# Patient Record
Sex: Female | Born: 1975 | Race: White | Hispanic: Yes | Marital: Married | Smoking: Never smoker
Health system: Southern US, Community
[De-identification: ages and names within clinical notes are randomized; demographics above are authoritative.]

## PROBLEM LIST (undated history)

## (undated) DIAGNOSIS — Z789 Other specified health status: Secondary | ICD-10-CM

## (undated) DIAGNOSIS — T7840XA Allergy, unspecified, initial encounter: Secondary | ICD-10-CM

## (undated) DIAGNOSIS — K649 Unspecified hemorrhoids: Secondary | ICD-10-CM

## (undated) DIAGNOSIS — F32A Depression, unspecified: Secondary | ICD-10-CM

## (undated) HISTORY — DX: Allergy, unspecified, initial encounter: T78.40XA

## (undated) HISTORY — PX: OTHER SURGICAL HISTORY: SHX169

## (undated) HISTORY — DX: Unspecified hemorrhoids: K64.9

## (undated) HISTORY — DX: Depression, unspecified: F32.A

---

## 1999-01-01 ENCOUNTER — Other Ambulatory Visit: Admission: RE | Admit: 1999-01-01 | Discharge: 1999-01-01 | Payer: Self-pay | Admitting: Gynecology

## 1999-01-30 ENCOUNTER — Encounter: Admission: RE | Admit: 1999-01-30 | Discharge: 1999-04-30 | Payer: Self-pay | Admitting: Gynecology

## 1999-02-26 ENCOUNTER — Emergency Department (HOSPITAL_COMMUNITY): Admission: EM | Admit: 1999-02-26 | Discharge: 1999-02-26 | Payer: Self-pay | Admitting: Emergency Medicine

## 1999-02-26 ENCOUNTER — Encounter: Payer: Self-pay | Admitting: Emergency Medicine

## 1999-08-17 ENCOUNTER — Inpatient Hospital Stay (HOSPITAL_COMMUNITY): Admission: AD | Admit: 1999-08-17 | Discharge: 1999-08-19 | Payer: Self-pay | Admitting: Gynecology

## 1999-09-30 ENCOUNTER — Other Ambulatory Visit: Admission: RE | Admit: 1999-09-30 | Discharge: 1999-09-30 | Payer: Self-pay | Admitting: Gynecology

## 2000-12-25 ENCOUNTER — Other Ambulatory Visit: Admission: RE | Admit: 2000-12-25 | Discharge: 2000-12-25 | Payer: Self-pay | Admitting: Gynecology

## 2001-01-13 ENCOUNTER — Other Ambulatory Visit: Admission: RE | Admit: 2001-01-13 | Discharge: 2001-01-13 | Payer: Self-pay | Admitting: Gynecology

## 2002-03-23 ENCOUNTER — Other Ambulatory Visit: Admission: RE | Admit: 2002-03-23 | Discharge: 2002-03-23 | Payer: Self-pay | Admitting: Gynecology

## 2003-06-20 ENCOUNTER — Other Ambulatory Visit: Admission: RE | Admit: 2003-06-20 | Discharge: 2003-06-20 | Payer: Self-pay | Admitting: Gynecology

## 2003-08-23 ENCOUNTER — Other Ambulatory Visit: Admission: RE | Admit: 2003-08-23 | Discharge: 2003-08-23 | Payer: Self-pay | Admitting: Gynecology

## 2004-03-25 ENCOUNTER — Inpatient Hospital Stay (HOSPITAL_COMMUNITY): Admission: AD | Admit: 2004-03-25 | Discharge: 2004-03-28 | Payer: Self-pay | Admitting: Gynecology

## 2004-05-03 ENCOUNTER — Other Ambulatory Visit: Admission: RE | Admit: 2004-05-03 | Discharge: 2004-05-03 | Payer: Self-pay | Admitting: Gynecology

## 2004-06-25 ENCOUNTER — Observation Stay (HOSPITAL_COMMUNITY): Admission: AD | Admit: 2004-06-25 | Discharge: 2004-06-26 | Payer: Self-pay | Admitting: Gynecology

## 2004-06-25 ENCOUNTER — Ambulatory Visit (HOSPITAL_BASED_OUTPATIENT_CLINIC_OR_DEPARTMENT_OTHER): Admission: RE | Admit: 2004-06-25 | Discharge: 2004-06-25 | Payer: Self-pay | Admitting: Gynecology

## 2005-05-21 ENCOUNTER — Other Ambulatory Visit: Admission: RE | Admit: 2005-05-21 | Discharge: 2005-05-21 | Payer: Self-pay | Admitting: Gynecology

## 2006-03-23 ENCOUNTER — Ambulatory Visit (HOSPITAL_COMMUNITY): Admission: RE | Admit: 2006-03-23 | Discharge: 2006-03-23 | Payer: Self-pay | Admitting: Internal Medicine

## 2008-08-18 ENCOUNTER — Ambulatory Visit (HOSPITAL_COMMUNITY): Admission: RE | Admit: 2008-08-18 | Discharge: 2008-08-18 | Payer: Self-pay | Admitting: Internal Medicine

## 2010-09-08 ENCOUNTER — Encounter: Payer: Self-pay | Admitting: Internal Medicine

## 2011-01-03 NOTE — Discharge Summary (Signed)
NAMESAUDIA, SMYSER                          ACCOUNT NO.:  [id number]   MEDICAL RECORD NO.:  [id number]                   PATIENT TYPE:  INP   LOCATION:  9115                                 FACILITY:  [facility]   PHYSICIAN:  Juan H. Lily Peer, M.D.             DATE OF BIRTH:  [birth year]   DATE OF ADMISSION:  03/25/2004  DATE OF DISCHARGE:                                 DISCHARGE SUMMARY   HISTORY:  The patient is a 35 year old gravida 4 para 2 AB 1 at 40-and-a-  half weeks estimated gestational age who was admitted on March 26, 2004; was  in early labor and was augmented with Pitocin.  On the morning of August 9  her cervix was 3-4 cm, 80% effaced, -2 station.  She underwent artificial  rupture of membranes with clear amniotic fluid.  She had a GBS culture which  was negative.  She went on to have a normal spontaneous vaginal delivery on  the morning of August 9 at 1010, a viable female infant with Apgars of 9 and  10, with a weight of 8 pounds 10 ounces and length of 54 cm.  The patient's  postpartum course was uneventful.  Her blood type was B positive, rubella  immune.  Hemoglobin and hematocrit were 12.7 and 38.1 respectively with a  platelet count of 207,000.  She was ready to be discharged home on  postpartum day #2.  She was complaining of a little bit of pruritus as a  result of perhaps the Duramorph and she was given Benadryl 25 mg p.o. before  being discharged home.   FINAL DISPOSITION AND FOLLOW-UP:  A 35 year old gravida 4, now para 3 AB 1  status post normal spontaneous vaginal delivery, uneventful postpartum  course, was ready for discharge home.  Prescription for Motrin 800 mg to  take one p.o. t.i.d. was prescribed as well as prenatal chewable vitamins.  She will make an appointment to follow up with [facility] in 6 weeks for a  postpartum visit.                                               Juan H. Lily Peer, M.D.    JHF/MEDQ  D:  03/28/2004  T:  03/28/2004  Job:  [id number]

## 2011-01-24 ENCOUNTER — Other Ambulatory Visit: Payer: Self-pay | Admitting: Specialist

## 2011-01-24 DIAGNOSIS — R102 Pelvic and perineal pain: Secondary | ICD-10-CM

## 2011-01-27 ENCOUNTER — Other Ambulatory Visit: Payer: Self-pay

## 2011-01-28 ENCOUNTER — Ambulatory Visit
Admission: RE | Admit: 2011-01-28 | Discharge: 2011-01-28 | Disposition: A | Discharge status: Home / Self Care | Payer: Self-pay | Source: Ambulatory Visit | Attending: Specialist | Admitting: Specialist

## 2011-01-28 ENCOUNTER — Ambulatory Visit
Admission: RE | Admit: 2011-01-28 | Discharge: 2011-01-28 | Disposition: A | Discharge status: Home / Self Care | Payer: No Typology Code available for payment source | Source: Ambulatory Visit | Attending: Specialist | Admitting: Specialist

## 2011-01-28 DIAGNOSIS — R102 Pelvic and perineal pain: Secondary | ICD-10-CM

## 2011-06-26 ENCOUNTER — Encounter (HOSPITAL_COMMUNITY): Payer: Self-pay

## 2011-06-26 ENCOUNTER — Inpatient Hospital Stay (HOSPITAL_COMMUNITY)
Admission: AD | Admit: 2011-06-26 | Discharge: 2011-06-26 | Disposition: A | Discharge status: Home / Self Care | Payer: Self-pay | Source: Ambulatory Visit | Attending: Obstetrics | Admitting: Obstetrics

## 2011-06-26 ENCOUNTER — Inpatient Hospital Stay (HOSPITAL_COMMUNITY): Payer: Self-pay

## 2011-06-26 DIAGNOSIS — O209 Hemorrhage in early pregnancy, unspecified: Secondary | ICD-10-CM | POA: Insufficient documentation

## 2011-06-26 HISTORY — DX: Other specified health status: Z78.9

## 2011-06-26 LAB — URINE MICROSCOPIC-ADD ON

## 2011-06-26 LAB — WET PREP, GENITAL
Clue Cells Wet Prep HPF POC: NONE SEEN
Trich, Wet Prep: NONE SEEN
Yeast Wet Prep HPF POC: NONE SEEN

## 2011-06-26 LAB — CBC
HCT: 38.7 % (ref 36.0–46.0)
Hemoglobin: 13.1 g/dL (ref 12.0–15.0)
MCH: 30.3 pg (ref 26.0–34.0)
MCHC: 33.9 g/dL (ref 30.0–36.0)
MCV: 89.4 fL (ref 78.0–100.0)
RDW: 13.5 % (ref 11.5–15.5)

## 2011-06-26 LAB — URINALYSIS, ROUTINE W REFLEX MICROSCOPIC
Bilirubin Urine: NEGATIVE
Glucose, UA: NEGATIVE mg/dL
Protein, ur: NEGATIVE mg/dL
Urobilinogen, UA: 0.2 mg/dL (ref 0.0–1.0)

## 2011-06-26 LAB — POCT PREGNANCY, URINE: Preg Test, Ur: POSITIVE

## 2011-06-26 NOTE — Progress Notes (Signed)
Pt informed of Dr. Ambrose Mantle  plan of care to admitted to hospital and monitor b/p pt does not want to be admitted. Informed pt that with b/p elevated,there is chance that the baby and herself could be in danger and  possibly die. Pt verbalized understanding but states "I can't stay in the hospital I have to work." Pt states she would like a minute to discuss with the FOB.

## 2011-06-26 NOTE — Progress Notes (Signed)
Patient states she started having some pink spotting with wiping at 0500 this am. No blood on a pad and patient states no pain.

## 2011-06-26 NOTE — ED Provider Notes (Signed)
History   Pt presents today c/o vag spotting that she first noticed this am. She denies abd pain, vag dc, irritation, or recent intercourse. She has no other complaints at this time.  Chief Complaint  Patient presents with  . Vaginal Bleeding   HPI  OB History    Grav Para Term Preterm Abortions TAB SAB Ect Mult Living   5 3   1  1   3       No past medical history on file.  No past surgical history on file.  No family history on file.  History  Substance Use Topics  . Smoking status: Not on file  . Smokeless tobacco: Not on file  . Alcohol Use: Not on file    Allergies: No Known Allergies  Prescriptions prior to admission  Medication Sig Dispense Refill  . guaifenesin (ROBITUSSIN) 100 MG/5ML syrup Take 200 mg by mouth every 6 (six) hours as needed. cough       . prenatal vitamin w/FE, FA (PRENATAL 1 + 1) 27-1 MG TABS Take 1 tablet by mouth daily.        . pseudoephedrine (SUDAFED) 30 MG tablet Take 30 mg by mouth every 8 (eight) hours as needed. congestion       . sodium chloride (OCEAN) 0.65 % nasal spray Place 1 spray into the nose as needed. congestion         Review of Systems  Constitutional: Negative for fever.  Cardiovascular: Negative for chest pain.  Gastrointestinal: Negative for nausea, vomiting, abdominal pain, diarrhea and constipation.  Genitourinary: Negative for dysuria, urgency, frequency and hematuria.  Neurological: Negative for dizziness and headaches.  Psychiatric/Behavioral: Negative for depression and suicidal ideas.   Physical Exam   Blood pressure 128/70, pulse 76, temperature 98.1 F (36.7 C), temperature source Oral, resp. rate 20, height 5' 0.5" (1.537 m), weight 160 lb 6.4 oz (72.757 kg), last menstrual period 04/04/2011, SpO2 99.00%.  Physical Exam  Nursing note and vitals reviewed. Constitutional: She is oriented to person, place, and time. She appears well-developed and well-nourished. No distress.  HENT:  Head: Normocephalic and  atraumatic.  Eyes: EOM are normal. Pupils are equal, round, and reactive to light.  GI: Soft. She exhibits no distension. There is no tenderness. There is no rebound and no guarding.  Genitourinary: There is bleeding around the vagina. Vaginal discharge found.       Cervix Lg/closed.  Neurological: She is alert and oriented to person, place, and time.  Skin: Skin is warm and dry. She is not diaphoretic.  Psychiatric: She has a normal mood and affect. Her behavior is normal. Judgment and thought content normal.    MAU Course  Procedures  Wet prep and GC/Chlamydia cultures done.  Results for orders placed during the hospital encounter of 06/26/11 (from the past 24 hour(s))  URINALYSIS, ROUTINE W REFLEX MICROSCOPIC     Status: Abnormal   Collection Time   06/26/11  2:02 PM      Component Value Range   Color, Urine STRAW (*) YELLOW    Appearance HAZY (*) CLEAR    Specific Gravity, Urine <1.005 (*) 1.005 - 1.030    pH 6.0  5.0 - 8.0    Glucose, UA NEGATIVE  NEGATIVE (mg/dL)   Hgb urine dipstick MODERATE (*) NEGATIVE    Bilirubin Urine NEGATIVE  NEGATIVE    Ketones, ur NEGATIVE  NEGATIVE (mg/dL)   Protein, ur NEGATIVE  NEGATIVE (mg/dL)   Urobilinogen, UA 0.2  0.0 - 1.0 (  mg/dL)   Nitrite NEGATIVE  NEGATIVE    Leukocytes, UA NEGATIVE  NEGATIVE   URINE MICROSCOPIC-ADD ON     Status: Abnormal   Collection Time   06/26/11  2:02 PM      Component Value Range   Squamous Epithelial / LPF MANY (*) RARE    RBC / HPF 3-6  <3 (RBC/hpf)  POCT PREGNANCY, URINE     Status: Normal   Collection Time   06/26/11  2:34 PM      Component Value Range   Preg Test, Ur POSITIVE    CBC     Status: Normal   Collection Time   06/26/11  3:19 PM      Component Value Range   WBC 6.5  4.0 - 10.5 (K/uL)   RBC 4.33  3.87 - 5.11 (MIL/uL)   Hemoglobin 13.1  12.0 - 15.0 (g/dL)   HCT 04.5  40.9 - 81.1 (%)   MCV 89.4  78.0 - 100.0 (fL)   MCH 30.3  26.0 - 34.0 (pg)   MCHC 33.9  30.0 - 36.0 (g/dL)   RDW 91.4   78.2 - 95.6 (%)   Platelets 229  150 - 400 (K/uL)  ABO/RH     Status: Normal   Collection Time   06/26/11  3:19 PM      Component Value Range   ABO/RH(D) B POS    HCG, QUANTITATIVE, PREGNANCY     Status: Abnormal   Collection Time   06/26/11  3:19 PM      Component Value Range   hCG, Beta Chain, Quant, S 8910 (*) <5 (mIU/mL)  WET PREP, GENITAL     Status: Abnormal   Collection Time   06/26/11  4:00 PM      Component Value Range   Yeast, Wet Prep NONE SEEN  NONE SEEN    Trich, Wet Prep NONE SEEN  NONE SEEN    Clue Cells, Wet Prep NONE SEEN  NONE SEEN    WBC, Wet Prep HPF POC FEW (*) NONE SEEN    US shows irregular gestational sac with associated decidual reaction but no yolk sac or fetal pole seen. Suspicious for blighted ovum.  Assessment and Plan  Bleeding in preg: discussed with pt at length. Will have pt return in 2 days for repeat B-hcg. Discussed diet, activity, risks, and precautions.  Clinton Gallant. Leor Whyte III, DrHSc, MPAS, PA-C  06/26/2011, 3:56 PM   Henrietta Hoover, PA 06/26/11 1616

## 2011-06-28 ENCOUNTER — Inpatient Hospital Stay (HOSPITAL_COMMUNITY)
Admission: AD | Admit: 2011-06-28 | Discharge: 2011-06-28 | Disposition: A | Discharge status: Home / Self Care | Payer: No Typology Code available for payment source | Source: Ambulatory Visit | Attending: Obstetrics | Admitting: Obstetrics

## 2011-06-28 DIAGNOSIS — O021 Missed abortion: Secondary | ICD-10-CM | POA: Insufficient documentation

## 2011-06-28 NOTE — Progress Notes (Signed)
Patient reports still bleeding noted when goes to the bathroom some pain in the back area here for follow up blood work.

## 2011-06-28 NOTE — ED Provider Notes (Signed)
History     CSN: [id number] Arrival date & time: 06/28/2011  7:36 AM   None     Chief Complaint  Patient presents with  . Vaginal Bleeding    (Consider location/radiation/quality/duration/timing/severity/associated sxs/prior treatment) HPIElisabeth N Coffey  Is a 35 y.o. [id number]. She returns for repeat BHCG. First MAU visit 11/8 for spotting. BHCG was 8910. U/S revealed 8 3/7 wk irregular IUGS, no yolk sac or fetal pole. Blighted ovum suspected. Pt returns today, still has scant bleeding, no pain or cramping.  Past Medical History  Diagnosis Date  . No pertinent past medical history     Past Surgical History  Procedure Date  . Left ovarian cyst     No family history on file.  History  Substance Use Topics  . Smoking status: Not on file  . Smokeless tobacco: Not on file  . Alcohol Use: No    OB History    Grav Para Term Preterm Abortions TAB SAB Ect Mult Living   5 3   1  1   3       Review of Systems  Allergies  Review of patient's allergies indicates no known allergies.  Home Medications  No current outpatient prescriptions on file.  BP 114/70  Pulse 67  Temp(Src) 98.7 F (37.1 C) (Oral)  Resp 16  LMP 04/04/2011  Physical Exam WD WN HF VSS  ED Course  Procedures (including critical care time)  Labs Reviewed  HCG, QUANTITATIVE, PREGNANCY - Abnormal; Notable for the following:    hCG, Beta Chain, Quant, S 8089 (*)    All other components within normal limits   US Ob Comp Less 14 Wks  06/26/2011  OBSTETRICAL ULTRASOUND: This exam was performed within a [facility]. The OB US report was generated in the [facility] system, and faxed to the ordering physician.   This report is also available in [employer] and in the [employer]. See [facility] Obstetric US report.   US Ob Transvaginal  06/26/2011  OBSTETRICAL ULTRASOUND: This exam was performed within a [facility]. The OB US report was  generated in the [facility] system, and faxed to the ordering physician.   This report is also available in [employer] and in the [employer]. See [facility] Obstetric US report.     No diagnosis found.   Results discussed with the pt. BHCG went from 8910 to 8089. Course of miscarriage reviewed. Pt to return with heavy bleeding, pain or fever >100.5.  Will get results to Dr Elsie Stain office and he can schedule her for [facility]. Pt is in agreement with this plan. MDM          Avon Gully. Hayden Mabin 06/28/11 1478

## 2012-03-15 ENCOUNTER — Other Ambulatory Visit (HOSPITAL_COMMUNITY): Payer: Self-pay | Admitting: Geriatric Medicine

## 2012-03-15 DIAGNOSIS — Z1231 Encounter for screening mammogram for malignant neoplasm of breast: Secondary | ICD-10-CM

## 2012-04-01 ENCOUNTER — Encounter (HOSPITAL_COMMUNITY): Payer: Self-pay

## 2012-04-01 ENCOUNTER — Ambulatory Visit (HOSPITAL_COMMUNITY)
Admission: RE | Admit: 2012-04-01 | Discharge: 2012-04-01 | Disposition: A | Discharge status: Home / Self Care | Payer: Self-pay | Source: Ambulatory Visit | Attending: Geriatric Medicine | Admitting: Geriatric Medicine

## 2012-04-01 DIAGNOSIS — Z1231 Encounter for screening mammogram for malignant neoplasm of breast: Secondary | ICD-10-CM

## 2012-07-07 ENCOUNTER — Ambulatory Visit (INDEPENDENT_AMBULATORY_CARE_PROVIDER_SITE_OTHER): Payer: Self-pay | Admitting: Family Medicine

## 2012-07-07 ENCOUNTER — Encounter: Payer: Self-pay | Admitting: Family Medicine

## 2012-07-07 VITALS — BP 124/74 | Wt 149.0 lb

## 2012-07-07 DIAGNOSIS — O09519 Supervision of elderly primigravida, unspecified trimester: Secondary | ICD-10-CM

## 2012-07-07 DIAGNOSIS — O0990 Supervision of high risk pregnancy, unspecified, unspecified trimester: Secondary | ICD-10-CM | POA: Insufficient documentation

## 2012-07-07 DIAGNOSIS — Z23 Encounter for immunization: Secondary | ICD-10-CM

## 2012-07-07 DIAGNOSIS — Z3480 Encounter for supervision of other normal pregnancy, unspecified trimester: Secondary | ICD-10-CM

## 2012-07-07 DIAGNOSIS — Z1151 Encounter for screening for human papillomavirus (HPV): Secondary | ICD-10-CM

## 2012-07-07 DIAGNOSIS — Z124 Encounter for screening for malignant neoplasm of cervix: Secondary | ICD-10-CM

## 2012-07-07 DIAGNOSIS — Z113 Encounter for screening for infections with a predominantly sexual mode of transmission: Secondary | ICD-10-CM

## 2012-07-07 MED ORDER — INFLUENZA VIRUS VACC SPLIT PF IM SUSP: 0.5000 mL | Freq: Once | INTRAMUSCULAR | Status: DC

## 2012-07-07 NOTE — Patient Instructions (Signed)
Embarazo  Primer trimestre  (Pregnancy - First Trimester)  Durante el acto sexual, millones de espermatozoides entran en la vagina. Slo 1 espermatozoide penetra y fertiliza al vulo mientras se encuentra en la trompa de [place]. Una semana ms tarde, el vulo fertilizado se implanta en la pared del tero. Un embrin comienza a desarrollarse para ser un beb. A las 6 a 8 semanas se forman los ojos y la cara y los latidos del corazn se pueden ver en la ecografa. Al final de las 12 semanas (primer trimestre) todos los rganos del beb estn formados. Ahora que est embarazada, querr hacer todo lo que est a su alcance para tener un beb sano. Dos de las cosas ms importantes son: tener una buena atencin prenatal y seguir las indicaciones del profesional que la asiste. La atencin prenatal incluye toda la asistencia mdica que usted recibe antes del nacimiento del beb. Se lleva a cabo para prevenir y tratar problemas durante el embarazo y el parto. EXAMENES PRENATALES   Durante las visitas prenatales se controlan el peso, la presin arterial y se solicitan anlisis de orina. Esto se hace para asegurarse de que usted est sana y el embarazo progrese normalmente.  Una mujer embarazada debe aumentar de 25 a 35 libras durante el embarazo. Sin embargo, si usted tiene sobrepeso o bajo peso, su mdico le aconsejar qu hacer.  El podr hacerle preguntas y responder todas las que usted le haga.  Durante los exmenes prenatales se solicitan anlisis de sangre, cultivos del tero, un [place] y otros anlisis necesarios. Estas pruebas se realizan para controlar su salud y la del beb. Se recomienda que se haga la prueba para el diagnstico del VIH, con su autorizacin. Este es el virus que causa el SIDA. Estas pruebas se realizan porque existen medicamentos que podran administrarle para prevenir que el beb nazca con esta infeccin, si usted estuviera infectada y no lo supiera. Los anlisis de sangre tambin  se realizan para determinar el tipo de sangre, si tuvo infecciones previas y controlar sus niveles en la sangre (hemoglobina).  Tener un recuento bajo de hemoglobina (anemia) es comn durante el embarazo. Para prevenirla, se administran hierro y vitaminas. En una etapa ms avanzada del embarazo, le indicarn exmenes de sangre para saber si tiene diabetes, junto con otros anlisis, en caso de que tuviera problemas. Es necesario que se haga las pruebas para asegurarse de que usted y el beb estn bien.  Es posible que necesite otras pruebas adicionales. CAMBIOS DURANTE EL PRIMER TRIMESTRE (LOS TRES PRIMEROS MESES DEL EMBARAZO).  Su organismo atravesar numerosos cambios durante el embarazo. Estos pueden variar de una persona a otra. Converse con el mdico acerca los cambios que usted nota y que la preocupan. Ellos son:   El perodo menstrual se detiene.  El vulo y los espermatozoides llevan los genes que determinan cmo seremos. Sus genes y los de su pareja forman el beb. Los genes del varn determinan si ser un nio o una nia.  La circunferencia de la cintura va a ir aumentando y podr sentirse hinchada.  Puede tener malestar estomacal (nuseas) y vmitos. Si no puede controlar los vmitos, consulte a su mdico.  Sus mamas comenzarn a agrandarse y sensibilizarse.  Los pezones pueden sobresalir ms y ser ms oscuros.  Tendr necesidad de orinar ms. El dolor al orinar puede significar que usted tiene una infeccin de la vejiga.  Se cansar con facilidad.  Prdida del apetito.  Sentir un fuerte deseo de consumir ciertos alimentos.    Al principio, usted puede ganar o perder un par de kilos.  Podr tener cambios emocionales de un da a otro (entusiasmo por estar embarazada o preocupacin por el embarazo y el beb).  Tendr sueos ms vvidos y extraos. INSTRUCCIONES PARA EL CUIDADO EN EL HOGAR   Es muy importante evitar el cigarrillo, el alcohol y los frmacos no recetados  durante el embarazo. Estas sustancias afectan la formacin y el desarrollo del beb. Evite los productos qumicos durante el embarazo para asegurar la salud del beb.  Comience las consultas prenatales alrededor de la 12 semana de embarazo. Generalmente se programan cada mes al principio y se hacen ms frecuentes en los 2 ltimos meses antes del parto. Cumpla con las citas de control. Siga las indicaciones del mdico con respecto al uso de medicamentos, los anlisis y pruebas de laboratorio, los ejercicios y la dieta.  Durante el embarazo debe obtener nutrientes para usted y para su beb. Consuma alimentos balanceados. Elija alimentos como carne, pescado, leche y otros productos lcteos descremados, vegetales, frutas, panes integrales y cereales. El mdico le informar cul es el aumento de peso ideal.  Las nuseas matinales pueden aliviarse si come algunas galletitas saladas en la cama. Coma dos galletitas antes de levantarse por la maana. Tambin puede comer galletitas sin sal.  Hacer 4 o 5 comidas pequeas en lugar de 3 comidas grandes por da tambin puede aliviar las nuseas y los vmitos.  Beber lquidos entre las comidas en lugar de tomarlos durante las comidas tambin puede ayudar a calmar las nuseas y los vmitos.  Puede continuar teniendo relaciones sexuales durante todo el embarazo si no hay otros problemas. Los problemas pueden ser una prdida precoz (prematura) de lquido amnitico, sangrado vaginal, o dolor en el vientre (abdominal).  Realice actividad fsica todos los das, si no tiene restricciones. Consulte con su mdico o terapeuta fsico si no est segura de algunos de sus ejercicios. El mayor aumento de peso se producir en los ltimos 2 trimestres del embarazo. El ejercicio le ayudar a:  Controlar su peso.  Mantenerse en forma.  Prepararse para el parto.  La ayudar a perder el peso del embarazo despus de que nazca su beb.  Use un buen sostn o como los que se usan  para hacer deportes para aliviar la sensibilidad de las mamas. Tambin puede serle til si lo usa mientras duerme.  Consulte cuando puede comenzar con las clases de pre parto. Comience con las clases cuando estn disponibles.  No utilice la baera con agua caliente, baos turcos y saunas.  Colquese el cinturn de seguridad cuando conduzca. Este la proteger a usted y al beb en caso de accidente.  Evite comer carne cruda y el contacto con los utensilios y desperdicios de los gatos. Estos elementos contienen grmenes que pueden causar defectos de nacimiento en el beb.  El primer trimestre es un buen momento para visitar a su dentista y evaluar su salud dental. Es importante mantener los dientes limpios. Use un cepillo de dientes suave y cepllese con ms suavidad durante el embarazo.  Pida ayuda si tienen necesidades financieras, teraputicas o nutricionales. El profesional podr ayudarla con respecto a estas necesidades, o derivarla a otros especialistas.  No tome medicamentos o hierbas excepto aquellos que le indic el profesional.  Informe a su mdico si sufre violencia familiar mental o fsica.  Haga una lista de nmeros de telfono de emergencia de la familia, los amigos, el hospital y los departamentos de polica y bomberos.  Escriba sus   preguntas. Llvelas cuando concurra a su visita prenatal.  No se haga duchas vaginales.  No cruce las piernas.  Si usted tiene que estar parada por largos perodos de tiempo, gire los pies o de pequeos pasos en crculo.  Es posible que tenga ms secreciones vaginales que puedan requerir una toalla higinica. No use tampones o toallas higinicas perfumadas. EL CONSUMO DE MEDICAMENTOS Y FRMACOS DURANTE EL EMBARAZO   Tome las vitaminas para la etapa prenatal tal como se le indic. Las vitaminas deben contener un miligramo de cido flico. Guarde todas las vitaminas fuera del alcance de los nios. La ingestin de slo un par de vitaminas o  tabletas que contengan hierro pueden ocasionar la muerte en un beb o en un nio pequeo.  Evite el uso de todos los medicamentos, incluyendo hierbas, medicamentos de venta libre, sin receta o que no hayan sido sugeridos por su mdico. Slo tome medicamentos de venta libre o medicamentos recetados para el dolor, el malestar o fiebre como lo indique su mdico. No tome aspirina, ibuprofeno o naproxeno excepto que su mdico se lo indique.  Infrmele al profesional si consume medicamentos de hierbas.  El alcohol se relaciona con ciertos defectos congnitos. Incluye el sndrome de alcoholismo fetal. Debe evitar absolutamente el consumo de alcohol, en cualquier forma. El fumar causar baja tasa de natalidad y bebs prematuros.  Las drogas ilegales o de la calle son muy perjudiciales para el beb. Estn absolutamente prohibidas. Un beb que nace de una madre adicta, ser adicto al nacer. Ese beb tendr los mismos sntomas de abstinencia que un adulto.  Informe a su mdico acerca de los medicamentos que ha tomado y el motivo por el que los tom. EL ABORTO ESPONTNEO ES COMN DURANTE EL EMBARAZO  Esto no significa que haya hecho algo mal. No es un motivo para preocuparse en caso de un nuevo embarazo. El profesional que la asiste la ayudar si tiene preguntas para formular. Si tiene un aborto espontneo podr requerir una ciruga menor.  SOLICITE ATENCIN MDICA SI:  Tiene preguntas o preocupaciones relacionadas con el embarazo. Es mejor que llame para formular las preguntas si no puede esperar hasta la prxima visita, que sentirse preocupada por ellas.  SOLICITE ATENCIN MDICA DE INMEDIATO SI:   La temperatura oral le sube a ms de 102 F (38.9 C) o lo que su mdico le indique.  Tiene una prdida de lquido por la vagina (canal de parto). Si sospecha una ruptura de las membranas, tmese la temperatura y llame al profesional para informarlo sobre esto.  Observa unas pequeas manchas o una hemorragia  vaginal. Notifique al profesional acerca de la cantidad y de cuntos apsitos est utilizando.  Presenta un olor desagradable en la secrecin vaginal y observa un cambio en el color.  Contina con las nuseas y no obtiene alivio de los remedios indicados. Vomita sangre o algo similar a la borra del caf.  Pierde ms de 2 libras (1 Kg) en una semana.  Aumenta ms de 1 Kg en una semana y nota el rostro, las manos, los pies o las piernas hinchados.  Aumenta ms de 2,5 Kg en una semana (aunque no tenga las manos, pies, piernas o el rostro hinchados).  Ha estado expuesta a la rubola y no ha sufrido la enfermedad.  Ha estado expuesta a la quinta enfermedad o a la varicela.  Siente dolor en el vientre (abdominal). Las molestias en el ligamento redondo son una causa benigna frecuente de dolor abdominal durante el embarazo. El   profesional que la asiste deber evaluarla.  Presenta dolor de Renne Musca, diarrea, dolor al orinar o le falta la respiracin.  Se cae, se ve involucrada en un accidente automovilstico o sufre algn tipo de traumatismo.  En su hogar hay violencia mental o fsica. Document Released: 05/14/2005 Document Revised: 02/03/2012 [facility] Patient Information 2013 [place], [place].  Lactancia materna  (Breastfeeding) Decidir Museum/gallery exhibitions officer es una de las mejores elecciones que puede hacer por usted y su beb. La informacin que se brinda a Psychologist, clinical dar una breve visin de los beneficios de la lactancia materna as como de las dudas ms frecuentes alrededor de ella.  LOS BENEFICIOS DE AMAMANTAR  Para el beb   La primera leche (calostro ) ayuda al mejor funcionamiento del sistema digestivo del beb.   La leche tiene anticuerpos que provienen de la madre y que ayudan a prevenir las infecciones en el beb.   El beb tiene una menor incidencia de asma, alergias y del sndrome de muerte sbita del lactante (SMSL).   Los nutrientes en la Dresser materna son  mejores para el beb que los preparados para lactantes y la [place] materna ayuda a un mejor desarrollo del cerebro del beb.   Los bebs amamantados tienen menos gases, clicos y estreimiento.  Para la mam   La lactancia materna favorece el desarrollo de un vnculo muy especial entre la madre y el beb.   Es ms conveniente, siempre disponible y a Landscape architect y Film/video editor.   Consume caloras en la madre y la ayuda a perder el peso ganado durante el [place].   Favorece la contraccin del tero a su tamao normal, de manera ms rpida y [employer] las hemorragias luego del [place].   Las [employer] que amamantan tienen menor riesgo de Geophysical data processor de mama.  FRECUENCIA DEL AMAMANTAMIENTO   Un beb sano, nacido a trmino, puede amamantarse con tanta frecuencia como cada hora, o espaciar las comidas cada tres horas.   Observe al beb cuando manifieste signos de hambre. Amamante a su beb si muestra signos de hambre. Esta frecuencia variar de un beb a otro.   Amamntelo tan seguido como el beb lo solicite, o cuando usted sienta la necesidad de Paramedic sus [place].   Despierte al beb si han pasado 3  4 horas desde la ltima comida.   El amamantamiento frecuente la ayudar a producir ms [place] y a Education officer, community de Engineer, mining en los pezones e hinchazn de las [place].  POSICIN DEL BEBE PARA EL AMAMANTAMIENTO   Ya sea que se encuentre [place] o sentada, asegrese que el abdomen del beb enfrente el suyo.   Sostenga la mama con el pulgar por arriba y los otros 4 dedos por debajo. Asegrese que sus dedos se encuentren lejos del pezn y de la boca del beb.   Empuje suavemente los labios del beb con el pezn o con el dedo.   Cuando la boca del beb se abra lo suficiente, introduzca el pezn y la areola tanto como le sea posible dentro de la boca.   Coloque al beb cerca suyo de modo que su nariz y mejillas toquen las mamas al [employer].  ALIMENTACIN Y SUCCIN    La duracin de cada comida vara de un beb a otro y de Neomia Dear comida a [place].   El beb debe succionar entre 2 y 3 minutos para que Development worker, international aid. Esto se denomina "bajada". Por este motivo, permita que el nio se alimente en cada mama todo lo que  desee. Terminar de mamar cuando haya recibido la cantidad [place] de nutrientes.   Para detener la succin coloque su dedo en la comisura de la boca del nio y Midwife entre sus encas antes de quitarle la mama de la boca. Esto la ayudar a English as a second language teacher.  COMO SABER SI EL BEB OBTIENE LA SUFICIENTE LECHE MATERNA  Preguntarse si el beb obtiene la cantidad suficiente de [place] es una preocupacin frecuente [employer]. Puede asegurarse que el beb tiene la leche suficiente si:   El beb succiona activamente y usted escucha que traga .   El beb parece estar relajado y satisfecho despus de Psychologist, clinical.   El nio se alimenta al menos 8 a 12 veces en 24 horas. Alimntelo hasta que se desprenda por sus propios medios o se quede dormido en la primera mama (al menos durante 10 a 20 minutos), luego ofrzcale el otro lado.   El beb moja 5 a 6 paales desechables (6 a 8 paales de tela) en 24 horas cuando tiene 5  6 das de vida.   Tiene al [employer] 3 a 4 deposiciones todos los [employer] primeros meses. La materia fecal debe ser blanda y [place].   El beb debe aumentar 4 a 6 libras (120 a 170 gr.) por semana despus de los 4 [address] de vida.   Siente que las mamas se ablandan despus de amamantar  REDUCIR LA CONGESTIN DE LAS MAMAS   Durante la primera semana despus del [place], usted puede experimentar hinchazn en las mamas. Cuando las mamas estn congestionadas, se sienten calientes, llenas y molestas al tacto. Puede reducir la congestin si:   Lo amamanta frecuentemente, cada 2-3 horas. Las mams que [employer] pronto y con frecuencia tienen menos problemas de [place].   Coloque compresas de hielo en sus mamas  durante 10-20 minutos entre cada amamantamiento. Esto ayuda a Building services engineer. Envuelva las bolsas de hielo en una toalla liviana para proteger su piel. Las bolsas de vegetales congelados funcionan bien para este propsito.   Tome una ducha tibia o aplique compresas hmedas calientes en las mamas durante 5 a 10 minutos antes de cada vez que [place]. Esto aumenta la circulacin y [place] a que la [place].   Masajee suavemente la mama antes y Psychologist, sport and exercise. Con las puntas de los dedos, masajee desde la pared torcica hacia abajo hasta llegar al pezn, con movimientos circulares.   Asegrese que el nio vaca al menos una mama antes de cambiar de lado.   Use un sacaleche para vaciar la mama si el beb se duerme o no se alimenta bien. Tambin podr [employer] con esa bomba si tiene que volver al trabajo o siente que las mamas estn congestionadas.   Evite los biberones, chupetes o complementar la alimentacin con agua o jugos en lugar de la [place]. La leche materna es todo el alimento que el beb necesita. No es necesario que el nio ingiera agua o preparados de bibern. Louann Liv, es lo mejor para ayudar a que las mamas produzcan ms [place]. no darle suplementos al [employer] las primeras semanas.   Verifique que el beb se encuentra en la posicin correcta mientras lo alimenta.   Use un sostn que soporte bien sus mamas y [employer] que tienen aro.   Consuma una dieta balanceada y beba lquidos en cantidad.   Descanse con frecuencia, reljese y tome sus vitaminas prenatales para evitar la fatiga, el estrs y  la anemia.  Si sigue estas indicaciones, la congestin debe mejorar en 24 a 48 horas. Si an tiene dificultades, consulte a Barista.  CUDESE USTED MISMA  Cuide sus mamas.   Bese o dchese diariamente.   Evite usar [employer].   Comience a amamantar del lado izquierdo en una comida y del lado derecho en la siguiente.    Notar que aumenta el flujo de [place] a los 2 a 5 [address]  [address] despus del [address]. Puede sentir algunas molestias por la congestin, lo que hace que sus mamas estn duras y sensibles. La congestin disminuye en 24 a 48 horas. Mientras tanto, aplique toallas hmedas calientes durante 5 a 10 minutos antes de amamantar. Un masaje suave y la extraccin de un poco de leche antes de Museum/gallery exhibitions officer ablandarn las mamas y har ms fcil que el beb se agarre.   Use un buen sostn y seque al aire los pezones durante 3 a 4 minutos luego de Wellsite geologist.   Solo utilice apsitos de algodn.   Utilice lanolina [employer] pezones luego de [place]. No necesita lavarlos luego de alimentar al [employer]. Otra opcin es exprimir algunas gotas de [place] y [employer] pezones.  Cumpla con estos cuidados   Consuma alimentos bien balanceados y refrigerios nutritivos.   Dixie Dials, jugos de fruta y agua para Warehouse manager sed (alrededor de 8 vasos por Futures trader).   Descanse lo suficiente.  Evite los alimentos que usted note que pueden afectar al beb.  SOLICITE ATENCIN MDICA SI:   Tiene dificultad con la lactancia materna y [place].   Tiene una zona de color rojo, dura y dolorosa en la mama que se acompaa de [place].   El beb est muy somnoliento como para alimentarse bien o tiene problemas para dormir.   Su beb moja menos de 6 paales al [address], a los 5 [address]  [address] de vida.   La piel del beb o la parte blanca de sus ojos est ms amarilla de lo que estaba en el hospital.   Se siente deprimida.  Document Released: 08/04/2005 Document Revised: 02/03/2012 [facility] Patient Information 2013 [place], [place].

## 2012-07-07 NOTE — Progress Notes (Signed)
   Subjective:    Barbara Coffey is a [id number] [redacted]w[redacted]d being seen today for her first obstetrical visit.  Her obstetrical history is significant for advanced maternal age. Patient does intend to breast feed. Pregnancy history fully reviewed.  Patient reports headache and nausea.  Filed Vitals:   07/07/12 1531  BP: 124/74  Weight: 149 lb (67.586 kg)    HISTORY: OB History    Grav Para Term Preterm Abortions TAB SAB Ect Mult Living   6 3 3  2  2   3      # Outc Date GA Lbr Len/2nd Wgt Sex Del Anes PTL Lv   1 SAB  [redacted]w[redacted]d          2 TRM  [redacted]w[redacted]d  7lb8oz(3.402kg) F SVD      3 TRM  [redacted]w[redacted]d  7lb15oz(3.6kg) F SVD  Yes    4 TRM  [redacted]w[redacted]d  8lb10oz(3.912kg) M SVD      5 SAB  [redacted]w[redacted]d          Comments: System Generated. Please review and update pregnancy details.   6 CUR              Past Medical History  Diagnosis Date  . No pertinent past medical history    Past Surgical History  Procedure Date  . Left ovarian cyst    Family History  Problem Relation Age of Onset  . Hypertension Father      Exam    Uterus:     Pelvic Exam:    Perineum: Normal Perineum   Vulva: normal   Vagina:  normal mucosa       Cervix: multiparous appearance and no lesions   Adnexa: normal adnexa   Bony Pelvis: average  System: Breast:  normal appearance, no masses or tenderness   Skin: normal coloration and turgor, no rashes    Neurologic: oriented   Extremities: normal strength, tone, and muscle mass   HEENT sclera clear, anicteric   Mouth/Teeth mucous membranes moist, pharynx normal without lesions   Neck supple   Cardiovascular: regular rate and rhythm, no murmurs or gallops   Respiratory:  appears well, vitals normal, no respiratory distress, acyanotic, normal RR, ear and throat exam is normal, neck free of mass or lymphadenopathy, chest clear, no wheezing, crepitations, rhonchi, normal symmetric air entry   Abdomen: soft, non-tender; bowel sounds normal; no masses,  no organomegaly          Assessment:    Pregnancy: [id number] Patient Active Problem List  Diagnosis  . Supervision of other normal pregnancy  . Advanced maternal age, primigravida, antepartum        Plan:     Initial labs drawn. Prenatal vitamins. Problem list reviewed and updated. Genetic Screening discussed First Screen: declined.  Ultrasound discussed; fetal survey: discussed.  Follow up in 4 weeks. Declines genetics.  Adopt-A-Mom program--consider U/S at Dr. Elsie Stain office.  PRATT,TANYA S 07/07/2012

## 2012-07-08 LAB — CBC
HCT: 40.3 % (ref 36.0–46.0)
Hemoglobin: 13.1 g/dL (ref 12.0–15.0)
RDW: 14.1 % (ref 11.5–15.5)
WBC: 9.7 10*3/uL (ref 4.0–10.5)

## 2012-07-08 LAB — DIFFERENTIAL
Basophils Absolute: 0 10*3/uL (ref 0.0–0.1)
Basophils Relative: 0 % (ref 0–1)
Lymphocytes Relative: 28 % (ref 12–46)
Monocytes Absolute: 0.5 10*3/uL (ref 0.1–1.0)
Monocytes Relative: 5 % (ref 3–12)
Neutro Abs: 6.3 10*3/uL (ref 1.7–7.7)
Neutrophils Relative %: 65 % (ref 43–77)

## 2012-07-09 LAB — RPR

## 2012-07-09 LAB — ANTIBODY SCREEN: Antibody Screen: NEGATIVE

## 2012-07-09 LAB — ABO

## 2012-07-09 LAB — RUBELLA SCREEN: Rubella: 326.6 IU/mL — ABNORMAL HIGH

## 2012-07-10 LAB — CULTURE, OB URINE
Colony Count: NO GROWTH
Organism ID, Bacteria: NO GROWTH

## 2012-07-28 ENCOUNTER — Ambulatory Visit: Payer: Self-pay | Admitting: Family Medicine

## 2012-07-28 VITALS — BP 100/60 | HR 76 | Temp 98.3°F | Resp 17 | Ht 61.0 in | Wt 154.0 lb

## 2012-07-28 DIAGNOSIS — H60399 Other infective otitis externa, unspecified ear: Secondary | ICD-10-CM

## 2012-07-28 DIAGNOSIS — H6090 Unspecified otitis externa, unspecified ear: Secondary | ICD-10-CM

## 2012-07-28 MED ORDER — ACETIC ACID 2 % OT SOLN: 4.0000 [drp] | Freq: Three times a day (TID) | OTIC | Status: DC

## 2012-07-28 NOTE — Progress Notes (Signed)
[facility] [address], [place] [address] [phone] [phone]  Date:  07/28/2012   Name:  Barbara Coffey   DOB:  1975-08-23   MRN:  [id number]  PCP:  No primary provider on file.    Chief Complaint: Otalgia and Facial Pain   History of Present Illness:  Barbara Coffey is a 36 y.o. very pleasant female patient who presents with the following:  Here to evaluate ear pain. She is also pregnant. Generally healthy.     She has noted pain in her left ear and adjacent cheek/ neck for about 2 days.  She is able to hear normally and there has not been any drainage.  She has not noted a fever.  No cough, no ST.  No other particular symptoms.  She is due on 02/20/13.  Her pregnancy is going well, no bleeding, pain or loss of fluid.   Patient Active Problem List  Diagnosis  . Supervision of other normal pregnancy  . Advanced maternal age, primigravida, antepartum    Past Medical History  Diagnosis Date  . No pertinent past medical history     Past Surgical History  Procedure Date  . Left ovarian cyst     History  Substance Use Topics  . Smoking status: Never Smoker   . Smokeless tobacco: Not on file  . Alcohol Use: No    Family History  Problem Relation Age of Onset  . Hypertension Father     No Known Allergies  Medication list has been reviewed and updated.  Current Outpatient Prescriptions on File Prior to Visit  Medication Sig Dispense Refill  . prenatal vitamin w/FE, FA (PRENATAL 1 + 1) 27-1 MG TABS Take 1 tablet by mouth daily.          Review of Systems:  As per HPI- otherwise negative.   Physical Examination: Filed Vitals:   07/28/12 1038  BP: 126/78  Pulse: 76  Temp: 98.3 F (36.8 C)  Resp: 17   Filed Vitals:   07/28/12 1038  Height: 5\' 1"  (1.549 m)  Weight: 154 lb (69.854 kg)   Body mass index is 29.10 kg/(m^2). Ideal Body Weight: Weight in (lb) to have BMI = 25: 132   GEN: WDWN, NAD, Non-toxic, A & O x  3 HEENT: Atraumatic, Normocephalic. Neck supple. No masses, No LAD.Bilateral TM wnl, oropharynx normal.  PEERL,EOMI.   Left ear canal is sensitive on exam and is slightly damp in appearance.  Tender with movement of left pinna.  Right is normal Ears and Nose: No external deformity. CV: RRR, No M/G/R. No JVD. No thrill. No extra heart sounds. PULM: CTA B, no wheezes, crackles, rhonchi. No retractions. No resp. distress. No accessory muscle use. ABD: S, NT, ND, +BS. No rebound. No HSM. Fundus small, as consistent with dates.  EXTR: No c/c/e NEURO Normal gait.  PSYCH: Normally interactive. Conversant. Not depressed or anxious appearing.  Calm demeanor.    Assessment and Plan: 1. Otitis externa  acetic acid (VOSOL) 2 % otic solution   Mild OE.  Will treat as above.  Counseled her that acetic acid is pregnancy category B and that we believe it is safe for her to use.  If she is not better in the next couple of days please let me know as there could be something else wrong- Sooner if worse.   Meds ordered this encounter  Medications  . acetic acid (VOSOL) 2 % otic solution    Sig:  Place 4 drops into the left ear 3 (three) times daily. Use for one week    Dispense:  15 mL    Refill:  0     COPLAND,JESSICA, MD

## 2012-07-28 NOTE — Patient Instructions (Addendum)
Use the ear drops as directed.  Let me know if you are not better in the next couple of days

## 2012-08-03 ENCOUNTER — Ambulatory Visit (INDEPENDENT_AMBULATORY_CARE_PROVIDER_SITE_OTHER): Payer: Self-pay | Admitting: Family Medicine

## 2012-08-03 VITALS — BP 110/65 | Wt 153.0 lb

## 2012-08-03 DIAGNOSIS — Z3480 Encounter for supervision of other normal pregnancy, unspecified trimester: Secondary | ICD-10-CM

## 2012-08-03 NOTE — Progress Notes (Signed)
Seen at health clinic for ear infection.  She is still hurting in her even though she has been on the drops for one week.  Started with her left ear and now right ear hurts as well.  They told her it was swimmer's ear.

## 2012-08-03 NOTE — Progress Notes (Signed)
Complains of bilateral ear pain.  No pain with manipulation of the ear.  TM's look ok.  May have sinus with fluid advised Sudafed. Schedule anatomy through Dr. Elsie Stain office next visit.

## 2012-08-03 NOTE — Patient Instructions (Signed)
Embarazo - Segundo trimestre (Pregnancy - Second Trimester) El segundo trimestre del embarazo (del 3 al 6mes) es un perodo de evolucin rpida para usted y el beb. Hacia el final del sexto mes, el beb mide aproximadamente 23 cm y pesa 680 g. Comenzar a sentir los movimientos del beb entre las 18 y las 20 semanas de embarazo. Podr sentir las pataditas ("quickening en ingls"). Hay un rpido aumento de peso. Puede segregar un lquido claro (calostro) de las mamas. Quizs sienta pequeas contracciones en el vientre (tero) Esto se conoce como falso trabajo de parto o contracciones de Braxton-Hicks. Es como una prctica del trabajo de parto que se produce cuando el beb est listo para salir. Generalmente los problemas de vmitos matinales ya se han superado hacia el final del primer trimestre. Algunas mujeres desarrollan pequeas manchas oscuras (que se denominan cloasma, mscara del embarazo) en la cara que normalmente se van luego del nacimiento del beb. La exposicin al sol empeora las manchas. Puede desarrollarse acn en algunas mujeres embarazadas, y puede desaparecer en aquellas que ya tienen acn. EXAMENES PRENATALES  Durante los exmenes prenatales, deber seguir realizando pruebas de sangre, segn avance el embarazo. Estas pruebas se realizan para controlar su salud y la del beb. Tambin se realizan anlisis de sangre para conocer los niveles de hemoglobina. La anemia (bajo nivel de hemoglobina) es frecuente durante el embarazo. Para prevenirla, se administran hierro y vitaminas. Tambin se le realizarn exmenes para saber si tiene diabetes entre las 24 y las 28 semanas del embarazo. Podrn repetirle algunas de las pruebas que le hicieron previamente.  En cada visita le medirn el tamao del tero. Esto se realiza para asegurarse de que el beb est creciendo correctamente de acuerdo al estado del embarazo.  Tambin en cada visita prenatal controlarn su presin arterial. Esto se realiza  para asegurarse de que no tenga toxemia.  Se controlar su orina para asegurarse de que no tenga infecciones, diabetes o protena en la orina.  Se controlar su peso regularmente para asegurarse que el aumento ocurre al ritmo indicado. Esto se hace para asegurarse que usted y el beb tienen una evolucin normal.  En algunas ocasiones se realiza una prueba de ultrasonido para confirmar el correcto desarrollo y evolucin del beb. Esta prueba se realiza con ondas sonoras inofensivas para el beb, de modo que el profesional pueda calcular ms precisamente la fecha del parto. Algunas veces se realizan pruebas especializadas del lquido amnitico que rodea al beb. Esta prueba se denomina amniocentesis. El lquido amnitico se obtiene introduciendo una aguja en el vientre (abdomen). Se realiza para controlar los cromosomas en aquellos casos en los que existe alguna preocupacin acerca de algn problema gentico que pueda sufrir el beb. En ocasiones se lleva a cabo cerca del final del embarazo, si es necesario inducir al parto. En este caso se realiza para asegurarse que los pulmones del beb estn lo suficientemente maduros como para que pueda vivir fuera del tero. CAMBIOS QUE OCURREN EN EL SEGUNDO TRIMESTRE DEL EMBARAZO Su organismo atravesar numerosos cambios durante el embarazo. Estos pueden variar de una persona a otra. Converse con el profesional que la asiste acerca los cambios que usted note y que la preocupen.  Durante el segundo trimestre probablemente sienta un aumento del apetito. Es normal tener "antojos" de ciertas comidas. Esto vara de una persona a otra y de un embarazo a otro.  El abdomen inferior comenzar a abultarse.  Podr tener la necesidad de orinar con ms frecuencia debido a que   el tero y el beb presionan sobre la vejiga. Tambin es frecuente contraer ms infecciones urinarias durante el embarazo (dolor al orinar). Puede evitarlas bebiendo gran cantidad de lquidos y vaciando  la vejiga antes y despus de mantener relaciones sexuales.  Podrn aparecer las primeras estras en las caderas, abdomen y mamas. Estos son cambios normales del cuerpo durante el embarazo. No existen medicamentos ni ejercicios que puedan prevenir estos cambios.  Es posible que comience a desarrollar venas inflamadas y abultadas (varices) en las piernas. El uso de medias de descanso, elevar sus pies durante 15 minutos, 3 a 4 veces al da y limitar la sal en su dieta ayuda a aliviar el problema.  Podr sentir acidez gstrica a medida que el tero crece y presiona contra el estmago. Puede tomar anticidos, con la autorizacin de su mdico, para aliviar este problema. Tambin es til ingerir pequeas comidas 4 a 5 veces al da.  La constipacin puede tratarse con un laxante o agregando fibra a su dieta. Beber grandes cantidades de lquidos, comer vegetales, frutas y granos integrales es de gran ayuda.  Tambin es beneficioso practicar actividad fsica. Si ha sido una persona activa hasta el embarazo, podr continuar con la mayora de las actividades durante el mismo. Si ha sido menos activa, puede ser beneficioso que comience con un programa de ejercicios, como realizar caminatas.  Puede desarrollar hemorroides (vrices en el recto) hacia el final del segundo trimestre. Tomar baos de asiento tibios y utilizar cremas recomendadas por el profesional que lo asiste sern de ayuda para los problemas de hemorroides.  Tambin podr sentir dolor de espalda durante este momento de su embarazo. Evite levantar objetos pesados, utilice zapatos de taco bajo y mantenga una buena postura para ayudar a reducir los problemas de espalda.  Algunas mujeres embarazadas desarrollan hormigueo y adormecimiento de la mano y los dedos debido a la hinchazn y compresin de los ligamentos de la mueca (sndrome del tnel carpiano). Esto desaparece una vez que el beb nace.  Como sus pechos se agrandan, necesitar un sujetador  ms grande. Use un sostn de soporte, cmodo y de algodn. No utilice un sostn para amamantar hasta el ltimo mes de embarazo si va a amamantar al beb.  Podr observar una lnea oscura desde el ombligo hacia la zona pbica denominada linea nigra.  Podr observar que sus mejillas se ponen coloradas debido al aumento de flujo sanguneo en la cara.  Podr desarrollar "araitas" en la cara, cuello y pecho. Esto desaparece una vez que el beb nace. INSTRUCCIONES PARA EL CUIDADO DOMICILIARIO  Es extremadamente importante que evite el cigarrillo, hierbas medicinales, alcohol y las drogas no prescriptas durante el embarazo. Estas sustancias qumicas afectan la formacin y el desarrollo del beb. Evite estas sustancias durante todo el embarazo para asegurar el nacimiento de un beb sano.  La mayor parte de los cuidados que se aconsejan son los mismos que los indicados para el primer trimestre del embarazo. Cumpla con las citas tal como se le indic. Siga las instrucciones del profesional que lo asiste con respecto al uso de los medicamentos, el ejercicio y la dieta.  Durante el embarazo debe obtener nutrientes para usted y para su beb. Consuma alimentos balanceados a intervalos regulares. Elija alimentos como carne, pescado, leche y otros productos lcteos descremados, vegetales, frutas, panes integrales y cereales. El profesional le informar cul es el aumento de peso ideal.  Las relaciones sexuales fsicas pueden continuarse hasta cerca del fin del embarazo si no existen otros problemas. Estos   problemas pueden ser la prdida temprana (prematura) de lquido amnitico de las membranas, sangrado vaginal, dolor abdominal u otros problemas mdicos o del embarazo.  Realice actividad fsica todos los das, si no tiene restricciones. Consulte con el profesional que la asiste si no sabe con certeza si determinados ejercicios son seguros. El mayor aumento de peso tiene lugar durante los ltimos 2 trimestres del  embarazo. El ejercicio la ayudar a:  Controlar su peso.  Ponerla en forma para el parto.  Ayudarla a perder peso luego de haber dado a luz.  Use un buen sostn o como los que se usan para hacer deportes para aliviar la sensibilidad de las mamas. Tambin puede serle til si lo usa mientras duerme. Si pierde calostro, podr utilizar apsitos en el sostn.  No utilice la baera con agua caliente, baos turcos y saunas durante el embarazo.  Utilice el cinturn de seguridad sin excepcin cuando conduzca. Este la proteger a usted y al beb en caso de accidente.  Evite comer carne cruda, queso crudo, y el contacto con los utensilios y desperdicios de los gatos. Estos elementos contienen grmenes que pueden causar defectos de nacimiento en el beb.  El segundo trimestre es un buen momento para visitar a su dentista y evaluar su salud dental si an no lo ha hecho. Es importante mantener los dientes limpios. Utilice un cepillo de dientes blando. Cepllese ms suavemente durante el embarazo.  Es ms fcil perder algo de orina durante el embarazo. Apretar y fortalecer los msculos de la pelvis la ayudar con este problema. Practique detener la miccin cuando est en el bao. Estos son los mismos msculos que necesita fortalecer. Son tambin los mismos msculos que utiliza cuando trata de evitar los gases. Puede practicar apretando estos msculos 10 veces, y repetir esto 3 veces por da aproximadamente. Una vez que conozca qu msculos debe apretar, no realice estos ejercicios durante la miccin. Puede favorecerle una infeccin si la orina vuelve hacia atrs.  Pida ayuda si tiene necesidades econmicas, de asesoramiento o nutricionales durante el embarazo. El profesional podr ayudarla con respecto a estas necesidades, o derivarla a otros especialistas.  La piel puede ponerse grasa. Si esto sucede, lvese la cara con un jabn suave, utilice un humectante no graso y maquillaje con base de aceite o  crema. CONSUMO DE MEDICAMENTOS Y DROGAS DURANTE EL EMBARAZO  Contine tomando las vitaminas apropiadas para esta etapa tal como se le indic. Las vitaminas deben contener un miligramo de cido flico y deben suplementarse con hierro. Guarde todas las vitaminas fuera del alcance de los nios. La ingestin de slo un par de vitaminas o tabletas que contengan hierro puede ocasionar la muerte en un beb o en un nio pequeo.  Evite el uso de medicamentos, inclusive los de venta libre y hierbas que no hayan sido prescriptos o indicados por el profesional que la asiste. Algunos medicamentos pueden causar problemas fsicos al beb. Utilice los medicamentos de venta libre o de prescripcin para el dolor, el malestar o la fiebre, segn se lo indique el profesional que lo asiste. No utilice aspirina.  El consumo de alcohol est relacionado con ciertos defectos de nacimiento. Esto incluye el sndrome de alcoholismo fetal. Debe evitar el consumo de alcohol en cualquiera de sus formas. El cigarrillo causa nacimientos prematuros y bebs de bajo peso. El uso de drogas recreativas est absolutamente prohibido. Son muy nocivas para el beb. Un beb que nace de una madre adicta, ser adicto al nacer. Ese beb tendr los mismos   sntomas de abstinencia que un adulto.  Infrmele al profesional si consume alguna droga.  No consuma drogas ilegales. Pueden causarle mucho dao al beb. SOLICITE ATENCIN MDICA SI: Tiene preguntas o preocupaciones durante su embarazo. Es mejor que llame para consultar las dudas que esperar hasta su prxima visita prenatal. De esta forma se sentir ms tranquila.  SOLICITE ATENCIN MDICA DE INMEDIATO SI:  La temperatura oral se eleva sin motivo por encima de 102 F (38.9 C) o segn le indique el profesional que lo asiste.  Tiene una prdida de lquido por la vagina (canal de parto). Si sospecha una ruptura de las membranas, tmese la temperatura y llame al profesional para informarlo sobre  esto.  Observa unas pequeas manchas, una hemorragia vaginal o elimina cogulos. Notifique al profesional acerca de la cantidad y de cuntos apsitos est utilizando. Unas pequeas manchas de sangre son algo comn durante el embarazo, especialmente despus de mantener relaciones sexuales.  Presenta un olor desagradable en la secrecin vaginal y observa un cambio en el color, de transparente a blanco.  Contina con las nuseas y no obtiene alivio de los remedios indicados. Vomita sangre o algo similar a la borra del caf.  [place] o sube ms de 900 g. en una semana, o segn lo indicado por el profesional que la asiste.  Observa que se le hinchan el rostro, las manos, los pies o las piernas.  Ha estado expuesta a la rubola y no ha sufrido la enfermedad.  Ha estado expuesta a la quinta enfermedad o a la varicela.  Presenta dolor abdominal. Las molestias en el ligamento redondo son una causa no cancerosa (benigna) frecuente de dolor abdominal durante el embarazo. El profesional que la asiste deber evaluarla.  Presenta dolor de cabeza intenso que no se alivia.  Presenta fiebre, diarrea, dolor al orinar o le falta la respiracin.  Presenta dificultad para ver, visin borrosa, o visin doble.  Sufre una cada, un accidente de trnsito o cualquier tipo de trauma.  Vive en un hogar en el que existe violencia fsica o mental. Document Released: 05/14/2005 Document Revised: 10/27/2011 [employer] Patient Information 2013 [employer].  Lactancia materna  (Breastfeeding) Decidir amamantar es una de las mejores elecciones que puede hacer por usted y su beb. La informacin que se brinda a continuacin le dar una breve visin de los beneficios de la lactancia materna as como de las dudas ms frecuentes alrededor de ella.  LOS BENEFICIOS DE AMAMANTAR  Para el beb   La primera leche (calostro ) ayuda al mejor funcionamiento del sistema digestivo del beb.   La leche tiene anticuerpos que  provienen de la madre y que ayudan a prevenir las infecciones en el beb.   El beb tiene una menor incidencia de asma, alergias y del sndrome de muerte sbita del lactante (SMSL).   Los nutrientes en la leche materna son mejores para el beb que los preparados para lactantes y la leche materna ayuda a un mejor desarrollo del cerebro del beb.   Los bebs amamantados tienen menos gases, clicos y estreimiento.  Para la mam   La lactancia materna favorece el desarrollo de un vnculo muy especial entre la madre y el beb.   Es ms conveniente, siempre disponible y a la temperatura adecuada y econmico.   Consume caloras en la madre y la ayuda a perder el peso ganado durante el embarazo.   Favorece la contraccin del tero a su tamao normal, de manera ms rpida y disminuye las hemorragias luego del   parto.   Las madres que amamantan tienen menor riesgo de desarrollar cncer de mama.  FRECUENCIA DEL AMAMANTAMIENTO   Un beb sano, nacido a trmino, puede amamantarse con tanta frecuencia como cada hora, o espaciar las comidas cada tres horas.   Observe al beb cuando manifieste signos de hambre. Amamante a su beb si muestra signos de hambre. Esta frecuencia variar de un beb a otro.   Amamntelo tan seguido como el beb lo solicite, o cuando usted sienta la necesidad de aliviar sus mamas.   Despierte al beb si han pasado 3  4 horas desde la ltima comida.   El amamantamiento frecuente la ayudar a producir ms leche y a prevenir problemas de dolor en los pezones e hinchazn de las mamas.  POSICIN DEL BEBE PARA EL AMAMANTAMIENTO   Ya sea que se encuentre acostada o sentada, asegrese que el abdomen del beb enfrente el suyo.   Sostenga la mama con el pulgar por arriba y los otros 4 dedos por debajo. Asegrese que sus dedos se encuentren lejos del pezn y de la boca del beb.   Empuje suavemente los labios del beb con el pezn o con el dedo.   Cuando la boca  del beb se abra lo suficiente, introduzca el pezn y la areola tanto como le sea posible dentro de la boca.   Coloque al beb cerca suyo de modo que su nariz y mejillas toquen las mamas al mamar.  ALIMENTACIN Y SUCCIN   La duracin de cada comida vara de un beb a otro y de una comida a otra.   El beb debe succionar entre 2 y 3 minutos para que le llegue leche. Esto se denomina "bajada". Por este motivo, permita que el nio se alimente en cada mama todo lo que desee. Terminar de mamar cuando haya recibido la cantidad adecuada de nutrientes.   Para detener la succin coloque su dedo en la comisura de la boca del nio y deslcelo entre sus encas antes de quitarle la mama de la boca. Esto la ayudar a evitar el dolor en los pezones.  COMO SABER SI EL BEB OBTIENE LA SUFICIENTE LECHE MATERNA  Preguntarse si el beb obtiene la cantidad suficiente de leche es una preocupacin frecuente entre las madres. Puede asegurarse que el beb tiene la leche suficiente si:   El beb succiona activamente y usted escucha que traga .   El beb parece estar relajado y satisfecho despus de mamar.   El nio se alimenta al menos 8 a 12 veces en 24 horas. Alimntelo hasta que se desprenda por sus propios medios o se quede dormido en la primera mama (al menos durante 10 a 20 minutos), luego ofrzcale el otro lado.   El beb moja 5 a 6 paales desechables (6 a 8 paales de tela) en 24 horas cuando tiene 5  6 das de vida.   Tiene al menos 3 a 4 deposiciones todos los das en los primeros meses. La materia fecal debe ser blanda y amarillenta.   El beb debe aumentar 4 a 6 libras (120 a 170 gr.) por semana despus de los 4 das de vida.   Siente que las mamas se ablandan despus de amamantar  REDUCIR LA CONGESTIN DE LAS MAMAS   Durante la primera semana despus del parto, usted puede experimentar hinchazn en las mamas. Cuando las mamas estn congestionadas, se sienten calientes, llenas y  molestas al tacto. Puede reducir la congestin si:   Lo amamanta frecuentemente, cada 2-3   horas. Las mams que amamantan pronto y con frecuencia tienen menos problemas de congestin.   Coloque compresas de hielo en sus mamas durante 10-20 minutos entre cada amamantamiento. Esto ayuda a reducir la hinchazn. Envuelva las bolsas de hielo en una toalla liviana para proteger su piel. Las bolsas de vegetales congelados funcionan bien para este propsito.   Tome una ducha tibia o aplique compresas hmedas calientes en las mamas durante 5 a 10 minutos antes de cada vez que amamanta. Esto aumenta la circulacin y ayuda a que la leche fluya.   Masajee suavemente la mama antes y durante la alimentacin. Con las puntas de los dedos, masajee desde la pared torcica hacia abajo hasta llegar al pezn, con movimientos circulares.   Asegrese que el nio vaca al menos una mama antes de cambiar de lado.   Use un sacaleche para vaciar la mama si el beb se duerme o no se alimenta bien. Tambin podr quitarse la leche con esa bomba si tiene que volver al trabajo o siente que las mamas estn congestionadas.   Evite los biberones, chupetes o complementar la alimentacin con agua o jugos en lugar de la leche materna. La leche materna es todo el alimento que el beb necesita. No es necesario que el nio ingiera agua o preparados de bibern. De hecho, es lo mejor para ayudar a que las mamas produzcan ms leche. no darle suplementos al nio durante las primeras semanas.   Verifique que el beb se encuentra en la posicin correcta mientras lo alimenta.   Use un sostn que soporte bien sus mamas y evite los que tienen aro.   Consuma una dieta balanceada y beba lquidos en cantidad.   Descanse con frecuencia, reljese y tome sus vitaminas prenatales para evitar la fatiga, el estrs y la anemia.  Si sigue estas indicaciones, la congestin debe mejorar en 24 a 48 horas. Si an tiene dificultades, consulte a su  asesor en lactancia.  CUDESE USTED MISMA  Cuide sus mamas.   Bese o dchese diariamente.   Evite usar jabn en los pezones.   Comience a amamantar del lado izquierdo en una comida y del lado derecho en la siguiente.   Notar que aumenta el flujo de leche a los 2 a 5 das despus del parto. Puede sentir algunas molestias por la congestin, lo que hace que sus mamas estn duras y sensibles. La congestin disminuye en 24 a 48 horas. Mientras tanto, aplique toallas hmedas calientes durante 5 a 10 minutos antes de amamantar. Un masaje suave y la extraccin de un poco de leche antes de amamantar ablandarn las mamas y har ms fcil que el beb se agarre.   Use un buen sostn y seque al aire los pezones durante 3 a 4 minutos luego de cada alimentacin.   Solo utilice apsitos de algodn.   Utilice lanolina pura sobre los pezones luego de amamantar. No necesita lavarlos luego de alimentar al nio. Otra opcin es exprimir algunas gotas de leche y masajear suavemente los pezones.  Cumpla con estos cuidados   Consuma alimentos bien balanceados y refrigerios nutritivos.   Beba leche, jugos de fruta y agua para satisfacer la sed (alrededor de 8 vasos por da).   Descanse lo suficiente.  Evite los alimentos que usted note que pueden afectar al beb.  SOLICITE ATENCIN MDICA SI:   Tiene dificultad con la lactancia materna y necesita ayuda.   Tiene una zona de color rojo, dura y dolorosa en la mama que se acompaa   de fiebre.   El beb est muy somnoliento como para alimentarse bien o tiene problemas para dormir.   Su beb moja menos de 6 paales al da, a los 5 das de vida.   La piel del beb o la parte blanca de sus ojos est ms amarilla de lo que estaba en el hospital.   Se siente deprimida.  Document Released: 08/04/2005 Document Revised: 02/03/2012 [employer] Patient Information 2013 [employer].  

## 2012-08-18 NOTE — L&D Delivery Note (Signed)
Delivery Note At 8:46 PM a viable female was delivered via Vaginal, Spontaneous Delivery (Presentation: Left Occiput Anterior).  APGAR: 9, 9; weight 8 lb 2.9 oz (3710 g).   Placenta status: Intact, Spontaneous.  Cord: 3 vessels with the following complications: None.  Cord pH: n/a  Anesthesia: Epidural  Episiotomy: None Lacerations: Perineal;2nd degree Suture Repair: 3.0 vicryl rapide Est. Blood Loss (mL): 300  Mom to postpartum.  Baby to nursery-stable.  Barbara Coffey 02/13/2013, 10:43 PM

## 2012-08-25 ENCOUNTER — Ambulatory Visit (INDEPENDENT_AMBULATORY_CARE_PROVIDER_SITE_OTHER): Payer: Self-pay | Admitting: Obstetrics & Gynecology

## 2012-08-25 VITALS — BP 118/68 | Wt 155.0 lb

## 2012-08-25 DIAGNOSIS — O09519 Supervision of elderly primigravida, unspecified trimester: Secondary | ICD-10-CM

## 2012-08-25 DIAGNOSIS — Z3480 Encounter for supervision of other normal pregnancy, unspecified trimester: Secondary | ICD-10-CM

## 2012-08-25 NOTE — Patient Instructions (Signed)
Return to clinic for any obstetric concerns or go to [facility] for evaluation  

## 2012-08-25 NOTE — Progress Notes (Signed)
Patient is Spanish-speaking only, Spanish interpreter present for this encounter. Anatomy scan ordered.  No other complaints or concerns.  Fetal movement and labor precautions reviewed.

## 2012-09-21 ENCOUNTER — Other Ambulatory Visit: Payer: Self-pay | Admitting: Obstetrics & Gynecology

## 2012-09-21 ENCOUNTER — Encounter: Payer: Self-pay | Admitting: Obstetrics & Gynecology

## 2012-09-21 ENCOUNTER — Ambulatory Visit (HOSPITAL_COMMUNITY)
Admission: RE | Admit: 2012-09-21 | Discharge: 2012-09-21 | Disposition: A | Discharge status: Home / Self Care | Payer: Self-pay | Source: Ambulatory Visit | Attending: Obstetrics & Gynecology | Admitting: Obstetrics & Gynecology

## 2012-09-21 DIAGNOSIS — Z3480 Encounter for supervision of other normal pregnancy, unspecified trimester: Secondary | ICD-10-CM

## 2012-09-21 DIAGNOSIS — Z789 Other specified health status: Secondary | ICD-10-CM | POA: Insufficient documentation

## 2012-09-21 DIAGNOSIS — O09529 Supervision of elderly multigravida, unspecified trimester: Secondary | ICD-10-CM | POA: Insufficient documentation

## 2012-09-21 DIAGNOSIS — O09519 Supervision of elderly primigravida, unspecified trimester: Secondary | ICD-10-CM

## 2012-09-21 DIAGNOSIS — Z3689 Encounter for other specified antenatal screening: Secondary | ICD-10-CM | POA: Insufficient documentation

## 2012-09-22 ENCOUNTER — Ambulatory Visit (INDEPENDENT_AMBULATORY_CARE_PROVIDER_SITE_OTHER): Payer: Self-pay | Admitting: Obstetrics & Gynecology

## 2012-09-22 VITALS — BP 114/63 | Wt 159.0 lb

## 2012-09-22 DIAGNOSIS — Z609 Problem related to social environment, unspecified: Secondary | ICD-10-CM

## 2012-09-22 DIAGNOSIS — Z3480 Encounter for supervision of other normal pregnancy, unspecified trimester: Secondary | ICD-10-CM

## 2012-09-22 DIAGNOSIS — O09519 Supervision of elderly primigravida, unspecified trimester: Secondary | ICD-10-CM

## 2012-09-22 DIAGNOSIS — R300 Dysuria: Secondary | ICD-10-CM

## 2012-09-22 DIAGNOSIS — O26899 Other specified pregnancy related conditions, unspecified trimester: Secondary | ICD-10-CM

## 2012-09-22 DIAGNOSIS — Z789 Other specified health status: Secondary | ICD-10-CM

## 2012-09-22 NOTE — Patient Instructions (Signed)
Return to clinic for any obstetric concerns or go to [facility] for evaluation  

## 2012-09-22 NOTE — Progress Notes (Signed)
Normal anatomy scan.  Reports dysuria, urine dipstick negative, culture sent. Will follow up results and manage accordingly. No other complaints or concerns.  Obstetric precautions reviewed. Patient is Spanish-speaking only, Spanish interpreter present for this encounter.

## 2012-09-23 LAB — CULTURE, OB URINE: Colony Count: NO GROWTH

## 2012-10-20 ENCOUNTER — Encounter: Payer: Self-pay | Admitting: Obstetrics and Gynecology

## 2012-10-20 ENCOUNTER — Ambulatory Visit (INDEPENDENT_AMBULATORY_CARE_PROVIDER_SITE_OTHER): Payer: Self-pay | Admitting: Obstetrics and Gynecology

## 2012-10-20 VITALS — BP 110/70 | Wt 164.0 lb

## 2012-10-20 DIAGNOSIS — O09519 Supervision of elderly primigravida, unspecified trimester: Secondary | ICD-10-CM

## 2012-10-20 NOTE — Progress Notes (Signed)
Had one episode of bleeding on 10/01/12, patient did not go to office or hospital for evaluation. No other complaints today.

## 2012-10-20 NOTE — Progress Notes (Signed)
Patient doing well without complaints. No further episodes of vaginal bleeding. Some round ligament pain. PTL precautions discussed

## 2012-10-27 ENCOUNTER — Telehealth: Payer: Self-pay

## 2012-10-27 NOTE — Telephone Encounter (Signed)
Patient Barbara Coffey is part of the [facility] [facility] program  here at our clinic. In January Dr. Macon Large ordered for her to have an ultrasound done. Due to the fact that patient has to pay all her ultrasound out of pocket up front she asked the physician what were her options in terms of getting the cheapest u/s. Dr. Macon Large told her she can order the complete versus the detailed so it wouldn't be so expensive. Patient agreed. She then proceeded to ask me (I was being used as the interpreter) if I could tell her what the price difference would be, I told her I would have to call Radiology and find out which I did and spoke to [place] (I believe that was here name Supervisor at the time of call) she gave me the price difference one being in the range of $700 and the other being in the $1000 dollar range. Patient opted for the cheapest. We scheduled it and patient had it done. She came in for her OB follow up and brought me a bill from her U/S and the charge was 1,400. She was confused because that was not the amount that she agreed in paying. She said that it looked like they charged her for the more expensive U/S which she cannot pay. I called Chief Executive Officer of Radiology and left her a message about it. But did not hear from her so I called her once again a week later because the patient called to inquire about it. I finally got ahold of [place] and we spoke about the situation she was going to look into it and call me back about it. I spoke with her last on 10/26/12. [place]  for her answer patient is not able to pay this amount.

## 2012-11-10 ENCOUNTER — Ambulatory Visit (INDEPENDENT_AMBULATORY_CARE_PROVIDER_SITE_OTHER): Payer: Self-pay | Admitting: Obstetrics and Gynecology

## 2012-11-10 VITALS — BP 102/68 | Wt 164.0 lb

## 2012-11-10 DIAGNOSIS — Z603 Acculturation difficulty: Secondary | ICD-10-CM

## 2012-11-10 DIAGNOSIS — O09512 Supervision of elderly primigravida, second trimester: Secondary | ICD-10-CM

## 2012-11-10 DIAGNOSIS — O09519 Supervision of elderly primigravida, unspecified trimester: Secondary | ICD-10-CM

## 2012-11-10 DIAGNOSIS — Z789 Other specified health status: Secondary | ICD-10-CM

## 2012-11-10 DIAGNOSIS — Z609 Problem related to social environment, unspecified: Secondary | ICD-10-CM

## 2012-11-10 DIAGNOSIS — Z3480 Encounter for supervision of other normal pregnancy, unspecified trimester: Secondary | ICD-10-CM

## 2012-11-10 DIAGNOSIS — Z3482 Encounter for supervision of other normal pregnancy, second trimester: Secondary | ICD-10-CM

## 2012-11-10 NOTE — Progress Notes (Signed)
Patient doing well without complaints. FM/PTL precautions reviewed. 1hr GCT and labs next visit

## 2012-11-10 NOTE — Progress Notes (Signed)
P-[id number] 

## 2012-11-28 ENCOUNTER — Ambulatory Visit: Payer: Self-pay | Admitting: Physician Assistant

## 2012-11-28 VITALS — BP 110/74 | HR 88 | Temp 98.2°F | Resp 16 | Ht 61.25 in | Wt 169.0 lb

## 2012-11-28 DIAGNOSIS — M79609 Pain in unspecified limb: Secondary | ICD-10-CM

## 2012-11-28 DIAGNOSIS — T23201A Burn of second degree of right hand, unspecified site, initial encounter: Secondary | ICD-10-CM
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
## 2012-12-01 ENCOUNTER — Ambulatory Visit (INDEPENDENT_AMBULATORY_CARE_PROVIDER_SITE_OTHER): Payer: Self-pay | Admitting: Obstetrics & Gynecology

## 2012-12-01 VITALS — BP 109/72 | Wt 168.0 lb

## 2012-12-01 DIAGNOSIS — Z609 Problem related to social environment, unspecified: Secondary | ICD-10-CM

## 2012-12-01 DIAGNOSIS — O09513 Supervision of elderly primigravida, third trimester: Secondary | ICD-10-CM

## 2012-12-01 DIAGNOSIS — O09519 Supervision of elderly primigravida, unspecified trimester: Secondary | ICD-10-CM

## 2012-12-01 DIAGNOSIS — Z3483 Encounter for supervision of other normal pregnancy, third trimester: Secondary | ICD-10-CM

## 2012-12-01 DIAGNOSIS — Z23 Encounter for immunization: Secondary | ICD-10-CM

## 2012-12-01 DIAGNOSIS — Z789 Other specified health status: Secondary | ICD-10-CM

## 2012-12-01 DIAGNOSIS — Z3480 Encounter for supervision of other normal pregnancy, unspecified trimester: Secondary | ICD-10-CM

## 2012-12-01 LAB — CBC
HCT: 35.7 % — ABNORMAL LOW (ref 36.0–46.0)
Hemoglobin: 12.1 g/dL (ref 12.0–15.0)
MCH: 30.7 pg (ref 26.0–34.0)
MCHC: 33.9 g/dL (ref 30.0–36.0)

## 2012-12-01 MED ORDER — TETANUS-DIPHTH-ACELL PERTUSSIS 5-2.5-18.5 LF-MCG/0.5 IM SUSP: 0.5000 mL | Freq: Once | INTRAMUSCULAR | Status: DC

## 2012-12-01 NOTE — Progress Notes (Signed)
Doing 1hr GTT today.  

## 2012-12-01 NOTE — Patient Instructions (Addendum)
Return to clinic for any obstetric concerns or go to [facility] for evaluation Vacuna difteria/ttanos (Td) o [place] difteria, ttanos, tos convulsa (Tdap), Lo que debe saber (Tetanus, Diphtheria [Td] or Tetanus, Diphtheria, Pertussis [Tdap] Vaccine, What You Need to Know) PORQU VACUNARSE? El ttanos , la difteria y la tos ferina pueden ser enfermedades graves.  El TTANOS  (trismo) provoca la contraccin dolorosa y rigidez de los msculos, por lo general, en todo el cuerpo.   Puede causar la contraccin de los msculos de la cabeza y el cuello de modo que el enfermo no puede abrir la boca ni tragar., y en algunos casos, tampoco puede respirar.. El ttanos causa la muerte de 1 de cada 5 personas que se infectan. LA DIFTERIA produce la formacin de una membrana gruesa que cubre el fondo de la garganta.  Puede causar problemas respiratorios, parlisis, insuficiencia cardaca, e incluso la muerte. El PERTUSIS (tos [place]) causa ataques de tos intensa que pueden dificultar la respiracin, provocar vmitos e interrumpir el sueo.   Puede causar prdida de peso, incontinencia, fractura de [place], y desmayos por la intensa tos. Hasta de 2 de cada 100 adolescentes y 5 de cada 100 adultos que enferman de tos [place] deben ser hospitalizados o tienen complicaciones como la neumona y la [place]. Estas 3 enfermedades son provocadas por bacterias. La difteria y la tos Benetta Spar se [place] de persona a Social worker. El ttanos ingresa al organismo a travs de cortes, rasguos o heridas. En los [place] ocurran alrededor de 200 000 casos por ao de difteria y tos [place], antes de que existieran las [place], y tambin ocurran cientos de casos de ttanos. Desde la aparicin de las vacunas, el ttanos y la difteria han disminuido en alrededor del 99% y los casos de tos ferina disminuyeron aproximadamente el 92%.  Los nios menores de 6 aos deben recibir la vacuna DTaP para estar protegidos contra estas tres  enfermedades. Pero los [employer], los adolescentes y los adultos tambin necesitan proteccin. VACUNAS PARA ADOLESCENTES Y ADULTOS Vacunas Tdap y Td  Hay dos vacunas disponibles para proteger de estas enfermedades a nios a Glass blower/designer de los 7aos:   La vacuna Td fue utilizada durante muchos aos. [employer]ge contra el ttanos y la difteria.  La vacuna Tdap fue autorizada en 2005. Es la primera vacuna para adolescentes y adultos que protege contra la tos ferina y el ttanos y la difteria. Una dosis de refuerzo de la Td se recomienda cada 10 aos. La Tdap se aplica slo una vez.  QU VACUNA DEBO APLICARME Y CUANDO? Las edades de 7 a 18 aos  [employer] 11 y los 12 aos se recomienda una dosis de Tdap. Esta dosis puede aplicarse desde los 7 aos en los nios que no han recibido una o ms dosis de DTaP anteriormente.  Los nios y adolescentes que no recibieron todas las dosis programadas de DTaP o DTP a los 7 aos deben completar la serie usando una combinacin de Td y Tdap. Adultos de 19 aos o ms  [employer] adultos deben recibir una dosis de refuerzo de Td cada 10 aos. Los adultos de menos de 65 aos que nunca hayan recibido la Tdap deben reemplazarla por la siguiente dosis de refuerzo. Los adultos a partir de los 65 aos puedenrecibir una dosis de Tdap.  Los adultos (incluyendo las mujeres que podran quedar embarazadas y los adultos mayores de 65 aos) que tienen contacto cercano con un beb menor de 12 meses deben aplicarse una dosis de  Tdap para proteger al beb de la tos ferina.  Los trabajadores de la salud que tengan contacto directo con pacientes en hospitales o clnicas deben recibir una dosis de Tdap. Proteccin despus de [place] herida  Es posible que una persona que tenga un corte o quemadura grave necesite una dosis de Td o Tdap para prevenir la infeccin por ttanos. Puede usarse la Tdap en personas que nunca recibieron una dosis. Pero debe usarse la Td, si la Tdap no se encuentra  disponible, o para:  Cualquier persona que haya recibido una dosis de Tdap.  Los nios [employer] 7 y los 9 aos que han [employer] series de DTap anteriormente.  Adultos de 65 aos o ms. Mujeres embarazadas.   Las mujeres embarazadas que nunca recibieron una dosis de Ddap deben recibirla despus de la 20a semana de gestacin y preferiblemente durante Contractor. trimestre. Si no se aplican la Tdap durante el embarazo, deben recibirla lo antes posible despus del parto. Las mujeres embarazadas que han recibido la Tdap y tienen que aplicarse la vacuna contra el ttanos o la difteria durante el [place], deben recibir la Td. Las vacunas Tdap y Td pueden ser administradas al mismo tiempo que otras vacunas. ALGUNAS PERSONAS NO DEBEN RECIBIR LA VACUNA O DEBEN [employer]  Las personas que hayan tenido una reaccin alrgica que haya puesto en peligro su vida despus de una dosis de vacuna contra el ttanos, la difteria o la tos ferina no deben recibir Td ni Tdap..  Las personas que tengan alergias graves a algn componente de una vacuna no deben recibir esa vacuna. Informe a su mdico si la persona que recibe la vacuna sufre alergias graves.  Cualquier persona que [employer] en coma o que haya tenido convulsiones dentro de los 7 [address]  [address] posteriores despus de una dosis de DTP o DTaP no debe recibir la Tdap, salvo que se encuentre una causa que no fuera la vacuna. Estas personas pueden recibir Td.  Consulte a su mdico si la persona que recibe [place] de las vacunas:  Tiene epilepsia o algn otro problema del sistema nervioso.  Tuvo inflamacin o dolor intenso despus de una dosis de DTP, DTaP, DT, Td, o Tdap.  Ha tenido el sndrome de Scientific laboratory technician (GBS por sus siglas en ingls). Las personas que sufran una enfermedad moderada o grave el da en que se programa la vacuna, deben esperar a recuperarse para recibir las vacunas Tdap o Td. Por lo general, una persona con una enfermedad leve o fiebre baja  puede recibir la vacuna. CULES SON LOS RIESGOS DE LAS VACUNAS TDAP Y TD? Con Cathleen Corti, al igual que con cualquier [employer], siempre existe un pequeo riesgo de una reaccin alrgica que ponga en peligro la vida o cause otro problema grave. Todo procedimiento mdico, inclusive la vacunacin pueden causar breves episodios de lipotimia o sntomas relacionados (como movimientos espasmdicos). Para evitar los [employer] y las lesiones causadas por las cadas, permanezca sentado o recustese durante los 15 minutos posteriores a la vacunacin. Informe a su mdico si el paciente se siente dbil o mareado, tiene cambios en la visin o siente zumbidos en los odos.  Es mucho ms probable que tener ttanos, difteria, o tos ferina cause problemas ms graves que los provocados por recibir cualquiera de las vacunas Td o Tdap. A continuacin se enumeran los problemas informados despus de las vacunas Td y Tdap. Problemas Leves (perceptibles, pero que no interfirieron con las actividades): Tdap  Dolor (alrededor de 3 de cada  4 adolescentes y 2 de cada 3 adultos).  Enrojecimiento o inflamacin en el sitio de la inyeccin (alrededor de 1 de cada 5).  Fiebre leve de al menos 100.4 F (38 C) (hasta alrededor de 1 cada 25 adolescentes y 1 de cada 100 adultos).  Dolor de cabeza (alrededor de 4 de cada 10 adolescentes y 3 de cada 10 adultos).  Cansancio (alrededor de 1 de cada 3 adolescentes y 1 de cada 4 adultos).  Nuseas, vmitos, diarrea, o dolor de estmago (hasta 1 de cada 4 adolescentes y 1 de cada 10 adultos).  Escalofros, dolores corporales, dolor articular, erupciones, o inflamacin de las glndulas (poco frecuente). Td  Dolor (hasta alrededor de 8 de cada 10).  Enrojecimiento o inflamacin de la inyeccin (alrededor de 1 de cada 3).  Fiebre leve (hasta alrededor de 1 de cada 5).  Dolor de cabeza o cansancio (poco frecuente). Problemas Moderados (interfieren con las [place], [place] no  requieren atencin mdica): Tdap  Dolor en el sitio de la inyeccin (alrededor de 1 de cada 20 adolescentes y 1 de cada 100 adultos).  Enrojecimiento o inflamacin de la inyeccin (alrededor de 1 de cada 16 adolescentes y 1 de cada 25 adultos).  Fiebre de ms de 102 F (38.9 C) (alrededor de 1 de cada 100 adolescentes y 1 de cada 250 adultos).  Dolor de cabeza (1 de cada 300).  Nuseas, vmitos, diarrea, o dolor de estmago (hasta 3 de cada 100 adolescentes y 1 de cada 100 adultos). Td  Fiebre de ms de 102 F (38.9 C) (poco comn). Tdap o Td  Inflamacin de gran extensin en el brazo en el que se aplic la vacuna (hasta 3 de cada 100). Problemas [place] (no puede realizar [employer]; requiere Psychologist, prison and probation services) Tdap o Td  Inflamacin, dolor intenso, sangrado y enrojecimiento en el brazo, en el sitio de la inyeccin (poco frecuente). Puede producirse una reaccin alrgica grave despus de cualquier vacuna. Se estima que estas reacciones ocurren en menos de una de cada un milln de dosis. QU PASA SI HAY UNA REACCIN GRAVE? Qu signos debo buscar? Cualquier estado poco habitual, como una reaccin alrgica grave o fiebre alta. Si le produce Runner, broadcasting/film/video grave, se manifestar dentro de algunos minutos a una hora despus de recibir la vacuna. Entre los signos de Automotive engineer grave se encuentran la dificultad para respirar, debilidad, ronquera o sibilancias, latidos cardacos acelerados, urticaria, mareos, palidez, o inflamacin de la garganta. Qu debo hacer?  Comunquese con su mdico o lleve inmediatamente a la persona al mdico.  Dgale a su mdico qu ocurri, la fecha y hora en que sucedi y cundo le aplicaron la vacuna.  Pida a su mdico que informe sobre la reaccin llenando un formulario del Sistema de Informacin de Reacciones Adversos a las Administrator, arts (VAERS, por sus siglas en ingls). O, puede presentar este informe a travs del sitio web de VAERS  [email or web address] o puede llamar al 8657731370. VAERS no brinda asistencia mdica. PROGRAMA NACIONAL DE COMPENSACIN DE DAOS POR VACUNAS El [place] de Compensacin de Daos por Vacunas (VICP) fue creado en 1986.  Aquellas personas que consideren que han sufrido un dao como consecuencia de una vacuna y quieren saber ms acerca del programa y como presentar Roslynn Amble, [place] llamar al [phone] o visitar su sitio web en [email or web address].at  CMO Roxan Diesel MS INFORMACIN?  El profesional podr darle el prospecto de la vacuna o sugerirle otras fuentes de informacin.  Comunquese con el servicio de [employer]  de su localidad o su estado.  Comunquese con los Centros para el control y la prevencin de Child psychotherapist for Disease Control and Prevention , CDC).  Llame al [phone] (1-800-CDC-INFO).  Visite los sitios web de [employer] en PicCapture.uy CDC Td and Tdap Interim VIS-Spanish (09/10/10) Document Released: 11/20/2008 Document Revised: 10/27/2011 [facility] Patient Information 2013 [place], [place].
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
## 2012-12-02 ENCOUNTER — Encounter: Payer: Self-pay | Admitting: Obstetrics & Gynecology

## 2012-12-15 ENCOUNTER — Encounter: Payer: Self-pay | Admitting: Obstetrics and Gynecology

## 2012-12-15 ENCOUNTER — Ambulatory Visit (INDEPENDENT_AMBULATORY_CARE_PROVIDER_SITE_OTHER): Payer: Self-pay | Admitting: Obstetrics and Gynecology

## 2012-12-15 VITALS — BP 112/72 | Wt 172.0 lb

## 2012-12-15 DIAGNOSIS — Z3480 Encounter for supervision of other normal pregnancy, unspecified trimester: Secondary | ICD-10-CM

## 2012-12-15 DIAGNOSIS — Z609 Problem related to social environment, unspecified: Secondary | ICD-10-CM

## 2012-12-15 DIAGNOSIS — O09519 Supervision of elderly primigravida, unspecified trimester: Secondary | ICD-10-CM

## 2012-12-15 DIAGNOSIS — O09512 Supervision of elderly primigravida, second trimester: Secondary | ICD-10-CM

## 2012-12-15 DIAGNOSIS — Z3482 Encounter for supervision of other normal pregnancy, second trimester: Secondary | ICD-10-CM

## 2012-12-15 DIAGNOSIS — Z603 Acculturation difficulty: Secondary | ICD-10-CM

## 2012-12-15 DIAGNOSIS — Z789 Other specified health status: Secondary | ICD-10-CM

## 2012-12-15 NOTE — Progress Notes (Signed)
Patient doing well without complaints. FM/PTL precautions reviewed 
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
## 2012-12-22 ENCOUNTER — Telehealth: Payer: Self-pay

## 2012-12-22 NOTE — Telephone Encounter (Signed)
Spoke with [name] in [facility] Accounts [phone] today regarding this patient's account. Patient has a past due account due to a charge error done. I contacted [name] and [name] from radiology. It's been three months and her account still has not been adjusted. Patient has called me weekly to tell me that [facility] has called her about her bill and she is really concerned being sent to collections. This patient only wants to pay her correct amount but because the delay on our behalf she cannot do this. I had to call billing several times to inform them of the situation and they requested something in writing. I faxed a letter to [facility] billing explaining the situation. Neither [name] or [name] contacted billing to let them know what was going on. My understanding was that [name] had contacted [name] about this and she was suppose to write it off and charge her a fee that our adopt a mom patients get of $250.00. It's been three months and patient is still waiting.

## 2012-12-29 ENCOUNTER — Ambulatory Visit (INDEPENDENT_AMBULATORY_CARE_PROVIDER_SITE_OTHER): Payer: Self-pay | Admitting: Family Medicine

## 2012-12-29 VITALS — BP 96/67 | Wt 177.0 lb

## 2012-12-29 DIAGNOSIS — Z3483 Encounter for supervision of other normal pregnancy, third trimester: Secondary | ICD-10-CM

## 2012-12-29 DIAGNOSIS — Z3480 Encounter for supervision of other normal pregnancy, unspecified trimester: Secondary | ICD-10-CM

## 2012-12-29 NOTE — Assessment & Plan Note (Signed)
Doing well 

## 2012-12-29 NOTE — Patient Instructions (Addendum)
Usted puede tomar Zyrtec, Claritin, Allegra, Benadryl, Nasacort. Estos le ayudan a sus Environmental consultant y son seguros Academic librarian. Rinitis Alrgica (Allergic Rhinitis) La rinitis alrgica aparece cuando las membranas mucosas de la nariz reaccionan a los alrgenos. Los alrgenos son las partculas que estn en el aire y a las que el organismo responde cuando existe una Automotive engineer. Esto hace que usted libere anticuerpos de Programmer, multimedia. A travs de una sucesin de procesos, finalmente se libera histamina (de ah el uso de antihistamnicos) en el torrente sanguneo. Aunque esto implica una proteccin para su organismo, es lo que le produce las [place]., como estornudos frecuentes, congestin, picazn y goteos de Architectural technologist.  CAUSAS Los alergenos del polen pueden provenir del csped, rboles y hierbas. Esto produce la rinitis alrgica estacional, o "fiebre de heno". Otras alrgenos pueden ocasionar rinitis alrgica persistente (rinitis alrgica perenne) como aquellos que contienen los caros del polvo del hogar, el pelaje de las mascotas y las esporas del moho.  SNTOMAS  Congestin nasal.  Picazn y goteo de la nariz con estornudos y lagrimeo de los ojos.  Generalmente, tambin puede haber picazn de la boca, ojos y odos. Las alergias no pueden curarse pero pueden controlarse con medicamentos. DIAGNSTICO Si no reconoce exactamente cul es el alrgeno que le ocasiona el problema, podrn realizarle pruebas de [place], o de piel para determinarlo. TRATAMIENTO  Evite el alrgeno.  Podrn ser tiles medicamentos y vacunas para la alergia (inmunoterapia).  Con frecuencia la fiebre de heno se trata simplemente con antihistamnicos en forma de pldoras o sprays nasales. Los antihistamnicos bloquean los efectos de la histamina. Existen medicamentos de venta libre que lo ayudarn a Associate Professor, la congestin nasal y la hinchazn alrededor de los ojos. Consulte con el profesional antes de tomar  o Civil Service fast streamer. Si estos medicamentos no le Merchant navy officer, existen muchos otros nuevos que el profesional que lo asiste puede prescribirle. Si las medidas iniciales no son efectivas, podrn utilizarse medicamentos ms fuertes. Las inyecciones desensibilizantes pueden utilizarse si los otros medicamentos fracasan. La desensibilizacin aparece cuando un paciente recibe inyecciones continuas hasta que el cuerpo se vuelve menos sensible al alrgeno. Asegrese de Education officer, environmental un seguimiento con el profesional que lo asiste si los problemas continan. SOLICITE ANTENCIN MDICA SI:   Le sube la temperatura a ms de 100.5 F (38.1 C).  Presenta tos que no se alivia (persistente).  Le falta el aire.  Comienza a respirar con dificultad.  Los sntomas interfieren con las actividades diarias. Document Released: 05/14/2005 Document Revised: 10/27/2011 [facility] Patient Information 2013 [place], [place].  Lactancia materna  (Breastfeeding) Decidir Museum/gallery exhibitions officer es una de las mejores elecciones que puede hacer por usted y su beb. La informacin que se brinda a Psychologist, clinical dar una breve visin de los beneficios de la lactancia materna as como de las dudas ms frecuentes alrededor de ella.  LOS BENEFICIOS DE AMAMANTAR  Para el beb   La primera leche (calostro ) ayuda al mejor funcionamiento del sistema digestivo del beb.   La leche tiene anticuerpos que provienen de la madre y que ayudan a prevenir las infecciones en el beb.   El beb tiene una menor incidencia de asma, alergias y del sndrome de muerte sbita del lactante (SMSL).   Los nutrientes en la [place] materna son mejores para el beb que los preparados para lactantes y la [place] materna ayuda a un mejor desarrollo del cerebro del beb.   Los bebs amamantados tienen menos gases, clicos y estreimiento.  Para la mam   La lactancia materna favorece el desarrollo de un vnculo muy especial entre la madre y el beb.    Es ms conveniente, siempre disponible y a Landscape architect y Film/video editor.   Consume caloras en la madre y la ayuda a perder el peso ganado durante el [place].   Favorece la contraccin del tero a su tamao normal, de manera ms rpida y [employer] las hemorragias luego del [place].   Las [employer] que amamantan tienen menor riesgo de Geophysical data processor de mama.  FRECUENCIA DEL AMAMANTAMIENTO   Un beb sano, nacido a trmino, puede amamantarse con tanta frecuencia como cada hora, o espaciar las comidas cada tres horas.   Observe al beb cuando manifieste signos de hambre. Amamante a su beb si muestra signos de hambre. Esta frecuencia variar de un beb a otro.   Amamntelo tan seguido como el beb lo solicite, o cuando usted sienta la necesidad de Paramedic sus [place].   Despierte al beb si han pasado 3  4 horas desde la ltima comida.   El amamantamiento frecuente la ayudar a producir ms [place] y a Education officer, community de Engineer, mining en los pezones e hinchazn de las [place].  POSICIN DEL BEBE PARA EL AMAMANTAMIENTO   Ya sea que se encuentre [place] o sentada, asegrese que el abdomen del beb enfrente el suyo.   Sostenga la mama con el pulgar por arriba y los otros 4 dedos por debajo. Asegrese que sus dedos se encuentren lejos del pezn y de la boca del beb.   Empuje suavemente los labios del beb con el pezn o con el dedo.   Cuando la boca del beb se abra lo suficiente, introduzca el pezn y la areola tanto como le sea posible dentro de la boca.   Coloque al beb cerca suyo de modo que su nariz y mejillas toquen las mamas al [employer].  ALIMENTACIN Y SUCCIN   La duracin de cada comida vara de un beb a otro y de Neomia Dear comida a [place].   El beb debe succionar entre 2 y 3 minutos para que Development worker, international aid. Esto se denomina "bajada". Por este motivo, permita que el nio se alimente en cada mama todo lo que desee. Terminar de mamar cuando haya recibido la cantidad  [place] de nutrientes.   Para detener la succin coloque su dedo en la comisura de la boca del nio y Midwife entre sus encas antes de quitarle la mama de la boca. Esto la ayudar a English as a second language teacher.  COMO SABER SI EL BEB OBTIENE LA SUFICIENTE LECHE MATERNA  Preguntarse si el beb obtiene la cantidad suficiente de [place] es una preocupacin frecuente [employer]. Puede asegurarse que el beb tiene la leche suficiente si:   El beb succiona activamente y usted escucha que traga .   El beb parece estar relajado y satisfecho despus de Psychologist, clinical.   El nio se alimenta al menos 8 a 12 veces en 24 horas. Alimntelo hasta que se desprenda por sus propios medios o se quede dormido en la primera mama (al menos durante 10 a 20 minutos), luego ofrzcale el otro lado.   El beb moja 5 a 6 paales desechables (6 a 8 paales de tela) en 24 horas cuando tiene 5  6 das de vida.   Tiene al [employer] 3 a 4 deposiciones todos los [employer] primeros meses. La materia fecal debe ser blanda y [place].   El beb debe aumentar 4  a 6 libras (120 a 170 gr.) por semana despus de los 4 [address] de vida.   Siente que las mamas se ablandan despus de amamantar  REDUCIR LA CONGESTIN DE LAS MAMAS   Durante la primera semana despus del [place], usted puede experimentar hinchazn en las mamas. Cuando las mamas estn congestionadas, se sienten calientes, llenas y molestas al tacto. Puede reducir la congestin si:   Lo amamanta frecuentemente, cada 2-3 horas. Las mams que [employer] pronto y con frecuencia tienen menos problemas de [place].   Coloque compresas de hielo en sus mamas durante 10-20 minutos entre cada amamantamiento. Esto ayuda a Building services engineer. Envuelva las bolsas de hielo en una toalla liviana para proteger su piel. Las bolsas de vegetales congelados funcionan bien para este propsito.   Tome una ducha tibia o aplique compresas hmedas calientes en las mamas durante 5  a 10 minutos antes de cada vez que [place]. Esto aumenta la circulacin y [place] a que la [place].   Masajee suavemente la mama antes y Psychologist, sport and exercise. Con las puntas de los dedos, masajee desde la pared torcica hacia abajo hasta llegar al pezn, con movimientos circulares.   Asegrese que el nio vaca al menos una mama antes de cambiar de lado.   Use un sacaleche para vaciar la mama si el beb se duerme o no se alimenta bien. Tambin podr [employer] con esa bomba si tiene que volver al trabajo o siente que las mamas estn congestionadas.   Evite los biberones, chupetes o complementar la alimentacin con agua o jugos en lugar de la [place]. La leche materna es todo el alimento que el beb necesita. No es necesario que el nio ingiera agua o preparados de bibern. Louann Liv, es lo mejor para ayudar a que las mamas produzcan ms [place]. no darle suplementos al [employer] las primeras semanas.   Verifique que el beb se encuentra en la posicin correcta mientras lo alimenta.   Use un sostn que soporte bien sus mamas y [employer] que tienen aro.   Consuma una dieta balanceada y beba lquidos en cantidad.   Descanse con frecuencia, reljese y tome sus vitaminas prenatales para evitar la fatiga, el estrs y la anemia.  Si sigue estas indicaciones, la congestin debe mejorar en 24 a 48 horas. Si an tiene dificultades, consulte a Barista.  CUDESE USTED MISMA  Cuide sus mamas.   Bese o dchese diariamente.   Evite usar [employer].   Comience a amamantar del lado izquierdo en una comida y del lado derecho en la siguiente.   Notar que aumenta el flujo de [place] a los 2 a 5 [address] despus del [address]. Puede sentir algunas molestias por la congestin, lo que hace que sus mamas estn duras y sensibles. La congestin disminuye en 24 a 48 horas. Mientras tanto, aplique toallas hmedas calientes durante 5 a 10 minutos antes de amamantar. Un  masaje suave y la extraccin de un poco de leche antes de Museum/gallery exhibitions officer ablandarn las mamas y har ms fcil que el beb se agarre.   Use un buen sostn y seque al aire los pezones durante 3 a 4 minutos luego de Wellsite geologist.   Solo utilice apsitos de algodn.   Utilice lanolina [employer] pezones luego de [place]. No necesita lavarlos luego de alimentar al [employer]. Otra opcin es exprimir algunas gotas de [place] y [employer] pezones.  Cumpla con estos cuidados  Consuma alimentos bien balanceados y refrigerios nutritivos.   Dixie Dials, jugos de fruta y agua para Warehouse manager sed (alrededor de 8 vasos por Futures trader).   Descanse lo suficiente.  Evite los alimentos que usted note que pueden afectar al beb.  SOLICITE ATENCIN MDICA SI:   Tiene dificultad con la lactancia materna y [place].   Tiene una zona de color rojo, dura y dolorosa en la mama que se acompaa de [place].   El beb est muy somnoliento como para alimentarse bien o tiene problemas para dormir.   Su beb moja menos de 6 paales al [address], a los 5 [address]  [address] de vida.   La piel del beb o la parte blanca de sus ojos est ms amarilla de lo que estaba en el hospital.   Se siente deprimida.  Document Released: 08/04/2005 Document Revised: 02/03/2012 [facility] Patient Information 2013 [place], [place].

## 2012-12-29 NOTE — Progress Notes (Signed)
Allergies, and OTC treatment given.

## 2012-12-29 NOTE — Progress Notes (Signed)
Having some constipation, nasal congestion, due to allergies.

## 2012-12-29 NOTE — Addendum Note (Signed)
Addended by: Barbara Cower on: 12/29/2012 11:57 AM   Modules accepted: Orders

## 2013-01-12 ENCOUNTER — Ambulatory Visit (INDEPENDENT_AMBULATORY_CARE_PROVIDER_SITE_OTHER): Payer: Self-pay | Admitting: Obstetrics & Gynecology

## 2013-01-12 VITALS — BP 115/79 | Wt 178.8 lb

## 2013-01-12 DIAGNOSIS — Z3480 Encounter for supervision of other normal pregnancy, unspecified trimester: Secondary | ICD-10-CM

## 2013-01-12 DIAGNOSIS — Z3483 Encounter for supervision of other normal pregnancy, third trimester: Secondary | ICD-10-CM

## 2013-01-12 NOTE — Patient Instructions (Signed)
Embarazo  Tercer trimestre  (Pregnancy - Third Trimester) El tercer trimestre del embarazo (los ltimos 3 meses) es el perodo en el cual tanto usted como su beb crecen con ms rapidez. El beb alcanza un largo de aproximadamente 50 cm. y pesa entre 2,700 y 4,500 kg. El beb gana ms tejido graso y est listo para la vida fuera del cuerpo de la madre. Mientras estn en el interior, los bebs tienen perodos de sueo y vigilia, succionan el pulgar y tienen hipo. Quizs sienta pequeas contracciones del tero. Este es el falso trabajo de parto. Tambin se las conoce como contracciones de Braxton-Hicks . Es como una prctica del parto. Los problemas ms habituales de esta etapa del embarazo incluyen mayor dificultad para respirar, hinchazn de las manos y los pies por retencin de lquidos y la necesidad de orinar con ms frecuencia debido a que el tero y el beb presionan sobre la vejiga.  EXAMENES PRENATALES   Durante los exmenes prenatales, deber seguir realizndose anlisis de sangre. Estas pruebas se realizan para controlar su salud y la del beb. Los anlisis de sangre se realizan para conocer los niveles de algunos compuestos de la sangre (hemoglobina). La anemia (bajo nivel de hemoglobina) es frecuente durante el embarazo. Para prevenirla, se administran hierro y vitaminas. Tambin le tomarn nuevas anlisis para descartar diabetes. Podrn repetirle algunas de las pruebas que le hicieron previamente.  En cada visita le medirn el tamao del tero. Esto permite asegurar que el beb se desarrolla adecuadamente, segn la fecha del embarazo.  Le controlarn la presin arterial en cada visita prenatal. Esto es para asegurarse de que no sufre toxemia.  Le harn un anlisis de orina en cada visita prenatal, para descartar infecciones, diabetes y la presencia de protenas.  Tambin en cada visita controlarn su peso. Esto se realiza para asegurarse que aumenta de peso al ritmo indicado y que usted y su  beb evolucionan normalmente.  En algunas ocasiones se realiza una prueba de ultrasonido para confirmar el correcto desarrollo y evolucin del beb. Esta prueba se realiza con ondas sonoras inofensivas para el beb, de modo que el profesional pueda calcular ms precisamente la fecha del parto.  Analice con su mdico los analgsicos y la anestesia que recibir durante el trabajo de parto y el parto.  Comente la posibilidad de que necesite una cesrea y qu anestesia se recibir.  Informe a su mdico si sufre violencia familiar mental o fsica. A veces, se indica la prueba especializada sin estrs, la prueba de tolerancia a las contracciones y el perfil biofsico para asegurarse de que el beb no tiene problemas. El estudio del lquido amnitico que rodea al beb se llama amniocentesis. El lquido amnitico se obtiene introduciendo una aguja en el vientre (abdomen ). En ocasiones se lleva a cabo cerca del final del embarazo, si es necesario inducir a un parto. En este caso se realiza para asegurarse que los pulmones del beb estn lo suficientemente maduros como para que pueda vivir fuera del tero. Si los pulmones no han madurado y es peligroso que el beb nazca, se administrar a la madre una inyeccin de cortisona , 1 a 2 das antes del parto. . Esto ayuda a que los pulmones del beb maduren y sea ms seguro su nacimiento.  CAMBIOS QUE OCURREN EN EL TERCER TRIMESTRE DEL EMBARAZO  Su organismo atravesar numerosos cambios durante el embarazo. Estos pueden variar de una persona a otra. Converse con el profesional que la asiste acerca los cambios que   usted note y que la preocupen.   Durante el ltimo trimestre probablemente sienta un aumento del apetito. Es normal tener "antojos" de ciertas comidas. Esto vara de una persona a otra y de un embarazo a otro.  Podrn aparecer las primeras estras en las caderas, abdomen y mamas. Estos son cambios normales del cuerpo durante el embarazo. No existen  medicamentos ni ejercicios que puedan prevenir estos cambios.  La constipacin puede tratarse con un laxante o agregando fibra a su dieta. Beber grandes cantidades de lquidos, tomar fibras en forma de vegetales, frutas y granos integrales es de gran ayuda.  Tambin es beneficioso practicar actividad fsica. Si ha sido una persona activa hasta el embarazo, podr continuar con la mayora de las actividades durante el mismo. Si ha sido menos activa, puede ser beneficioso que comience con un programa de ejercicios, como realizar caminatas. Consulte con el profesional que la asiste antes de comenzar un programa de ejercicios.  Evite el consumo de cigarrillos, el alcohol, los medicamentos no recetados y las "drogas de la calle" durante el embarazo. Estas sustancias qumicas afectan la formacin y el desarrollo del beb. Evite estas sustancias durante todo el embarazo para asegurar el nacimiento de un beb sano.  Podr sentir dolor de espalda, tener vrices en las venas y hemorroides, o si ya los sufra, pueden empeorar.  Durante el tercer trimestre se cansar con ms facilidad, lo cual es normal.  Los movimientos del beb pueden ser ms fuertes y con ms frecuencia.  Puede que note dificultades para respirar normalmente.  El ombligo puede salir hacia afuera.  A veces sale una secrecin amarilla de las mamas, que se llama calostro.  Podr aparecer una secrecin mucosa con sangre. Esto suele ocurrir entre unos pocos das y una semana antes del parto. INSTRUCCIONES PARA EL CUIDADO EN EL HOGAR   Cumpla con las citas de control. Siga las indicaciones del mdico con respecto al uso de medicamentos, los ejercicios y la dieta.  Durante el embarazo debe obtener nutrientes para usted y para su beb. Consuma alimentos balanceados a intervalos regulares. Elija alimentos como carne, pescado, leche y otros productos lcteos descremados, vegetales, frutas, panes integrales y cereales. El mdico le informar  cul es el aumento de peso ideal.  Las relaciones sexuales pueden continuarse hasta casi el final del embarazo, si no se presentan otros problemas como prdida prematura (antes de tiempo) de lquido amnitico, hemorragia vaginal o dolor en el vientre (abdominal).  Realice actividad fsica todos los das, si no tiene restricciones. Consulte con el profesional que la asiste si no sabe con certeza si determinados ejercicios son seguros. El mayor aumento de peso se producir en los ltimos 2 trimestres del embarazo. El ejercicio ayuda a:  Controlar su peso.  Mantenerse en forma para el trabajo de parto y el parto .  Perder peso despus del parto.  Haga reposo con frecuencia, con las piernas elevadas, o segn lo necesite para evitar los calambres y el dolor de cintura.  Use un buen sostn o como los que se usan para hacer deportes para aliviar la sensibilidad de las mamas. Tambin puede serle til si lo usa mientras duerme. Si pierde calostro, podr utilizar apsitos en el sostn.  No utilice la baera con agua caliente, baos turcos y saunas.   Colquese el cinturn de seguridad cuando conduzca. Este la proteger a usted y al beb en caso de accidente.  Evite comer carne cruda y el contacto con los utensilios y desperdicios de los gatos. Estos   elementos contienen grmenes que pueden causar defectos de nacimiento en el beb.  Es fcil perder algo de orina durante el embarazo. Apretar y fortalecer los msculos de la pelvis la ayudar con este problema. Practique detener la miccin cuando est en el bao. Estos son los mismos msculos que necesita fortalecer. Son tambin los mismos msculos que utiliza cuando trata de evitar despedir gases. Puede practicar apretando estos msculos diez veces, y repetir esto tres veces por da aproximadamente. Una vez que conozca qu msculos debe apretar, no realice estos ejercicios durante la miccin. Puede favorecerle una infeccin si la orina vuelve hacia  atrs.  Pida ayuda si tienen necesidades financieras, teraputicas o nutricionales. El profesional podr ayudarla con respecto a estas necesidades, o derivarla a otros especialistas.  Haga una lista de nmeros telefnicos de emergencia y tngalos disponibles.  Planifique como obtener ayuda de familiares o amigos cuando regrese a casa desde el hospital.  Hacer un ensayo sobre la partida al hospital.  Tome clases prenatales con el padre para entender, practicar y hacer preguntas sobre el trabajo de parto y el alumbramiento.  Preparar la habitacin del beb / busque una guardera.  No viaje fuera de la ciudad a menos que sea absolutamente necesario y con el asesoramiento de su mdico.  Use slo zapatos de tacn bajo o sin tacn para tener mejor equilibrio y evitar cadas. USO DE MEDICAMENTOS Y CONSUMO DE DROGAS DURANTE EL EMBARAZO   Tome las vitaminas apropiadas para esta etapa tal como se le indic. Las vitaminas deben contener un miligramo de cido flico. Guarde todas las vitaminas fuera del alcance de los nios. La ingestin de slo un par de vitaminas o tabletas que contengan hierro pueden ocasionar la muerte en un beb o en un nio pequeo.  Evite el uso de todos los medicamentos, incluyendo hierbas, medicamentos de venta libre, sin receta o que no hayan sido sugeridos por su mdico. Slo tome medicamentos de venta libre o medicamentos recetados para el dolor, el malestar o fiebre como lo indique su mdico. No tome aspirina, ibuprofeno o naproxeno excepto que su mdico se lo indique.  Infrmele al profesional si consume alguna droga.  El alcohol se relaciona con ciertos defectos congnitos. Incluye el sndrome de alcoholismo fetal. Debe evitar absolutamente el consumo de alcohol, en cualquier forma. El fumar produce baja tasa de natalidad y bebs prematuros.  Las drogas ilegales o de la calle son muy perjudiciales para el beb. Estn absolutamente prohibidas. Un beb que nace de una  madre adicta, ser adicto al nacer. Ese beb tendr los mismos sntomas de abstinencia que un adulto. SOLICITE ATENCIN MDICA SI:  Tiene preguntas o preocupaciones relacionadas con el embarazo. Es mejor que llame para formular las preguntas si no puede esperar hasta la prxima visita, que sentirse preocupada por ellas.  SOLICITE ATENCIN MDICA DE INMEDIATO SI:   La temperatura oral le sube a ms de 38,9 C (102 F) o lo que su mdico le indique.  Tiene una prdida de lquido por la vagina (canal de parto). Si sospecha una ruptura de las membranas, tmese la temperatura y llame al profesional para informarlo sobre esto.  Observa unas pequeas manchas, una hemorragia vaginal o elimina cogulos. Notifique al profesional acerca de la cantidad y de cuntos apsitos est utilizando.  Presenta un olor desagradable en la secrecin vaginal y observa un cambio en el color, de transparente a blanco.  Ha vomitado durante ms de 24 horas.  Siente escalofros o le sube la fiebre.  Le   falta el aire.  Siente ardor al orinar.  [place] o sube ms de 2 libras (900 g), o segn lo indicado por el profesional que la asiste.  Observa que sbitamente se le hinchan el rostro, las manos, los pies o las piernas.  Siente dolor en el vientre (abdominal). Las molestias en el ligamento redondo son una causa benigna frecuente de dolor abdominal durante el embarazo. El profesional que la asiste deber evaluarla.  Presenta dolor de cabeza intenso que no se alivia.  Tiene problemas visuales, visin doble o borrosa.  Si no siente los movimientos del beb durante ms de 1 hora. Si piensa que el beb no se mueve tanto como lo haca habitualmente, coma algo que contenga azcar y recustese sobre el lado izquierdo durante una hora. El beb debe moverse al menos 4  5 veces por hora. Comunquese inmediatamente si el beb se mueve menos que lo indicado.  Se cae, se ve involucrada en un accidente automovilstico o sufre algn  tipo de traumatismo.  En su hogar hay violencia mental o fsica. Document Released: 05/14/2005 Document Revised: 04/28/2012 [employer] Patient Information 2014 [employer].  

## 2013-01-12 NOTE — Progress Notes (Signed)
Lower abdominal pressure no UC or other problems

## 2013-01-19 ENCOUNTER — Ambulatory Visit (INDEPENDENT_AMBULATORY_CARE_PROVIDER_SITE_OTHER): Payer: Self-pay | Admitting: Family Medicine

## 2013-01-19 VITALS — BP 117/70 | Wt 179.0 lb

## 2013-01-19 DIAGNOSIS — Z3480 Encounter for supervision of other normal pregnancy, unspecified trimester: Secondary | ICD-10-CM

## 2013-01-19 DIAGNOSIS — Z3483 Encounter for supervision of other normal pregnancy, third trimester: Secondary | ICD-10-CM

## 2013-01-19 NOTE — Assessment & Plan Note (Signed)
Doing well 

## 2013-01-19 NOTE — Progress Notes (Signed)
[id number]  

## 2013-01-19 NOTE — Patient Instructions (Addendum)
You may use Nasacort 24 for allergies and congestion. Embarazo  Systems analyst trimestre  (Pregnancy - Third Trimester) El tercer trimestre del Psychiatrist (los ltimos 3 meses) es el perodo en el cual tanto usted como su beb crecen con ms rapidez. El beb alcanza un largo de aproximadamente 50 cm. y pesa entre 2,700 y 4,500 kg. El beb gana ms tejido graso y est listo para la vida fuera del cuerpo de la [place]. Mientras estn en el interior, los bebs tienen perodos de sueo y vigilia, Warehouse manager y tienen hipo. Quizs sienta pequeas contracciones del tero. Este es el falso trabajo de [place]. Tambin se las conoce como contracciones de Braxton-Hicks . Es como una prctica del parto. Los problemas ms habituales de esta etapa del embarazo incluyen mayor dificultad para respirar, hinchazn de las manos y los pies por retencin de lquidos y la necesidad de Geographical information systems officer con ms frecuencia debido a que el tero y el beb presionan sobre la vejiga.  EXAMENES PRENATALES   Durante los Manpower Inc, deber seguir realizndose anlisis de [place]. Estas pruebas se realizan para controlar su salud y la del beb. Los ARAMARK Corporation de sangre se Radiographer, therapeutic para [employer] Mutual niveles de algunos compuestos de la sangre (hemoglobina). La anemia (bajo nivel de hemoglobina) es frecuente durante el embarazo. Para prevenirla, se administran hierro y vitaminas. Tambin le tomarn nuevas anlisis para descartar diabetes. Podrn repetirle algunas de las Hovnanian Enterprises hicieron previamente.  En cada visita le medirn el tamao del tero. Esto permite asegurar que el beb se desarrolla adecuadamente, segn la fecha del embarazo.  Le controlarn la presin arterial en cada visita prenatal. Esto es para asegurarse de que no sufre toxemia.  Le harn un anlisis de orina en cada visita prenatal, para descartar infecciones, diabetes y la presencia de protenas.  Tambin en cada visita controlarn su peso. Esto se realiza para asegurarse  que aumenta de peso al ritmo indicado y que usted y su beb evolucionan normalmente.  En algunas ocasiones se realiza una prueba de ultrasonido para confirmar el correcto desarrollo y evolucin del beb. Esta prueba se realiza con ondas sonoras inofensivas para el beb, de modo que el profesional pueda calcular ms precisamente la fecha del [place].  Analice con su mdico los analgsicos y la anestesia que recibir durante el [place] de parto y [place].  Comente la posibilidad de que necesite una cesrea y qu anestesia se recibir.  Informe a su mdico si sufre violencia familiar mental o fsica. A veces, se indica la prueba especializada sin estrs, la prueba de tolerancia a las contracciones y el perfil biofsico para asegurarse de que el beb no tiene problemas. El estudio del lquido amnitico que rodea al beb se llama amniocentesis. El lquido amnitico se obtiene introduciendo una aguja en el vientre (abdomen ). En ocasiones se lleva a cabo cerca del final del embarazo, si es necesario inducir a un parto. En este caso se realiza para asegurarse que los pulmones del beb estn lo suficientemente maduros como para que pueda vivir fuera del tero. Si los pulmones no han madurado y es peligroso que el beb nazca, se Building services engineer a la madre una inyeccin de [place] , 1 a 2 [address]  [address] antes del [address]. Vivia Budge ayuda a que los pulmones del beb maduren y sea ms seguro su nacimiento.  CAMBIOS QUE OCURREN EN EL TERCER TRIMESTRE DEL EMBARAZO  Su organismo atravesar numerosos cambios durante el [place]. Estos pueden variar de Neomia Dear persona a otra. Boyd Kerbs con  el profesional que la asiste acerca los cambios que usted note y que la preocupen.   Durante el ltimo trimestre probablemente sienta un aumento del apetito. Es normal tener "antojos" de Development worker, community. Esto vara de Neomia Dear persona a otra y de un embarazo a Therapist, art.  Podrn aparecer las primeras estras en las caderas, abdomen y [place]. Estos son cambios  normales del cuerpo durante el [place]. No existen medicamentos ni ejercicios que puedan prevenir CarMax.  La constipacin puede tratarse con un laxante o agregando fibra a su dieta. Beber grandes cantidades de lquidos, tomar fibras en forma de vegetales, frutas y granos integrales es de gran [place].  Tambin es beneficioso practicar actividad fsica. Si ha sido una persona Engineer, mining, podr continuar con la Harley-Davidson de las actividades durante el mismo. Si ha sido [employer] Family Insurance, puede ser beneficioso que comience con un programa de ejercicios, Museum/gallery exhibitions officer. Consulte con el profesional que la asiste antes de comenzar un programa de ejercicios.  Evite el consumo de cigarrillos, el alcohol, los medicamentos no recetados y las "drogas de la calle" durante el Psychiatrist. Estas sustancias qumicas afectan la formacin y el desarrollo del beb. Evite estas sustancias durante todo el embarazo para asegurar el nacimiento de un beb sano.  Podr sentir dolor de espalda, tener vrices en las venas y hemorroides, o si ya los sufra, pueden [place].  Durante el tercer trimestre se cansar con ms facilidad, lo cual es normal.  Los movimientos del beb pueden ser ms fuertes y con ms frecuencia.  Puede que note dificultades para respirar normalmente.  El ombligo puede salir hacia afuera.  A veces sale Veterinary surgeon de las [place], que se llama Product manager.  Podr aparecer Neomia Dear secrecin mucosa con sangre. Esto suele ocurrir General Electric unos [address] y Neomia Dear semana antes del [place]. INSTRUCCIONES PARA EL CUIDADO EN EL HOGAR   Cumpla con las citas de control. Siga las indicaciones del mdico con respecto al uso de [place], los ejercicios y la dieta.  Durante el embarazo debe obtener nutrientes para usted y para su beb. Consuma alimentos balanceados a intervalos regulares. Elija alimentos como carne, pescado, [place] y otros productos lcteos descremados, vegetales, frutas,  panes integrales y cereales. El Office Depot informar cul es el aumento de peso ideal.  Las relaciones sexuales pueden continuarse hasta casi el final del embarazo, si no se presentan otros problemas como prdida prematura (antes de [place]) de lquido amnitico, hemorragia vaginal o dolor en el vientre (abdominal).  Realice Tesoro Corporation, si no tiene restricciones. Consulte con el profesional que la asiste si no sabe con certeza si determinados ejercicios son seguros. El mayor aumento de peso se producir en los ltimos 2 trimestres del Psychiatrist. El ejercicio ayuda a:  Engineering geologist.  Mantenerse en forma para el trabajo de parto y [place] .  Perder peso despus del parto.  Haga reposo con frecuencia, con las piernas elevadas, o segn lo necesite para evitar los calambres y el dolor de cintura.  Use un buen sostn o como los que se usan para hacer deportes para Paramedic la sensibilidad de las [place]. Tambin puede serle til si lo [place] mientras duerme. Si pierde Product manager, podr Parker Hannifin.  No utilice la baera con agua caliente, baos turcos y saunas.   Colquese el cinturn de seguridad cuando conduzca. Este la proteger a usted y al beb en caso de accidente.  Evite comer carne cruda y el contacto  con los utensilios y desperdicios de los gatos. Estos elementos contienen grmenes que pueden causar defectos de nacimiento en el beb.  Es fcil perder algo de orina durante el [place]. Apretar y Chief Operating Officer los msculos de la pelvis la ayudar con este problema. Practique detener la miccin cuando est en el bao. Estos son los mismos msculos que Development worker, international aid. Son TEPPCO Partners mismos msculos que utiliza cuando trata de evitar despedir gases. Puede practicar apretando estos msculos WellPoint, y repetir esto tres veces por da aproximadamente. Una vez que conozca qu msculos debe apretar, no realice estos ejercicios durante la miccin. Puede  favorecerle una infeccin si la orina vuelve hacia atrs.  Pida ayuda si tienen necesidades financieras, teraputicas o nutricionales. El profesional podr ayudarla con respecto a estas necesidades, o derivarla a otros especialistas.  Haga una lista de nmeros telefnicos de emergencia y tngalos disponibles.  Planifique como obtener ayuda de familiares o amigos cuando regrese a Programmer, applications hospital.  Hacer un ensayo sobre la partida al hospital.  [place] clases prenatales con el padre para entender, practicar y hacer preguntas sobre el [place] de parto y el alumbramiento.  Preparar la habitacin del beb / busque Fatima Blank.  No viaje fuera de la ciudad a menos que sea absolutamente necesario y con el asesoramiento de su mdico.  Use slo zapatos de tacn bajo o sin tacn para tener mejor equilibrio y Automotive engineer cadas. USO DE MEDICAMENTOS Y CONSUMO DE DROGAS DURANTE EL [facility]   Tome las vitaminas apropiadas para esta etapa tal como se le indic. Las vitaminas deben contener un miligramo de cido flico. Guarde todas las vitaminas fuera del alcance de los nios. La ingestin de slo un par de vitaminas o tabletas que contengan hierro pueden ocasionar la Newmont Mining en un beb o en un nio pequeo.  Evite el uso de The Mutual of Omaha, incluyendo hierbas, medicamentos de [place], sin receta o que no hayan sido sugeridos por su mdico. Slo tome medicamentos de venta libre o medicamentos recetados para Chief Technology Officer, Environmental health practitioner o fiebre como lo indique su mdico. No tome aspirina, ibuprofeno o naproxeno excepto que su mdico se lo indique.  Infrmele al profesional si consume alguna droga.  El alcohol se relaciona con ciertos defectos congnitos. Incluye el sndrome de alcoholismo fetal. Debe evitar absolutamente el consumo de alcohol, en cualquier forma. El fumar produce baja tasa de natalidad y bebs prematuros.  Las drogas ilegales o de la calle son muy perjudiciales para el beb. Estn  absolutamente prohibidas. Un beb que nace de American Express, ser adicto al nacer. Ese beb tendr los mismos sntomas de abstinencia que un adulto. SOLICITE ATENCIN MDICA SI:  Tiene preguntas o preocupaciones relacionadas con el embarazo. Es mejor que llame para formular las preguntas si no puede esperar hasta la prxima visita, que sentirse preocupada por ellas.  SOLICITE ATENCIN MDICA DE INMEDIATO SI:   La temperatura oral le sube a ms de 38,9 C (102 F) o lo que su mdico le indique.  Tiene una prdida de lquido por la vagina (canal de parto). Si sospecha una ruptura de las [place], tmese la temperatura y llame al profesional para informarlo sobre esto.  Observa unas pequeas manchas, una hemorragia vaginal o elimina cogulos. Notifique al profesional acerca de la cantidad y de cuntos apsitos est utilizando.  Presenta un olor desagradable en la secrecin vaginal y observa un cambio en el color, de transparente a blanco.  Ha vomitado durante ms de 24 horas.  Siente escalofros o le sube la fiebre.  Le falta el aire.  Siente ardor al Beatrix Shipper.  Baja o sube ms de 2 libras (900 g), o segn lo indicado por el profesional que la asiste.  Observa que sbitamente se le hinchan el rostro, las manos, los pies o las piernas.  Siente dolor en el vientre (abdominal). Las Federal-Mogul en el ligamento redondo son Neomia Dear causa benigna frecuente de dolor abdominal durante el embarazo. El profesional que la asiste deber evaluarla.  Presenta dolor de cabeza intenso que no se [place].  Tiene problemas visuales, visin doble o borrosa.  Si no siente los movimientos del beb durante ms de 1 hora. Si piensa que el beb no se mueve tanto como lo haca habitualmente, coma algo que Psychologist, clinical y Target Corporation lado izquierdo durante [place]. El beb debe moverse al menos 4  5 veces por hora. Comunquese inmediatamente si el beb se mueve menos que lo indicado.  Se cae, se ve  involucrada en un accidente automovilstico o sufre algn tipo de traumatismo.  En su hogar hay violencia mental o fsica. Document Released: 05/14/2005 Document Revised: 04/28/2012 [facility] Patient Information 2014 [place], [place].  Lactancia materna  (Breastfeeding)  El cambio hormonal durante el Psychiatrist produce el desarrollo del tejido [place] y un aumento en el nmero y tamao de los conductos galactforos. La hormona prolactina permite que las protenas, los azcares y las grasas de la sangre produzcan la W[employer] Resources materna en las glndulas productoras de [place]. La hormona progesterona impide que la leche materna sea liberada antes del nacimiento del beb. Despus del nacimiento del beb, su nivel de progesterona disminuye permitiendo que la leche materna sea [place]. Pensar en el beb, as como la succin o Theatre manager, pueden estimular la liberacin de [place] de las glndulas productoras de [place].  La decisin de Company secretary) es una de las mejores opciones que usted puede hacer para usted y su beb. La informacin que sigue da una breve resea de los beneficios, as Lexicographer que debe saber sobre la [place].  LOS BENEFICIOS DE AMAMANTAR  Para el beb   La primera leche (calostro) ayuda al mejor funcionamiento del sistema digestivo del beb.   La leche tiene anticuerpos que provienen de la madre y que ayudan a prevenir las infecciones en el beb.   El beb tiene una menor incidencia de asma, alergias y del sndrome de muerte sbita del lactante (SMSL).   Los nutrientes de la [place] materna son mejores para el beb que la [place].  La leche materna mejora el desarrollo cerebral del beb.   Su beb tendr menos gases, clicos y estreimiento.  Es menos probable que el beb desarrolle otras enfermedades, como obesidad infantil, asma o diabetes mellitus. Para usted   La lactancia materna favorece el desarrollo de un vnculo muy  especial entre la madre y el beb.   Es ms conveniente, siempre disponible, a la [place] y [place].   La lactancia materna ayuda a quemar caloras y a perder el peso ganado durante el [place].   Hace que el tero se contraiga ms rpidamente a su tamao normal y Consolidated Edison sangrado despus del [place].   Las M.D.C. Holdings que amamantan tienen menos riesgo de Environmental education officer osteoporosis o cncer de mama o de ovario en el futuro.  FRECUENCIA DEL AMAMANTAMIENTO   Un beb sano, nacido a trmino, puede amamantarse con tanta frecuencia como cada hora, o espaciar las comidas cada tres horas. La  frecuencia en la lactancia varan de un beb a otro.   Los recin nacidos deben ser alimentados por lo menos cada 2-3 horas Administrator y cada 4-5 horas durante la noche. Usted debe amamantarlo un mnimo de 8 tomas en un perodo de 24 horas.  Despierte al beb para amamantarlo si han pasado 3-4 horas desde la ltima comida.  Amamante cuando sienta la necesidad de reducir la plenitud de sus senos o cuando el beb muestre signos de [place]. Las seales de que el beb puede Gentry Fitz son:  Lenora Boys su estado de alerta o vigilancia.  Se estira.  Mueve la cabeza de un lado a otro.  Mueve la cabeza y abre la boca cuando se le toca la mejilla o la boca (reflejo de succin).  Aumenta las vocalizaciones, tales como sonidos de succin, relamerse los labios, arrullos, suspiros, o chirridos.  Mueve la [employer]arel Group boca.  Se chupa con ganas los dedos o las manos.  Agitacin.  Llanto intermitente.  Los signos de hambre extrema requerirn que lo calme y lo consuele antes de tratar de alimentarlo. Los signos de hambre extrema son:  Agitacin.  Llanto fuerte e intenso.  Gritos.  El amamantamiento frecuente la ayudar a producir ms [place] y a Education officer, community de Engineer, mining en los pezones e hinchazn de las [place].  LACTANCIA MATERNA   Ya sea que se encuentre acostada o sentada, asegrese  que el abdomen del beb est enfrente el suyo.   Sostenga la mama con el pulgar por arriba y los otros 4 dedos por debajo del pezn. Asegrese que sus dedos se encuentren lejos del pezn y de la boca del beb.   Empuje suavemente los labios del beb con el pezn o con el dedo.   Cuando la boca del beb se abra lo suficiente, introduzca el pezn y la zona oscura que lo rodea (areola) tanto como le sea posible dentro de la boca.  Debe haber ms areola visible por arriba del labio superior que por debajo del labio inferior.  La lengua del beb debe estar entre la enca inferior y el seno.  Asegrese de que la boca del beb est en la posicin correcta alrededor del pezn (prendida). Los labios del beb deben crear un sello sobre su pecho.  Las seales de que el beb se ha prendido eficazmente al pezn son:  Payton Doughty o succiona sin dolor.  Se escucha que traga Lyondell Chemical.  No hace ruidos ni chasquidos.  Hay movimientos musculares por arriba y por delante de sus odos al Printmaker.  El beb debe succionar unos 2-3 minutos para que salga la [place]. Permita que el nio se alimente en cada mama todo lo que desee. Alimente al beb hasta que se desprenda o se quede dormido en Freight forwarder y luego ofrzcale el segundo pecho.  Las seales de que el beb est lleno y satisfecho son:  Disminuye gradualmente el nmero de succiones o no succiona.  Se queda dormido.  Extiende o relaja su cuerpo.  Retiene una pequea cantidad de [facility] boca.  Se desprende del pecho por s mismo.  Los signos de una lactancia materna eficaz son:  Los senos han aumentado la firmeza, el peso y el tamao antes de la alimentacin.  Son ms blandos despus de amamantar.  Un aumento del volumen de [place], y tambin el cambio de su consistencia y color se producen hacia el quinto da de Tour manager.  La congestin mamaria se [place]  al dar de Lily Kocher.  Los pezones no duelen, ni estn  agrietados ni sangran.  De ser necesario, interrumpa la succin poniendo su dedo en la esquina de la boca del beb y deslizando el dedo entre sus encas. A continuacin, retire la mama de su boca.  Es comn que los bebs regurgiten un poco despus de comer.  A menudo los bebs tragan aire al alimentarse. Esto puede hacer que se sienta molesto. Hacer eructar al beb al [place] de pecho puede ser de [place].  Se recomiendan suplementos de vitamina D para los bebs que reciben slo [address]  Evite el uso del chupete durante las primeras 4 a 6 semanas de vida.  Evite la alimentacin suplementaria con agua, frmula o jugo en lugar de la Colgate Palmolive. La leche materna es todo el alimento que el beb necesita. No es necesario que el nio ingiera agua o preparados de bibern. Sus pechos producirn ms leche si se evita la alimentacin suplementaria durante las primeras semanas. COMO SABER SI EL BEB OBTIENE LA SUFICIENTE LECHE MATERNA  Preguntarse si el beb obtiene la cantidad suficiente de [place] es una preocupacin frecuente Lucent Technologies. Puede asegurarse que el beb tiene la leche suficiente si:   El beb succiona activamente y usted escucha que traga.   El beb parece estar relajado y satisfecho despus de Psychologist, clinical.   El nio se alimenta al menos 8 a 12 veces en 24 horas.  Durante los primeros 3 a 5 das de vida:  Moja 3-5 paales en 24 horas. La materia fecal debe ser blanda y [place].  Tiene al menos 3 a 4 deposiciones en 24 horas. La materia fecal debe ser blanda y [place].  A los 5-7 das de vida, el beb debe tener al menos 3-6 deposiciones en 24 horas. La materia fecal debe ser grumosa y [place] a los 5 [address]  [address] de [place].  Su beb tiene una prdida de Psychologist, counselling a 7al 10% durante los primeros 3 [address]  [address] de [address]  El beb no pierde peso despus de 3-7 [address] de [address]  El beb debe aumentar 4 a 6 libras (120 a 170 gr.) por semana despus de los 4 [address] de vida.  Aumenta de  [place] a los [address] de vida y vuelve al peso del nacimiento dentro de las 2 semanas. CONGESTIN MAMARIA  Durante la primera semana despus del [place], usted puede experimentar hinchazn en las mamas (congestin [place]). Al estar congestionadas, las mamas se sienten pesadas, calientes o sensibles al tacto. El pico de la congestin ocurre a las 24 -48 horas despus del parto.   La congestin puede disminuirse:  Continuando con la Tour manager.  Aumentando la frecuencia.  Tomando duchas calientes o aplicando calor hmedo en los senos antes de cada comida. Esto aumenta la circulacin y [place] a que la [place].   Masajeando suavemente el pecho antes y [place] [employer]. Con las yemas de los dedos, masajee la pared del pecho hacia el pezn en un movimiento circular.   Asegurarse de que el beb vaca al menos uno de sus pechos en cada alimentacin. Tambin ayuda si comienza la siguiente toma en el otro seno.   Extraiga manualmente o con un sacaleches las mamas para vaciar los pechos si el beb tiene sueo o no se aliment bien. Tambin puede extraer la WPS Resources cuando vuelva a trabajar o si siente que se estn congestionando las [place].  Asegrese de que el beb se prende y est bien colocado durante la  lactancia. Si sigue estas indicaciones, la congestin debe mejorar en 24 a 48 horas. Si an tiene dificultades, consulte a Barista.  CUDESE USTED MISMA  Cuide sus mamas.   Bese o dchese diariamente.   Evite usar Eaton Corporation.   Use un sostn de soporte Evite el uso de sostenes con aro.  Seque al aire sus pezones durante 3-4 minutos despus de cada comida.   Utilice slo apsitos de algodn en el sostn para absorber las prdidas de [place]. La prdida de un poco de Deere & Company las comidas es normal.   Use solamente lanolina pura en sus pezones despus de Museum/gallery exhibitions officer. Usted no tiene que lavarla antes de alimentar al beb. Otra opcin es sacarse unas  gotas de [place] y Pepco Holdings pezones.  Continuar con los autocontroles de la mama. Cudese.   Consuma alimentos saludables. Alterne 3 comidas con 3 colaciones.  Evite los alimentos que usted nota que perjudican al beb.  Dixie Dials, jugos de fruta y agua para Patent examiner su sed (aproximadamente 8 vasos al Futures trader).   Descanse con frecuencia, reljese y tome sus vitaminas prenatales para evitar la fatiga, el estrs y la anemia.  Evite masticar y fumar tabaco.  Evite el consumo de alcohol y drogas.  Tome medicamentos de venta libre y recetados tal como le indic su mdico o Social research officer, government. Siempre debe consultar con su mdico o farmacutico antes de tomar cualquier medicamento, vitamina o suplemento de hierbas.  Sepa que durante la lactancia puede quedar embarazada. Si lo desea, hable con su mdico acerca de la planificacin familiar y los mtodos anticonceptivos seguros que puede utilizar durante la Market researcher. SOLICITE ATENCIN MDICA SI:   Usted siente que quiere dejar de Museum/gallery exhibitions officer o se siente frustrada con la lactancia.  Siente dolor en los senos o en los pezones.  Sus pezones estn agrietados o Water quality scientist.  Sus pechos estn irritados, sensibles o calientes.  Tiene un rea hinchada en cualquiera de los senos.  Siente escalofros o fiebre.  Tiene nuseas o vmitos.  Observa un drenaje en los pezones.  Sus mamas no se llenan antes de Marine scientist al 5to da despus del [place].  Se siente triste y deprimida.  El nio est demasiado somnoliento como para comer.  El nio tiene problemas para Industrial/product designer.   Moja menos de 3 paales en 24 horas.  Mueve el intestino menos de 3 veces en 24 horas.  La piel del beb o la parte blanca de sus ojos est ms amarilla.   El beb no ha aumentado de [place] a los [address] de [place]. ASEGRESE DE QUE:   Comprende estas instrucciones.  Controlar su enfermedad.  Solicitar ayuda de inmediato si no mejora o si empeora. Document  Released: 08/04/2005 Document Revised: 04/28/2012 [facility] Patient Information 2014 [place], [place].

## 2013-01-19 NOTE — Progress Notes (Signed)
Doing well--cultures next week.

## 2013-01-26 ENCOUNTER — Ambulatory Visit (INDEPENDENT_AMBULATORY_CARE_PROVIDER_SITE_OTHER): Payer: Self-pay | Admitting: Obstetrics and Gynecology

## 2013-01-26 ENCOUNTER — Encounter: Payer: Self-pay | Admitting: Obstetrics and Gynecology

## 2013-01-26 VITALS — BP 106/74 | Wt 181.0 lb

## 2013-01-26 DIAGNOSIS — Z3480 Encounter for supervision of other normal pregnancy, unspecified trimester: Secondary | ICD-10-CM

## 2013-01-26 DIAGNOSIS — Z3483 Encounter for supervision of other normal pregnancy, third trimester: Secondary | ICD-10-CM

## 2013-01-26 DIAGNOSIS — Z789 Other specified health status: Secondary | ICD-10-CM

## 2013-01-26 DIAGNOSIS — Z609 Problem related to social environment, unspecified: Secondary | ICD-10-CM

## 2013-01-26 DIAGNOSIS — O09519 Supervision of elderly primigravida, unspecified trimester: Secondary | ICD-10-CM

## 2013-01-26 DIAGNOSIS — O09513 Supervision of elderly primigravida, third trimester: Secondary | ICD-10-CM

## 2013-01-26 LAB — OB RESULTS CONSOLE GBS: GBS: POSITIVE

## 2013-01-26 NOTE — Progress Notes (Signed)
Patient doing well without complaints. FM/labor precautions reviewed. Cultures collected

## 2013-01-27 LAB — GC/CHLAMYDIA PROBE AMP: CT Probe RNA: NEGATIVE

## 2013-01-30 LAB — CULTURE, BETA STREP (GROUP B ONLY)

## 2013-01-31 ENCOUNTER — Encounter: Payer: Self-pay | Admitting: Obstetrics and Gynecology

## 2013-02-01 ENCOUNTER — Ambulatory Visit (INDEPENDENT_AMBULATORY_CARE_PROVIDER_SITE_OTHER): Payer: Self-pay | Admitting: Obstetrics & Gynecology

## 2013-02-01 ENCOUNTER — Encounter: Payer: Self-pay | Admitting: Obstetrics & Gynecology

## 2013-02-01 VITALS — BP 114/75 | Wt 183.0 lb

## 2013-02-01 DIAGNOSIS — Z3483 Encounter for supervision of other normal pregnancy, third trimester: Secondary | ICD-10-CM

## 2013-02-01 DIAGNOSIS — Z3480 Encounter for supervision of other normal pregnancy, unspecified trimester: Secondary | ICD-10-CM

## 2013-02-01 NOTE — Progress Notes (Signed)
Routine visit. Good FM. Labor precautions/FM reviewed.

## 2013-02-09 ENCOUNTER — Ambulatory Visit (INDEPENDENT_AMBULATORY_CARE_PROVIDER_SITE_OTHER): Payer: Self-pay | Admitting: Family Medicine

## 2013-02-09 VITALS — BP 100/62 | Wt 184.0 lb

## 2013-02-09 DIAGNOSIS — O09899 Supervision of other high risk pregnancies, unspecified trimester: Secondary | ICD-10-CM

## 2013-02-09 DIAGNOSIS — Z3483 Encounter for supervision of other normal pregnancy, third trimester: Secondary | ICD-10-CM

## 2013-02-09 DIAGNOSIS — Z3480 Encounter for supervision of other normal pregnancy, unspecified trimester: Secondary | ICD-10-CM

## 2013-02-09 DIAGNOSIS — Z22330 Carrier of Group B streptococcus: Secondary | ICD-10-CM

## 2013-02-09 DIAGNOSIS — O99820 Streptococcus B carrier state complicating pregnancy: Secondary | ICD-10-CM | POA: Insufficient documentation

## 2013-02-09 NOTE — Progress Notes (Signed)
Doing well--labor precautions. Membranes stripped minimally--difficult to reach cervix.

## 2013-02-09 NOTE — Patient Instructions (Addendum)
Embarazo  Systems analyst trimestre  (Pregnancy - Third Trimester) El tercer trimestre del Psychiatrist (los ltimos 3 meses) es el perodo en el cual tanto usted como su beb crecen con ms rapidez. El beb alcanza un largo de aproximadamente 50 cm. y pesa entre 2,700 y 4,500 kg. El beb gana ms tejido graso y est listo para la vida fuera del cuerpo de la [place]. Mientras estn en el interior, los bebs tienen perodos de sueo y vigilia, Warehouse manager y tienen hipo. Quizs sienta pequeas contracciones del tero. Este es el falso trabajo de [place]. Tambin se las conoce como contracciones de Braxton-H[place] . Es como una prctica del parto. Los problemas ms habituales de esta etapa del embarazo incluyen mayor dificultad para respirar, hinchazn de las manos y los pies por retencin de lquidos y la necesidad de Geographical information systems officer con ms frecuencia debido a que el tero y el beb presionan sobre la vejiga.  EXAMENES PRENATALES   Durante los Manpower Inc, deber seguir realizndose anlisis de [place]. Estas pruebas se realizan para controlar su salud y la del beb. Los ARAMARK Corporation de sangre se Radiographer, therapeutic para [employer] Mutual niveles de algunos compuestos de la sangre (hemoglobina). La anemia (bajo nivel de hemoglobina) es frecuente durante el embarazo. Para prevenirla, se administran hierro y vitaminas. Tambin le tomarn nuevas anlisis para descartar diabetes. Podrn repetirle algunas de las Hovnanian Enterprises hicieron previamente.  En cada visita le medirn el tamao del tero. Esto permite asegurar que el beb se desarrolla adecuadamente, segn la fecha del embarazo.  Le controlarn la presin arterial en cada visita prenatal. Esto es para asegurarse de que no sufre toxemia.  Le harn un anlisis de orina en cada visita prenatal, para descartar infecciones, diabetes y la presencia de protenas.  Tambin en cada visita controlarn su peso. Esto se realiza para asegurarse que aumenta de peso al ritmo indicado y que usted y  su beb evolucionan normalmente.  En algunas ocasiones se realiza una prueba de ultrasonido para confirmar el correcto desarrollo y evolucin del beb. Esta prueba se realiza con ondas sonoras inofensivas para el beb, de modo que el profesional pueda calcular ms precisamente la fecha del [place].  Analice con su mdico los analgsicos y la anestesia que recibir durante el Moore de parto y [place].  Comente la posibilidad de que necesite una cesrea y qu anestesia se recibir.  Informe a su mdico si sufre violencia familiar mental o fsica. A veces, se indica la prueba especializada sin estrs, la prueba de tolerancia a las contracciones y el perfil biofsico para asegurarse de que el beb no tiene problemas. El estudio del lquido amnitico que rodea al beb se llama amniocentesis. El lquido amnitico se obtiene introduciendo una aguja en el vientre (abdomen ). En ocasiones se lleva a cabo cerca del final del embarazo, si es necesario inducir a un parto. En este caso se realiza para asegurarse que los pulmones del beb estn lo suficientemente maduros como para que pueda vivir fuera del tero. Si los pulmones no han madurado y es peligroso que el beb nazca, se Building services engineer a la madre una inyeccin de [place] , 1 a 2 [address]  [address] antes del [address]. Vivia Budge ayuda a que los pulmones del beb maduren y sea ms seguro su nacimiento.  CAMBIOS QUE OCURREN EN EL TERCER TRIMESTRE DEL EMBARAZO  Su organismo atravesar numerosos cambios durante el [place]. Estos pueden variar de Neomia Dear persona a otra. Converse con el profesional que la asiste acerca los cambios que  usted note y que la preocupen.   Durante el ltimo trimestre probablemente sienta un aumento del apetito. Es normal tener "antojos" de Development worker, community. Esto vara de Neomia Dear persona a otra y de un embarazo a Therapist, art.  Podrn aparecer las primeras estras en las caderas, abdomen y [place]. Estos son cambios normales del cuerpo durante el [place]. No existen  medicamentos ni ejercicios que puedan prevenir CarMax.  La constipacin puede tratarse con un laxante o agregando fibra a su dieta. Beber grandes cantidades de lquidos, tomar fibras en forma de vegetales, frutas y granos integrales es de gran [place].  Tambin es beneficioso practicar actividad fsica. Si ha sido una persona Engineer, mining, podr continuar con la Harley-Davidson de las actividades durante el mismo. Si ha sido [employer] Insurance, puede ser beneficioso que comience con un programa de ejercicios, Museum/gallery exhibitions officer. Consulte con el profesional que la asiste antes de comenzar un programa de ejercicios.  Evite el consumo de cigarrillos, el alcohol, los medicamentos no recetados y las "drogas de la calle" durante el Psychiatrist. Estas sustancias qumicas afectan la formacin y el desarrollo del beb. Evite estas sustancias durante todo el embarazo para asegurar el nacimiento de un beb sano.  Podr sentir dolor de espalda, tener vrices en las venas y hemorroides, o si ya los sufra, pueden [place].  Durante el tercer trimestre se cansar con ms facilidad, lo cual es normal.  Los movimientos del beb pueden ser ms fuertes y con ms frecuencia.  Puede que note dificultades para respirar normalmente.  El ombligo puede salir hacia afuera.  A veces sale Veterinary surgeon de las [place], que se llama Product manager.  Podr aparecer Neomia Dear secrecin mucosa con sangre. Esto suele ocurrir General Electric unos [address] y Neomia Dear semana antes del [place]. INSTRUCCIONES PARA EL CUIDADO EN EL HOGAR   Cumpla con las citas de control. Siga las indicaciones del mdico con respecto al uso de [place], los ejercicios y la dieta.  Durante el embarazo debe obtener nutrientes para usted y para su beb. Consuma alimentos balanceados a intervalos regulares. Elija alimentos como carne, pescado, [place] y otros productos lcteos descremados, vegetales, frutas, panes integrales y cereales. El Office Depot informar  cul es el aumento de peso ideal.  Las relaciones sexuales pueden continuarse hasta casi el final del embarazo, si no se presentan otros problemas como prdida prematura (antes de [place]) de lquido amnitico, hemorragia vaginal o dolor en el vientre (abdominal).  Realice Tesoro Corporation, si no tiene restricciones. Consulte con el profesional que la asiste si no sabe con certeza si determinados ejercicios son seguros. El mayor aumento de peso se producir en los ltimos 2 trimestres del Psychiatrist. El ejercicio ayuda a:  Engineering geologist.  Mantenerse en forma para el trabajo de parto y [place] .  Perder peso despus del parto.  Haga reposo con frecuencia, con las piernas elevadas, o segn lo necesite para evitar los calambres y el dolor de cintura.  Use un buen sostn o como los que se usan para hacer deportes para Paramedic la sensibilidad de las [place]. Tambin puede serle til si lo [place] mientras duerme. Si pierde Product manager, podr Parker Hannifin.  No utilice la baera con agua caliente, baos turcos y saunas.   Colquese el cinturn de seguridad cuando conduzca. Este la proteger a usted y al beb en caso de accidente.  Evite comer carne cruda y el contacto con los utensilios y desperdicios de los gatos. Estos  elementos contienen grmenes que pueden causar defectos de nacimiento en el beb.  Es fcil perder algo de orina durante el [place]. Apretar y Chief Operating Officer los msculos de la pelvis la ayudar con este problema. Practique detener la miccin cuando est en el bao. Estos son los mismos msculos que Development worker, international aid. Son TEPP[employer] Partners mismos msculos que utiliza cuando trata de evitar despedir gases. Puede practicar apretando estos msculos WellPoint, y repetir esto tres veces por da aproximadamente. Una vez que conozca qu msculos debe apretar, no realice estos ejercicios durante la miccin. Puede favorecerle una infeccin si la orina vuelve hacia  atrs.  Pida ayuda si tienen necesidades financieras, teraputicas o nutricionales. El profesional podr ayudarla con respecto a estas necesidades, o derivarla a otros especialistas.  Haga una lista de nmeros telefnicos de emergencia y tngalos disponibles.  Planifique como obtener ayuda de familiares o amigos cuando regrese a Programmer, applications hospital.  Hacer un ensayo sobre la partida al hospital.  Chancellor clases prenatales con el padre para entender, practicar y hacer preguntas sobre el [place] de parto y el alumbramiento.  Preparar la habitacin del beb / busque Fatima Blank.  No viaje fuera de la ciudad a menos que sea absolutamente necesario y con el asesoramiento de su mdico.  Use slo zapatos de tacn bajo o sin tacn para tener mejor equilibrio y Automotive engineer cadas. USO DE MEDICAMENTOS Y CONSUMO DE DROGAS DURANTE EL [facility]   Tome las vitaminas apropiadas para esta etapa tal como se le indic. Las vitaminas deben contener un miligramo de cido flico. Guarde todas las vitaminas fuera del alcance de los nios. La ingestin de slo un par de vitaminas o tabletas que contengan hierro pueden ocasionar la Newmont Mining en un beb o en un nio pequeo.  Evite el uso de The Mutual of Omaha, incluyendo hierbas, medicamentos de [place], sin receta o que no hayan sido sugeridos por su mdico. Slo tome medicamentos de venta libre o medicamentos recetados para Chief Technology Officer, Environmental health practitioner o fiebre como lo indique su mdico. No tome aspirina, ibuprofeno o naproxeno excepto que su mdico se lo indique.  Infrmele al profesional si consume alguna droga.  El alcohol se relaciona con ciertos defectos congnitos. Incluye el sndrome de alcoholismo fetal. Debe evitar absolutamente el consumo de alcohol, en cualquier forma. El fumar produce baja tasa de natalidad y bebs prematuros.  Las drogas ilegales o de la calle son muy perjudiciales para el beb. Estn absolutamente prohibidas. Un beb que nace de Progress Energy, ser adicto al nacer. Ese beb tendr los mismos sntomas de abstinencia que un adulto. SOLICITE ATENCIN MDICA SI:  Tiene preguntas o preocupaciones relacionadas con el embarazo. Es mejor que llame para formular las preguntas si no puede esperar hasta la prxima visita, que sentirse preocupada por ellas.  SOLICITE ATENCIN MDICA DE INMEDIATO SI:   La temperatura oral le sube a ms de 38,9 C (102 F) o lo que su mdico le indique.  Tiene una prdida de lquido por la vagina (canal de parto). Si sospecha una ruptura de las [place], tmese la temperatura y llame al profesional para informarlo sobre esto.  Observa unas pequeas manchas, una hemorragia vaginal o elimina cogulos. Notifique al profesional acerca de la cantidad y de cuntos apsitos est utilizando.  Presenta un olor desagradable en la secrecin vaginal y observa un cambio en el color, de transparente a blanco.  Ha vomitado durante ms de 24 horas.  Siente escalofros o le sube la fiebre.  Conley Rolls  falta el aire.  Siente ardor al Beatrix Shipper.  Baja o sube ms de 2 libras (900 g), o segn lo indicado por el profesional que la asiste.  Observa que sbitamente se le hinchan el rostro, las manos, los pies o las piernas.  Siente dolor en el vientre (abdominal). Las Federal[employer]Mogul en el ligamento redondo son Neomia Dear causa benigna frecuente de dolor abdominal durante el embarazo. El profesional que la asiste deber evaluarla.  Presenta dolor de cabeza intenso que no se [place].  Tiene problemas visuales, visin doble o borrosa.  Si no siente los movimientos del beb durante ms de 1 hora. Si piensa que el beb no se mueve tanto como lo haca habitualmente, coma algo que Psychologist, clinical y Target Corporation lado izquierdo durante [place]. El beb debe moverse al menos 4  5 veces por hora. Comunquese inmediatamente si el beb se mueve menos que lo indicado.  Se cae, se ve involucrada en un accidente automovilstico o sufre algn  tipo de traumatismo.  En su hogar hay violencia mental o fsica. Document Released: 05/14/2005 Document Revised: 04/28/2012 [facility] Patient Information 2014 [place], [place].  Lactancia materna  (Breastfeeding)  El cambio hormonal durante el Psychiatrist produce el desarrollo del tejido Fries y un aumento en el nmero y tamao de los conductos galactforos. La hormona prolactina permite que las protenas, los azcares y las grasas de la sangre produzcan la WPS Resources materna en las glndulas productoras de [place]. La hormona progesterona impide que la leche materna sea liberada antes del nacimiento del beb. Despus del nacimiento del beb, su nivel de progesterona disminuye permitiendo que la leche materna sea [place]. Pensar en el beb, as como la succin o Theatre manager, pueden estimular la liberacin de [place] de las glndulas productoras de [place].  La decisin de Company secretary) es una de las mejores opciones que usted puede hacer para usted y su beb. La informacin que sigue da una breve resea de los beneficios, as Lexicographer que debe saber sobre la [place].  LOS BENEFICIOS DE AMAMANTAR  Para el beb   La primera leche (calostro) ayuda al mejor funcionamiento del sistema digestivo del beb.   La leche tiene anticuerpos que provienen de la madre y que ayudan a prevenir las infecciones en el beb.   El beb tiene una menor incidencia de asma, alergias y del sndrome de muerte sbita del lactante (SMSL).   Los nutrientes de la [place] materna son mejores para el beb que la [place].  La leche materna mejora el desarrollo cerebral del beb.   Su beb tendr menos gases, clicos y estreimiento.  Es menos probable que el beb desarrolle otras enfermedades, como obesidad infantil, asma o diabetes mellitus. Para usted   La lactancia materna favorece el desarrollo de un vnculo muy especial entre la madre y el beb.   Es ms conveniente,  siempre disponible, a la [place] y [place].   La lactancia materna ayuda a quemar caloras y a perder el peso ganado durante el [place].   Hace que el tero se contraiga ms rpidamente a su tamao normal y Consolidated Edison sangrado despus del [place].   Las [employer] Holdings que amamantan tienen menos riesgo de Environmental education officer osteoporosis o cncer de mama o de ovario en el futuro.  FRECUENCIA DEL AMAMANTAMIENTO   Un beb sano, nacido a trmino, puede amamantarse con tanta frecuencia como cada hora, o espaciar las comidas cada tres horas. La frecuencia en la lactancia varan de un beb a  otro.   Los recin nacidos deben ser alimentados por lo menos cada 2-3 horas Administrator y cada 4-5 horas durante la noche. Usted debe amamantarlo un mnimo de 8 tomas en un perodo de 24 horas.  Despierte al beb para amamantarlo si han pasado 3-4 horas desde la ltima comida.  Amamante cuando sienta la necesidad de reducir la plenitud de sus senos o cuando el beb muestre signos de [place]. Las seales de que el beb puede Gentry Fitz son:  Lenora Boys su estado de alerta o vigilancia.  Se estira.  Mueve la cabeza de un lado a otro.  Mueve la cabeza y abre la boca cuando se le toca la mejilla o la boca (reflejo de succin).  Aumenta las vocalizaciones, tales como sonidos de succin, relamerse los labios, arrullos, suspiros, o chirridos.  Mueve la [employer] boca.  Se chupa con ganas los dedos o las manos.  Agitacin.  Llanto intermitente.  Los signos de hambre extrema requerirn que lo calme y lo consuele antes de tratar de alimentarlo. Los signos de hambre extrema son:  Agitacin.  Llanto fuerte e intenso.  Gritos.  El amamantamiento frecuente la ayudar a producir ms [place] y a Education officer, community de Engineer, mining en los pezones e hinchazn de las [place].  LACTANCIA MATERNA   Ya sea que se encuentre acostada o sentada, asegrese que el abdomen del beb est enfrente el suyo.   Sostenga  la mama con el pulgar por arriba y los otros 4 dedos por debajo del pezn. Asegrese que sus dedos se encuentren lejos del pezn y de la boca del beb.   Empuje suavemente los labios del beb con el pezn o con el dedo.   Cuando la boca del beb se abra lo suficiente, introduzca el pezn y la zona oscura que lo rodea (areola) tanto como le sea posible dentro de la boca.  Debe haber ms areola visible por arriba del labio superior que por debajo del labio inferior.  La lengua del beb debe estar entre la enca inferior y el seno.  Asegrese de que la boca del beb est en la posicin correcta alrededor del pezn (prendida). Los labios del beb deben crear un sello sobre su pecho.  Las seales de que el beb se ha prendido eficazmente al pezn son:  Payton Doughty o succiona sin dolor.  Se escucha que traga Lyondell Chemical.  No hace ruidos ni chasquidos.  Hay movimientos musculares por arriba y por delante de sus odos al Printmaker.  El beb debe succionar unos 2-3 minutos para que salga la [place]. Permita que el nio se alimente en cada mama todo lo que desee. Alimente al beb hasta que se desprenda o se quede dormido en Freight forwarder y luego ofrzcale el segundo pecho.  Las seales de que el beb est lleno y satisfecho son:  Disminuye gradualmente el nmero de succiones o no succiona.  Se queda dormido.  Extiende o relaja su cuerpo.  Retiene una pequea cantidad de [facility] boca.  Se desprende del pecho por s mismo.  Los signos de una lactancia materna eficaz son:  Los senos han aumentado la firmeza, el peso y el tamao antes de la alimentacin.  Son ms blandos despus de amamantar.  Un aumento del volumen de [place], y tambin el cambio de su consistencia y color se producen hacia el quinto da de Tour manager.  La congestin mamaria se [place] al dar de [place].  Los pezones no duelen,  ni estn agrietados ni sangran.  De ser necesario, interrumpa la succin  poniendo su dedo en la esquina de la boca del beb y deslizando el dedo entre sus encas. A continuacin, retire la mama de su boca.  Es comn que los bebs regurgiten un poco despus de comer.  A menudo los bebs tragan aire al alimentarse. Esto puede hacer que se sienta molesto. Hacer eructar al beb al Pilar Plate de pecho puede ser de [place].  Se recomiendan suplementos de vitamina D para los bebs que reciben slo [address]  Evite el uso del chupete durante las primeras 4 a 6 semanas de vida.  Evite la alimentacin suplementaria con agua, frmula o jugo en lugar de la Colgate Palmolive. La leche materna es todo el alimento que el beb necesita. No es necesario que el nio ingiera agua o preparados de bibern. Sus pechos producirn ms leche si se evita la alimentacin suplementaria durante las primeras semanas. COMO SABER SI EL BEB OBTIENE LA SUFICIENTE LECHE MATERNA  Preguntarse si el beb obtiene la cantidad suficiente de [place] es una preocupacin frecuente Lucent Technologies. Puede asegurarse que el beb tiene la leche suficiente si:   El beb succiona activamente y usted escucha que traga.   El beb parece estar relajado y satisfecho despus de Psychologist, clinical.   El nio se alimenta al menos 8 a 12 veces en 24 horas.  Durante los primeros 3 a 5 das de vida:  Moja 3-5 paales en 24 horas. La materia fecal debe ser blanda y [place].  Tiene al menos 3 a 4 deposiciones en 24 horas. La materia fecal debe ser blanda y [place].  A los 5-7 das de vida, el beb debe tener al menos 3-6 deposiciones en 24 horas. La materia fecal debe ser grumosa y [place] a los 5 [address]  [address] de [place].  Su beb tiene una prdida de Psychologist, counselling a 7al 10% durante los primeros 3 [address]  [address] de [address]  El beb no pierde peso despus de 3-7 [address] de [address]  El beb debe aumentar 4 a 6 libras (120 a 170 gr.) por semana despus de los 4 [address] de vida.  Aumenta de [place] a los [address] de vida y vuelve al peso del nacimiento dentro de  las 2 semanas. CONGESTIN MAMARIA  Durante la primera semana despus del [place], usted puede experimentar hinchazn en las mamas (congestin [place]). Al estar congestionadas, las mamas se sienten pesadas, calientes o sensibles al tacto. El pico de la congestin ocurre a las 24 -48 horas despus del parto.   La congestin puede disminuirse:  Continuando con la Tour manager.  Aumentando la frecuencia.  Tomando duchas calientes o aplicando calor hmedo en los senos antes de cada comida. Esto aumenta la circulacin y [place] a que la [place].   Masajeando suavemente el pecho antes y  [employer]. Con las yemas de los dedos, masajee la pared del pecho hacia el pezn en un movimiento circular.   Asegurarse de que el beb vaca al menos uno de sus pechos en cada alimentacin. Tambin ayuda si comienza la siguiente toma en el otro seno.   Extraiga manualmente o con un sacaleches las mamas para vaciar los pechos si el beb tiene sueo o no se aliment bien. Tambin puede extraer la WPS Resources cuando vuelva a trabajar o si siente que se estn congestionando las [place].  Asegrese de que el beb se prende y est bien colocado durante la Market researcher. Si sigue estas indicaciones, la congestin Geologist, engineering  en 24 a 48 horas. Si an tiene dificultades, consulte a Barista.  CUDESE USTED MISMA  Cuide sus mamas.   Bese o dchese diariamente.   Evite usar Eaton Corporation.   Use un sostn de soporte Evite el uso de sostenes con aro.  Seque al aire sus pezones durante 3-4 minutos despus de cada comida.   Utilice slo apsitos de algodn en el sostn para absorber las prdidas de [place]. La prdida de un poco de Deere & Company las comidas es normal.   Use solamente lanolina pura en sus pezones despus de Museum/gallery exhibitions officer. Usted no tiene que lavarla antes de alimentar al beb. Otra opcin es sacarse unas gotas de [place] y Pepco Holdings pezones.  Continuar con  los autocontroles de la mama. Cudese.   Consuma alimentos saludables. Alterne 3 comidas con 3 colaciones.  Evite los alimentos que usted nota que perjudican al beb.  Dixie Dials, jugos de fruta y agua para Patent examiner su sed (aproximadamente 8 vasos al Futures trader).   Descanse con frecuencia, reljese y tome sus vitaminas prenatales para evitar la fatiga, el estrs y la anemia.  Evite masticar y fumar tabaco.  Evite el consumo de alcohol y drogas.  Tome medicamentos de venta libre y recetados tal como le indic su mdico o Social research officer, government. Siempre debe consultar con su mdico o farmacutico antes de tomar cualquier medicamento, vitamina o suplemento de hierbas.  Sepa que durante la lactancia puede quedar embarazada. Si lo desea, hable con su mdico acerca de la planificacin familiar y los mtodos anticonceptivos seguros que puede utilizar durante la Market researcher. SOLICITE ATENCIN MDICA SI:   Usted siente que quiere dejar de Museum/gallery exhibitions officer o se siente frustrada con la lactancia.  Siente dolor en los senos o en los pezones.  Sus pezones estn agrietados o Water quality scientist.  Sus pechos estn irritados, sensibles o calientes.  Tiene un rea hinchada en cualquiera de los senos.  Siente escalofros o fiebre.  Tiene nuseas o vmitos.  Observa un drenaje en los pezones.  Sus mamas no se llenan antes de Marine scientist al 5to da despus del [place].  Se siente triste y deprimida.  El nio est demasiado somnoliento como para comer.  El nio tiene problemas para Industrial/product designer.   Moja menos de 3 paales en 24 horas.  Mueve el intestino menos de 3 veces en 24 horas.  La piel del beb o la parte blanca de sus ojos est ms amarilla.   El beb no ha aumentado de [place] a los [address] de [place]. ASEGRESE DE QUE:   Comprende estas instrucciones.  Controlar su enfermedad.  Solicitar ayuda de inmediato si no mejora o si empeora. Document Released: 08/04/2005 Document Revised: 04/28/2012 [facility] Patient  Information 2014 [place], [place]. Parto vaginal  (Vaginal Delivery) En Restaurant manager, fast food, su mdico debe estar seguro de que usted est en trabajo de parto. Algunos signos son:  Puede haber eliminado el "tapn mucoso" antes que comience el trabajo de [place]. Se trata de una pequea cantidad de mucus con sangre.  Tiene contracciones uterinas regulares.  El Bank [employer] America las contracciones se acorta.  Las molestias y Chief Technology Officer se hacen gradualmente ms intensos.  El dolor se ubica principalmente en la espalda.  Los dolores empeoran al [employer].  El cuello del tero (la apertura del tero) se hace ms delgada, comienza a borrarse, y se abre (se dilata). Una vez que se encuentre en [place] parto y sea admitida en el hospital, el mdico har lo  siguiente:  Un examen fsico completo.  Controlar sus signos vitales (presin arterial, pulso, temperatura y la frecuencia cardaca fetal).  Realizar un examen vaginal (usando un guante estril y lubricante) para determinar:  La posicin (presentacin) del beb (ceflica [vertex] o nalgas primero).  El nivel (plano) de la cabeza del beb en el canal de parto.  El borramiento y dilatacin del cuello del tero.  Le rasurarn el vello pbico y le aplicarn una enema segn lo considere el mdico y las circunstancias.  Generalmente se coloca un monitor electrnico sobre el abdomen. El monitor sigue la duracin e intensidad de las contracciones, as como la frecuencia cardaca del beb.  Generalmente, el profesional inserta una va intravenosa en el brazo para administrarle agua azucarada. Esta es una medida de precaucin, de modo que puedan administrarle rpidamente medicamentos durante el [place]. EL TRABAJO DE PARTO Y PARTO NORMALES SE DIVIDEN EN 3 ETAPAS: Primera etapa Comienzan las contracciones regulares y el cuello comienza a borrarse y dilatarse. Esta etapa puede durar entre 3 y 15 horas. El final de la primera etapa se considera cuando  el cuello est borrado en un 100% y se ha dilatado 10 cm. Le administrarn analgsicos por:  Inyeccin (morfina, demerol, etc.).  Anestesia regional (espinal, caudal o epidural, anestsicos colocados en diferentes regiones de la columna vertebral). Podrn administrarle medicamentos para el dolor en la regin paracervical, que consiste en la aplicacin de un anestsico inyectable en cada uno de los lados del cuello del tero. La embarazada puede requerir un "parto natural", es decir no recibir medicamentos o anestesia durante el [place] de [place]. Segunda etapa En este momento el beb baja a travs del canal de parto (vagina) y nace. Esto puede durar entre 1 y 4 horas. A medida que el beb asoma la cabeza por el canal de parto, podr sentir una sensacin similar a cuando mueve el intestino. Sentir el impulse de empujar con fuerza hasta que el nio salga. A medida que la cabecita baja, el mdico decidir si realiza una episiotoma (corte en el perineo y rea de la vagina) para evitar la ruptura de los tejidos. Luego del nacimiento del beb y la expulsin de la placenta, la episiotoma se sutura. En algunos casos se coloca a la madre una mscara con xido nitroso para Research officer, political party respiracin y Engineer, materials. El final de la etapa 2 se produce cuando el beb ha salido completamente. Luego, cuando el cordn umbilical deja de pulsar, se pinza y se corta. Tercera etapa La tercera etapa comienza luego que el beb ha nacido y finaliza luego de la expulsin de la placenta. Generalmente esto lleva entre 5 y 30 minutos. Luego de la expulsin de la placenta, le aplicarn un medicamento por va intravenosa para ayudar a Engineer, materials y Psychiatric nurse. En la tercera etapa no hay dolor y generalmente no son necesarios los analgsicos. Si le han realizado una episiotoma, es el momento de Sales promotion account executive. Luego del parto, la mam es observada y controlada exhaustivamente durante 1  2 horas para verificar  que no hay sangrado en el post parto (hemorragias). Si pierde [employer], le administrarn un medicamento para Engineer, manufacturing tero y Comptroller. Document Released: 07/17/2008 Document Revised: 04/28/2012 [facility] Patient Information 2014 [place], [place].

## 2013-02-09 NOTE — Assessment & Plan Note (Signed)
Doing well 

## 2013-02-09 NOTE — Progress Notes (Signed)
P-[id number] 

## 2013-02-13 ENCOUNTER — Inpatient Hospital Stay (HOSPITAL_COMMUNITY): Payer: Medicaid Other | Admitting: Anesthesiology

## 2013-02-13 ENCOUNTER — Inpatient Hospital Stay (HOSPITAL_COMMUNITY)
Admission: AD | Admit: 2013-02-13 | Discharge: 2013-02-15 | DRG: 775 | Disposition: A | Discharge status: Home / Self Care | Payer: Medicaid Other | Source: Ambulatory Visit | Attending: Obstetrics & Gynecology | Admitting: Obstetrics & Gynecology

## 2013-02-13 ENCOUNTER — Encounter (HOSPITAL_COMMUNITY): Payer: Self-pay | Admitting: Anesthesiology

## 2013-02-13 ENCOUNTER — Encounter (HOSPITAL_COMMUNITY): Payer: Self-pay | Admitting: Obstetrics and Gynecology

## 2013-02-13 DIAGNOSIS — O4290 Premature rupture of membranes, unspecified as to length of time between rupture and onset of labor, unspecified weeks of gestation: Secondary | ICD-10-CM

## 2013-02-13 DIAGNOSIS — Z3483 Encounter for supervision of other normal pregnancy, third trimester: Secondary | ICD-10-CM

## 2013-02-13 DIAGNOSIS — Z22330 Carrier of Group B streptococcus: Secondary | ICD-10-CM

## 2013-02-13 DIAGNOSIS — O09513 Supervision of elderly primigravida, third trimester: Secondary | ICD-10-CM

## 2013-02-13 DIAGNOSIS — IMO0001 Reserved for inherently not codable concepts without codable children: Secondary | ICD-10-CM

## 2013-02-13 DIAGNOSIS — O09529 Supervision of elderly multigravida, unspecified trimester: Secondary | ICD-10-CM | POA: Diagnosis present

## 2013-02-13 DIAGNOSIS — O99820 Streptococcus B carrier state complicating pregnancy: Secondary | ICD-10-CM

## 2013-02-13 DIAGNOSIS — O99892 Other specified diseases and conditions complicating childbirth: Secondary | ICD-10-CM | POA: Diagnosis present

## 2013-02-13 LAB — CBC
Hemoglobin: 11.9 g/dL — ABNORMAL LOW (ref 12.0–15.0)
MCH: 30.3 pg (ref 26.0–34.0)
MCHC: 34.1 g/dL (ref 30.0–36.0)
MCV: 88.8 fL (ref 78.0–100.0)
RBC: 3.93 MIL/uL (ref 3.87–5.11)

## 2013-02-13 LAB — TYPE AND SCREEN: ABO/RH(D): B POS

## 2013-02-13 LAB — RPR: RPR Ser Ql: NONREACTIVE

## 2013-02-13 MED ORDER — ACETAMINOPHEN 325 MG PO TABS: 650.0000 mg | ORAL_TABLET | ORAL | Status: DC | PRN

## 2013-02-13 MED ORDER — OXYTOCIN BOLUS FROM INFUSION
500.0000 mL | INTRAVENOUS | Status: DC
  Administered 2013-02-13: 500 mL via INTRAVENOUS

## 2013-02-13 MED ORDER — MEASLES, MUMPS & RUBELLA VAC ~~LOC~~ INJ
0.5000 mL | INJECTION | Freq: Once | SUBCUTANEOUS | Status: DC
  Filled 2013-02-13: qty 0.5

## 2013-02-13 MED ORDER — ONDANSETRON HCL 4 MG/2ML IJ SOLN
4.0000 mg | Freq: Four times a day (QID) | INTRAMUSCULAR | Status: DC | PRN
  Administered 2013-02-13: 4 mg via INTRAVENOUS
  Filled 2013-02-13: qty 2

## 2013-02-13 MED ORDER — SIMETHICONE 80 MG PO CHEW: 80.0000 mg | CHEWABLE_TABLET | ORAL | Status: DC | PRN

## 2013-02-13 MED ORDER — ONDANSETRON HCL 4 MG PO TABS: 4.0000 mg | ORAL_TABLET | ORAL | Status: DC | PRN

## 2013-02-13 MED ORDER — LIDOCAINE HCL (PF) 1 % IJ SOLN
INTRAMUSCULAR | Status: DC | PRN
  Administered 2013-02-13 (×4): 4 mL

## 2013-02-13 MED ORDER — DIPHENHYDRAMINE HCL 25 MG PO CAPS: 25.0000 mg | ORAL_CAPSULE | Freq: Four times a day (QID) | ORAL | Status: DC | PRN

## 2013-02-13 MED ORDER — DIBUCAINE 1 % RE OINT: 1.0000 "application " | TOPICAL_OINTMENT | RECTAL | Status: DC | PRN

## 2013-02-13 MED ORDER — LIDOCAINE HCL (PF) 1 % IJ SOLN
30.0000 mL | INTRAMUSCULAR | Status: DC | PRN
  Filled 2013-02-13 (×2): qty 30

## 2013-02-13 MED ORDER — PHENYLEPHRINE 40 MCG/ML (10ML) SYRINGE FOR IV PUSH (FOR BLOOD PRESSURE SUPPORT)
80.0000 ug | PREFILLED_SYRINGE | INTRAVENOUS | Status: DC | PRN
  Filled 2013-02-13: qty 2

## 2013-02-13 MED ORDER — PRENATAL MULTIVITAMIN CH
1.0000 | ORAL_TABLET | Freq: Every day | ORAL | Status: DC
  Administered 2013-02-14: 1 via ORAL
  Filled 2013-02-13: qty 1

## 2013-02-13 MED ORDER — WITCH HAZEL-GLYCERIN EX PADS: 1.0000 "application " | MEDICATED_PAD | CUTANEOUS | Status: DC | PRN

## 2013-02-13 MED ORDER — ZOLPIDEM TARTRATE 5 MG PO TABS: 5.0000 mg | ORAL_TABLET | Freq: Every evening | ORAL | Status: DC | PRN

## 2013-02-13 MED ORDER — IBUPROFEN 600 MG PO TABS
600.0000 mg | ORAL_TABLET | Freq: Four times a day (QID) | ORAL | Status: DC
  Administered 2013-02-13 – 2013-02-15 (×6): 600 mg via ORAL
  Filled 2013-02-13 (×6): qty 1

## 2013-02-13 MED ORDER — PHENYLEPHRINE 40 MCG/ML (10ML) SYRINGE FOR IV PUSH (FOR BLOOD PRESSURE SUPPORT)
80.0000 ug | PREFILLED_SYRINGE | INTRAVENOUS | Status: DC | PRN
  Filled 2013-02-13: qty 2
  Filled 2013-02-13: qty 5

## 2013-02-13 MED ORDER — SENNOSIDES-DOCUSATE SODIUM 8.6-50 MG PO TABS
2.0000 | ORAL_TABLET | Freq: Every day | ORAL | Status: DC
  Administered 2013-02-14: 2 via ORAL

## 2013-02-13 MED ORDER — NALBUPHINE SYRINGE 5 MG/0.5 ML
5.0000 mg | INJECTION | INTRAMUSCULAR | Status: DC | PRN
  Filled 2013-02-13: qty 0.5

## 2013-02-13 MED ORDER — FLEET ENEMA 7-19 GM/118ML RE ENEM: 1.0000 | ENEMA | Freq: Every day | RECTAL | Status: DC | PRN

## 2013-02-13 MED ORDER — OXYCODONE-ACETAMINOPHEN 5-325 MG PO TABS
1.0000 | ORAL_TABLET | ORAL | Status: DC | PRN
  Administered 2013-02-14: 1 via ORAL
  Administered 2013-02-15: 2 via ORAL
  Filled 2013-02-13 (×3): qty 1

## 2013-02-13 MED ORDER — LACTATED RINGERS IV SOLN: 500.0000 mL | INTRAVENOUS | Status: DC | PRN

## 2013-02-13 MED ORDER — OXYTOCIN 40 UNITS IN LACTATED RINGERS INFUSION - SIMPLE MED
1.0000 m[IU]/min | INTRAVENOUS | Status: DC
  Administered 2013-02-13: 2 m[IU]/min via INTRAVENOUS
  Filled 2013-02-13: qty 1000

## 2013-02-13 MED ORDER — BISACODYL 10 MG RE SUPP: 10.0000 mg | Freq: Every day | RECTAL | Status: DC | PRN

## 2013-02-13 MED ORDER — FERROUS SULFATE 325 (65 FE) MG PO TABS
325.0000 mg | ORAL_TABLET | Freq: Two times a day (BID) | ORAL | Status: DC
  Administered 2013-02-14 – 2013-02-15 (×3): 325 mg via ORAL
  Filled 2013-02-13 (×3): qty 1

## 2013-02-13 MED ORDER — DIPHENHYDRAMINE HCL 50 MG/ML IJ SOLN
12.5000 mg | INTRAMUSCULAR | Status: DC | PRN
Start: 2013-02-13 — End: 2013-02-13

## 2013-02-13 MED ORDER — TERBUTALINE SULFATE 1 MG/ML IJ SOLN: 0.2500 mg | Freq: Once | INTRAMUSCULAR | Status: DC | PRN

## 2013-02-13 MED ORDER — OXYTOCIN 40 UNITS IN LACTATED RINGERS INFUSION - SIMPLE MED: 62.5000 mL/h | INTRAVENOUS | Status: DC | PRN

## 2013-02-13 MED ORDER — IBUPROFEN 600 MG PO TABS: 600.0000 mg | ORAL_TABLET | Freq: Four times a day (QID) | ORAL | Status: DC | PRN

## 2013-02-13 MED ORDER — EPHEDRINE 5 MG/ML INJ
10.0000 mg | INTRAVENOUS | Status: DC | PRN
  Filled 2013-02-13: qty 2
  Filled 2013-02-13: qty 4

## 2013-02-13 MED ORDER — ONDANSETRON HCL 4 MG/2ML IJ SOLN: 4.0000 mg | INTRAMUSCULAR | Status: DC | PRN

## 2013-02-13 MED ORDER — BENZOCAINE-MENTHOL 20-0.5 % EX AERO: 1.0000 "application " | INHALATION_SPRAY | CUTANEOUS | Status: DC | PRN

## 2013-02-13 MED ORDER — OXYCODONE-ACETAMINOPHEN 5-325 MG PO TABS: 1.0000 | ORAL_TABLET | ORAL | Status: DC | PRN

## 2013-02-13 MED ORDER — PENICILLIN G POTASSIUM 5000000 UNITS IJ SOLR
2.5000 10*6.[IU] | INTRAVENOUS | Status: DC
  Administered 2013-02-13 (×2): 2.5 10*6.[IU] via INTRAVENOUS
  Filled 2013-02-13 (×4): qty 2.5

## 2013-02-13 MED ORDER — LACTATED RINGERS IV SOLN
INTRAVENOUS | Status: DC
  Administered 2013-02-13 (×2): via INTRAVENOUS

## 2013-02-13 MED ORDER — EPHEDRINE 5 MG/ML INJ
10.0000 mg | INTRAVENOUS | Status: DC | PRN
  Filled 2013-02-13: qty 2

## 2013-02-13 MED ORDER — OXYTOCIN 40 UNITS IN LACTATED RINGERS INFUSION - SIMPLE MED: 62.5000 mL/h | INTRAVENOUS | Status: DC

## 2013-02-13 MED ORDER — LACTATED RINGERS IV SOLN
500.0000 mL | Freq: Once | INTRAVENOUS | Status: AC
  Administered 2013-02-13: 19:00:00 via INTRAVENOUS

## 2013-02-13 MED ORDER — LANOLIN HYDROUS EX OINT: TOPICAL_OINTMENT | CUTANEOUS | Status: DC | PRN

## 2013-02-13 MED ORDER — FENTANYL 2.5 MCG/ML BUPIVACAINE 1/10 % EPIDURAL INFUSION (WH - ANES)
14.0000 mL/h | INTRAMUSCULAR | Status: DC | PRN
  Administered 2013-02-13: 14 mL/h via EPIDURAL
  Filled 2013-02-13: qty 125

## 2013-02-13 MED ORDER — PENICILLIN G POTASSIUM 5000000 UNITS IJ SOLR
5.0000 10*6.[IU] | Freq: Once | INTRAVENOUS | Status: AC
  Administered 2013-02-13: 5 10*6.[IU] via INTRAVENOUS
  Filled 2013-02-13: qty 5

## 2013-02-13 MED ORDER — TETANUS-DIPHTH-ACELL PERTUSSIS 5-2.5-18.5 LF-MCG/0.5 IM SUSP: 0.5000 mL | Freq: Once | INTRAMUSCULAR | Status: DC

## 2013-02-13 MED ORDER — CITRIC ACID-SODIUM CITRATE 334-500 MG/5ML PO SOLN: 30.0000 mL | ORAL | Status: DC | PRN

## 2013-02-13 NOTE — Progress Notes (Signed)
Barbara Coffey is a 37 y.o. [id number] at [redacted]w[redacted]d by LMP admitted for rupture of membranes  Subjective: Now mildly uncomfortable with contractions.  Objective: BP 115/74  Pulse 79  Temp(Src) 98.1 F (36.7 C) (Oral)  Resp 18  Ht 5\' 2"  (1.575 m)  Wt 83.915 kg (185 lb)  BMI 33.83 kg/m2  LMP 05/13/2012      FHT:  FHR: 135 bpm, variability: moderate,  accelerations:  Present,  decelerations:  Absent UC:   Regular every 2-3 mins SVE:   Dilation: 1.5 Effacement (%): 50 Station: Ballotable Exam by:: [employer]: Lab Results  Component Value Date   WBC 5.8 02/13/2013   HGB 11.9* 02/13/2013   HCT 34.9* 02/13/2013   MCV 88.8 02/13/2013   PLT 177 02/13/2013    Assessment / Plan: SROM. Currently on Pitocin 10 mU; no cervical change.  Labor: Augmenting with pitocin.  Fetal Wellbeing:  Category I Pain Control:  Labor support without medications I/D:  PCN for + GBS Anticipated MOD:  NSVD  Barbara Coffey Other 02/13/2013, 5:06 PM

## 2013-02-13 NOTE — MAU Note (Signed)
Pt presents with complaints of rupture of membranes at 830am clear fluid.

## 2013-02-13 NOTE — H&P (Signed)
Barbara Coffey is a 37 y.o. [phone] at 105w3d who presents with SROM. Patient reports that her water broke at ~8:20 am.  She has been having some contractions but states that they are mild.  No vaginal bleeding. No other complaints currently.  Patient receives her [facility].  Patient is AMA.  Patient decline genetic screening, [place] normal, 1 hour GTT normal at 124, GBS positive.   History OB History   Grav Para Term Preterm Abortions TAB SAB Ect Mult Living   6 3 3  2  2   3      Past Medical History  Diagnosis Date  . No pertinent past medical history    Past Surgical History  Procedure Laterality Date  . Left ovarian cyst     Family History: family history includes Hypertension in her father. Social History:  reports that she has never smoked. She does not have any smokeless tobacco history on file. She reports that she does not drink alcohol or use illicit drugs.   Prenatal Transfer Tool  Maternal Diabetes: No Genetic Screening: Declined Maternal Ultrasounds/Referrals: Normal Fetal Ultrasounds or other Referrals:  None Maternal Substance Abuse:  No Significant Maternal Medications:  None Significant Maternal Lab Results:  Lab values include: Group B Strep positive  ROS Per HPI  Blood pressure 130/82, pulse 92, temperature 97.8 F (36.6 C), temperature source Oral, resp. rate 18, last menstrual period 05/13/2012. Exam Physical Exam  Gen: well appearing, NAD. Heart: RRR Lungs: CTAB.  Abd: gravid but otherwise soft, nontender to palpation Ext: trace LE edema. Neuro: no focal deficits. Cervical Exam: Dilation: 1.5 Effacement (%): 50 Cervical Position: Middle Station: Ballotable;-2 Presentation: Vertex Exam by:: Victorino Dike Rasch RN   FHR: baseline 140, mod variability, 15x15 accels, no decels Toco: irritability with occasional UC  Prenatal labs: ABO, Rh: B/POS/-- (11/20 1614) Antibody: NEG (11/20 1614) Rubella: 326.6 (11/20 1614) RPR: NON REAC  (04/16 1407)  HBsAg: NEGATIVE (11/20 1614)  HIV: NON REACTIVE (04/16 1407)  GBS:   Positive  Assessment/Plan: Barbara Coffey is a 37 y.o. [phone] at [redacted]w[redacted]d who presents with SROM. Grossly ruptured in MAU. - Admit to L&D. - PCN for GBS prophylaxis. - Pain control: IV Nubain; May have epidural if requested - Anticipate NSVD. Patient may need augmentation if does not progress.  [name] Other 02/13/2013, 10:31 AM   I saw and examined patient and agree with above resident note. I reviewed history, imaging, labs, and vitals. I personally reviewed the fetal heart tracing, and it is reactive. Napoleon Form, MD

## 2013-02-13 NOTE — Anesthesia Procedure Notes (Signed)
Epidural Patient location during procedure: OB Start time: 02/13/2013 6:54 PM  Staffing Performed by: anesthesiologist   Preanesthetic Checklist Completed: patient identified, site marked, surgical consent, pre-op evaluation, timeout performed, IV checked, risks and benefits discussed and monitors and equipment checked  Epidural Patient position: sitting Prep: site prepped and draped and DuraPrep Patient monitoring: continuous pulse ox and blood pressure Approach: midline Injection technique: LOR air  Needle:  Needle type: Tuohy  Needle gauge: 17 G Needle length: 9 cm and 9 Needle insertion depth: 5 cm cm Catheter type: closed end flexible Catheter size: 19 Gauge Catheter at skin depth: 10 cm Test dose: negative  Assessment Events: blood not aspirated, injection not painful, no injection resistance, negative IV test and no paresthesia  Additional Notes Discussed risk of headache, infection, bleeding, nerve injury and failed or incomplete block.  Patient voices understanding and wishes to proceed.  Epidural placed easily on first attempt.  No paresthesia.  Patient tolerated procedure well with no apparent complications.  [name], MDReason for block:procedure for pain

## 2013-02-13 NOTE — Progress Notes (Signed)
Barbara Coffey is a 37 y.o. [id number] at [redacted]w[redacted]d by LMP admitted for rupture of membranes  Subjective: No complaints. Comfortable.  Objective: BP 115/69  Pulse 77  Temp(Src) 97.8 F (36.6 C) (Oral)  Resp 18  Ht 5\' 2"  (1.575 m)  Wt 83.915 kg (185 lb)  BMI 33.83 kg/m2  LMP 05/13/2012      FHT:  FHR: 145 bpm, variability: moderate,  accelerations:  Present,  decelerations:  Absent UC:   Irritability noted SVE:   Dilation: 1.5 Effacement (%): 50 Station: Ballotable;-2 Exam by:: [employer] RN   Labs: Lab Results  Component Value Date   WBC 5.8 02/13/2013   HGB 11.9* 02/13/2013   HCT 34.9* 02/13/2013   MCV 88.8 02/13/2013   PLT 177 02/13/2013    Assessment / Plan: SROM, Uterine irritabilty.  Labor: Will augment with Pitocin Fetal Wellbeing:  Category I Pain Control:  Labor support without medications I/D:  PCN for + GBS Anticipated MOD:  NSVD  [name] Other 02/13/2013, 12:57 PM

## 2013-02-13 NOTE — Anesthesia Preprocedure Evaluation (Signed)

## 2013-02-14 NOTE — Progress Notes (Signed)
Ur chart review completed.  

## 2013-02-14 NOTE — Anesthesia Postprocedure Evaluation (Signed)
  Anesthesia Post-op Note  Patient: Barbara Coffey  Procedure(s) Performed: * No procedures listed *  Patient Location: Mother/Baby  Anesthesia Type:Epidural  Level of Consciousness: awake  Airway and Oxygen Therapy: Patient Spontanous Breathing  Post-op Pain: none  Post-op Assessment: Patient's Cardiovascular Status Stable and Respiratory Function Stable  Post-op Vital Signs: Reviewed and stable  Complications: No apparent anesthesia complications

## 2013-02-14 NOTE — Progress Notes (Signed)
Post Partum Day 1  Subjective: no complaints, up ad lib, voiding, tolerating PO and + flatus  Objective: Blood pressure 96/55, pulse 81, temperature 98.3 F (36.8 C), temperature source Oral, resp. rate 20, height 5\' 2"  (1.575 m), weight 83.915 kg (185 lb), last menstrual period 05/13/2012, SpO2 100.00%, unknown if currently breastfeeding.  Physical Exam:  General: alert, cooperative and no distress Lochia: appropriate Uterine Fundus: firm Incision: n/a DVT Evaluation: No evidence of DVT seen on physical exam. Negative Homan's sign. No cords or calf tenderness. No significant calf/ankle edema.   Recent Labs  02/13/13 1105  HGB 11.9*  HCT 34.9*    Assessment/Plan: Plan for discharge tomorrow, Breastfeeding and Contraception OCP or IUD   LOS: 1 day   Napoleon Form 02/14/2013, 7:59 AM

## 2013-02-14 NOTE — Progress Notes (Signed)
I assisted FP with questions 

## 2013-02-15 ENCOUNTER — Encounter: Payer: Self-pay | Admitting: Obstetrics and Gynecology

## 2013-02-15 MED ORDER — IBUPROFEN 600 MG PO TABS: 600.0000 mg | ORAL_TABLET | Freq: Four times a day (QID) | ORAL | Status: DC | PRN

## 2013-02-15 NOTE — Progress Notes (Signed)
Post Partum Day 2 Subjective: no complaints, pain well controlled. Bleeding is less than a period. Breastfeeding well. Considering IUD.  Objective: Blood pressure 113/65, pulse 77, temperature 97.9 F (36.6 C), temperature source Oral, resp. rate 20, height 5\' 2"  (1.575 m), weight 83.915 kg (185 lb), last menstrual period 05/13/2012, SpO2 98.00%, unknown if currently breastfeeding.  Physical Exam:  General: alert and no distress Lochia: appropriate Uterine Fundus: firm DVT Evaluation: No evidence of DVT seen on physical exam. No significant calf/ankle edema.   Recent Labs  02/13/13 1105  HGB 11.9*  HCT 34.9*    Assessment/Plan: Discharge home today. Considering IUD for birth control.   LOS: 2 days   Tawni Carnes 02/15/2013, 7:44 AM   Evaluation and management procedures were performed by Resident physician under my supervision/collaboration. Chart reviewed, patient examined by me and I agree with management and plan. Discharge with F/U [facility] [address] [place], [place] 02/15/2013 9:46 AM

## 2013-02-15 NOTE — Progress Notes (Signed)
I assisted FP with questions 

## 2013-02-15 NOTE — Discharge Summary (Signed)
Obstetric Discharge Summary Reason for Admission: onset of labor Prenatal Procedures: ultrasound Intrapartum Procedures: spontaneous vaginal delivery Postpartum Procedures: none Complications-Operative and Postpartum: none Hemoglobin  Date Value Range Status  02/13/2013 11.9* 12.0 - 15.0 g/dL Final     HCT  Date Value Range Status  02/13/2013 34.9* 36.0 - 46.0 % Final    Physical Exam:  General: alert, cooperative and no distress Lochia: appropriate Uterine Fundus: firm Incision: n/a DVT Evaluation: No evidence of DVT seen on physical exam. Filed Vitals:   02/15/13 0546  BP: 113/65  Pulse: 77  Temp: 97.9 F (36.6 C)  Resp: 20  Breastfeeding well. Afterpains moderate   Discharge Diagnoses: Term Pregnancy-delivered [id number] Discharge Information: Date: 02/15/2013 Activity: pelvic rest Diet: routine Medications: PNV and Ibuprofen Condition: stable Instructions: refer to practice specific booklet Discharge to: home Follow-up Information   Follow up with [facility]. Schedule an appointment as soon as possible for a visit in 4 weeks.   Contact information:   [address] [place] [address] [phone]    Appointment for  IUD  Newborn Data: Live born female  Birth Weight: 8 lb 2.9 oz (3710 g) APGAR: 9, 9  Home with mother.  Nikia Mangino 02/15/2013, 9:42 AM

## 2013-03-22 ENCOUNTER — Ambulatory Visit: Payer: Self-pay | Admitting: Family Medicine

## 2013-03-25 ENCOUNTER — Encounter: Payer: Self-pay | Admitting: Obstetrics & Gynecology

## 2013-03-25 ENCOUNTER — Ambulatory Visit (INDEPENDENT_AMBULATORY_CARE_PROVIDER_SITE_OTHER): Payer: Self-pay | Admitting: Obstetrics & Gynecology

## 2013-03-25 MED ORDER — NORETHINDRONE 0.35 MG PO TABS: 1.0000 | ORAL_TABLET | Freq: Every day | ORAL | Status: DC

## 2013-03-25 NOTE — Progress Notes (Signed)
  Subjective:    Patient ID: Barbara Coffey, female    DOB: May 23, 1976, 37 y.o.   MRN: [id number]  HPI  37 yo MW P4 who is here for her pp visit. She has no problems. She is breast and bottle feeding and the baby is doing well. She has not had sex yet but would like OCPs. She denies any pp depression. She reports normal bowel and bladder function.  Review of Systems Pap normal 11/13    Objective:   Physical Exam   Atrophic vulva and vagina Well-healed Involuted NSSA, NT, mobile uterus and normal adnexal exam     Assessment & Plan:  Pp-doing well Micronor. Use condoms for at least a month. RTC 1 year/prn sooner

## 2014-01-27 ENCOUNTER — Other Ambulatory Visit (HOSPITAL_COMMUNITY): Payer: Self-pay | Admitting: Geriatric Medicine

## 2014-02-14 ENCOUNTER — Other Ambulatory Visit (HOSPITAL_COMMUNITY): Payer: Self-pay | Admitting: Geriatric Medicine

## 2014-02-14 DIAGNOSIS — Z1231 Encounter for screening mammogram for malignant neoplasm of breast: Secondary | ICD-10-CM

## 2014-03-02 ENCOUNTER — Ambulatory Visit (HOSPITAL_COMMUNITY): Payer: Self-pay

## 2014-06-19 ENCOUNTER — Encounter: Payer: Self-pay | Admitting: Obstetrics & Gynecology

## 2016-03-12 ENCOUNTER — Other Ambulatory Visit: Payer: Self-pay | Admitting: *Deleted

## 2016-03-12 ENCOUNTER — Ambulatory Visit (INDEPENDENT_AMBULATORY_CARE_PROVIDER_SITE_OTHER): Payer: Self-pay | Admitting: Family Medicine

## 2016-03-12 ENCOUNTER — Telehealth: Payer: Self-pay | Admitting: *Deleted

## 2016-03-12 VITALS — BP 104/70 | HR 84 | Temp 98.0°F | Resp 16 | Ht 61.5 in | Wt 173.0 lb

## 2016-03-12 DIAGNOSIS — Z36 Encounter for antenatal screening of mother: Secondary | ICD-10-CM

## 2016-03-12 DIAGNOSIS — Z3689 Encounter for other specified antenatal screening: Secondary | ICD-10-CM

## 2016-03-12 DIAGNOSIS — H6092 Unspecified otitis externa, left ear: Secondary | ICD-10-CM

## 2016-03-12 LAB — POCT URINE PREGNANCY: PREG TEST UR: POSITIVE — AB

## 2016-03-12 MED ORDER — NEOMYCIN-COLIST-HC-THONZONIUM 3.3-3-10-0.5 MG/ML OT SUSP: 4.0000 [drp] | Freq: Four times a day (QID) | OTIC | 0 refills | Status: DC

## 2016-03-12 NOTE — Progress Notes (Signed)
   HPI  Patient presents today here with amennorrhea and ear pain  Offered spanish translator which she declines.   PT has not been seen here for more than 3 years, so re-establishing care.   LMP was June 11, Breast tenderness transiently but no nausea. She has 4 other children and 2 Spontaneous Abs  2 weeks of ear itching, irritation, and pain. Her hearing is normal. She has congestion but no dyspnea, chest pain, fever, or sick contacts.   She is tolerating foods and fluids easily. She has not tried any medications for her cold.   She also has had a mild sore throat for 3 days.    PMH: Smoking status noted [id number] Father had HTN Never smoker and not drinking alcohol L ovarian cyst removal previously.  ROS: Per HPI  Objective: BP 104/70 (BP Location: Left Arm, Patient Position: Sitting, Cuff Size: Normal)   Pulse 84   Temp 98 F (36.7 C) (Oral)   Resp 16   Ht 5' 1.5" (1.562 m)   Wt 173 lb (78.5 kg)   LMP 01/27/2016 (Exact Date)   SpO2 99%   BMI 32.16 kg/m  Gen: NAD, alert, cooperative with exam HEENT: NCAT, oropharynx clear, nares with swollen turbinates BL, L external ear canacl red and inflamed, R and L TM WNL CV: RRR, good S1/S2, no murmur Resp: CTABL, no wheezes, non-labored Ext: No edema, warm Neuro: Alert and oriented, No gross deficits  Assessment and plan:  # Pregnancy Discussed, routine pre-natal care recommended  # external otitis media Cortisporin For cold symptoms recommended flonase rtc with any concerns.   Orders Placed This Encounter  Procedures  . POCT urine pregnancy    Meds ordered this encounter  Medications  . Prenatal Multivit-Min-Fe-FA (PRENATAL VITAMINS PO)    Sig: Take 1 tablet by mouth daily.  Marland Kitchen neomycin-colistin-hydrocortisone-thonzonium (CORTISPORIN-TC) 3.10-18-08-0.5 MG/ML otic suspension    Sig: Place 4 drops into the left ear 4 (four) times daily.    Dispense:  10 mL    Refill:  0    Kevin Fenton, MD 9:12 AM

## 2016-03-12 NOTE — Patient Instructions (Addendum)
Great to meet you!  Congratulations on your pregnancy.   I am also treating your for an external ear infection (swimmers ear)   With your congestion, flonase or nasocort would also be helpful.    Otitis Externa Otitis externa is a bacterial or fungal infection of the outer ear canal. This is the area from the eardrum to the outside of the ear. Otitis externa is sometimes called "swimmer's ear." CAUSES  Possible causes of infection include:  Swimming in dirty water.  Moisture remaining in the ear after swimming or bathing.  Mild injury (trauma) to the ear.  Objects stuck in the ear (foreign body).  Cuts or scrapes (abrasions) on the outside of the ear. SIGNS AND SYMPTOMS  The first symptom of infection is often itching in the ear canal. Later signs and symptoms may include swelling and redness of the ear canal, ear pain, and yellowish-white fluid (pus) coming from the ear. The ear pain may be worse when pulling on the earlobe. DIAGNOSIS  Your health care provider will perform a physical exam. A sample of fluid may be taken from the ear and examined for bacteria or fungi. TREATMENT  Antibiotic ear drops are often given for 10 to 14 days. Treatment may also include pain medicine or corticosteroids to reduce itching and swelling. HOME CARE INSTRUCTIONS   Apply antibiotic ear drops to the ear canal as prescribed by your health care provider.  Take medicines only as directed by your health care provider.  If you have diabetes, follow any additional treatment instructions from your health care provider.  Keep all follow-up visits as directed by your health care provider. PREVENTION   Keep your ear dry. Use the corner of a towel to absorb water out of the ear canal after swimming or bathing.  Avoid scratching or putting objects inside your ear. This can damage the ear canal or remove the protective wax that lines the canal. This makes it easier for bacteria and fungi to  grow.  Avoid swimming in lakes, polluted water, or poorly chlorinated pools.  You may use ear drops made of rubbing alcohol and vinegar after swimming. Combine equal parts of white vinegar and alcohol in a bottle. Put 3 or 4 drops into each ear after swimming. SEEK MEDICAL CARE IF:   You have a fever.  Your ear is still red, swollen, painful, or draining pus after 3 days.  Your redness, swelling, or pain gets worse.  You have a severe headache.  You have redness, swelling, pain, or tenderness in the area behind your ear. MAKE SURE YOU:   Understand these instructions.  Will watch your condition.  Will get help right away if you are not doing well or get worse.   This information is not intended to replace advice given to you by your health care provider. Make sure you discuss any questions you have with your health care provider.   Document Released: 08/04/2005 Document Revised: 08/25/2014 Document Reviewed: 08/21/2011 Elsevier Interactive Patient Education 2016 [employer].     IF you received an x-ray today, you will receive an invoice from [facility] Radiology. Please contact [facility] at [phone] with questions or concerns regarding your invoice.   IF you received labwork today, you will receive an invoice from [employer]. Please contact [facility] at [phone] with questions or concerns regarding your invoice.   Our billing staff will not be able to assist you with questions regarding bills from these companies.  You will be  contacted with the lab results as soon as they are available. The fastest way to get your results is to activate your My Chart account. Instructions are located on the last page of this paperwork. If you have not heard from [place] regarding the results in 2 weeks, please contact this office.    We recommend that you schedule a mammogram for breast cancer screening. Typically, you do not need a referral to do  this. Please contact a local imaging center to schedule your mammogram.  [facility] - [phone]  *ask for the Radiology Department [facility]) - [phone] or [phone]  [facility] - [phone] [facility] - [phone] [facility] - [phone]  *ask for the Radiology Department [facility] - [phone]  *ask for the Radiology Department [facility] - [phone]  *ask for the Mammography Department [facility] - [phone]

## 2016-04-17 ENCOUNTER — Encounter: Payer: Self-pay | Admitting: Family Medicine

## 2016-04-17 ENCOUNTER — Ambulatory Visit (INDEPENDENT_AMBULATORY_CARE_PROVIDER_SITE_OTHER): Payer: Self-pay | Admitting: Family Medicine

## 2016-04-17 VITALS — BP 106/67 | HR 65 | Wt 172.0 lb

## 2016-04-17 DIAGNOSIS — Z124 Encounter for screening for malignant neoplasm of cervix: Secondary | ICD-10-CM

## 2016-04-17 DIAGNOSIS — Z3481 Encounter for supervision of other normal pregnancy, first trimester: Secondary | ICD-10-CM

## 2016-04-17 DIAGNOSIS — Z789 Other specified health status: Secondary | ICD-10-CM

## 2016-04-17 DIAGNOSIS — Z36 Encounter for antenatal screening of mother: Secondary | ICD-10-CM

## 2016-04-17 DIAGNOSIS — O09511 Supervision of elderly primigravida, first trimester: Secondary | ICD-10-CM

## 2016-04-17 DIAGNOSIS — Z113 Encounter for screening for infections with a predominantly sexual mode of transmission: Secondary | ICD-10-CM

## 2016-04-17 MED ORDER — HYDROCORTISONE 2.5 % RE CREA: 1.0000 "application " | TOPICAL_CREAM | Freq: Two times a day (BID) | RECTAL | 0 refills | Status: DC

## 2016-04-17 NOTE — Progress Notes (Signed)
  Subjective:    Barbara Coffey is a [id number] 2420w4d being seen today for her first obstetrical visit.  Her obstetrical history is significant for advanced maternal age. Patient does intend to breast feed. Pregnancy history fully reviewed.  Patient reports nausea.  Vitals:   04/17/16 0921  BP: 106/67  Pulse: 65  Weight: 172 lb (78 kg)    HISTORY: OB History  Gravida Para Term Preterm AB Living  7 4 4  0 2 4  SAB TAB Ectopic Multiple Live Births  2 0 0 0 4    # Outcome Date GA Lbr Len/2nd Weight Sex Delivery Anes PTL Lv  7 Current           6 Term 02/13/13 2574w3d 12:00 / 00:16 8 lb 2.9 oz (3.71 kg) F Vag-Spont EPI  LIV  5 SAB  4533w0d            Birth Comments: System Generated. Please review and update pregnancy details.  4 Term  3551w0d  8 lb 10 oz (3.912 kg) M Vag-Spont   LIV  3 Term  7551w0d  7 lb 15 oz (3.6 kg) F Vag-Spont  Y LIV  2 Term  551w0d  7 lb 8 oz (3.402 kg) F Vag-Spont   LIV  1 SAB  6655w0d            Past Medical History:  Diagnosis Date  . No pertinent past medical history    Past Surgical History:  Procedure Laterality Date  . left ovarian cyst     Family History  Problem Relation Age of Onset  . Hypertension Father      Exam    Uterus:     Pelvic Exam:    Perineum: No Hemorrhoids   Vulva: Bartholin's, Urethra, Skene's normal   Vagina:  normal mucosa   Cervix: multiparous appearance   Adnexa: normal adnexa   Bony Pelvis: gynecoid  System:     Skin: normal coloration and turgor, no rashes    Neurologic: gait normal; reflexes normal and symmetric   Extremities: normal strength, tone, and muscle mass   HEENT PERRLA and extra ocular movement intact   Mouth/Teeth mucous membranes moist, pharynx normal without lesions   Neck supple and no masses   Cardiovascular: regular rate and rhythm, no murmurs or gallops   Respiratory:  appears well, vitals normal, no respiratory distress, acyanotic, normal RR, ear and throat exam is normal, neck free of mass  or lymphadenopathy, chest clear, no wheezing, crepitations, rhonchi, normal symmetric air entry   Abdomen: soft, non-tender; bowel sounds normal; no masses,  no organomegaly   Urinary: urethral meatus normal      Assessment:    Pregnancy: [id number] Patient Active Problem List   Diagnosis Date Noted  . Language barrier affecting health care 09/21/2012  . Supervision of other normal pregnancy, antepartum 07/07/2012  . Advanced maternal age, primigravida, antepartum 07/07/2012        Plan:      1. Supervision of other normal pregnancy, antepartum, first trimester Would like PP BTL.  Discussed cost, as pt self pay, and need to  - Cytology - PAP - Prenatal Profile - Hemoglobinopathy Evaluation - Pain Mgmt, Profile 6 Conf w/o mM, U  2. Advanced maternal age, primigravida, antepartum, first trimester Discussed need for US every 4 weeks.  Discussed Panorama, pt declined.  3. Language barrier affecting health care  Candelaria CelesteSTINSON, Onyekachi Gathright JEHIEL 04/17/2016

## 2016-04-17 NOTE — Progress Notes (Signed)
Bedside US shows single IUP with HC measuring 1734w1d and FHR 154

## 2016-04-17 NOTE — Addendum Note (Signed)
Addended by: Arne ClevelandHUTCHINSON, Icelyn Navarrete J on: 04/17/2016 10:11 AM   Modules accepted: Orders

## 2016-04-18 LAB — PRENATAL PROFILE (SOLSTAS)
ANTIBODY SCREEN: NEGATIVE
BASOS ABS: 0 {cells}/uL (ref 0–200)
Basophils Relative: 0 %
EOS ABS: 112 {cells}/uL (ref 15–500)
EOS PCT: 2 %
HCT: 40.3 % (ref 35.0–45.0)
HIV 1&2 Ab, 4th Generation: NONREACTIVE
Hemoglobin: 13.2 g/dL (ref 11.7–15.5)
Hepatitis B Surface Ag: NEGATIVE
LYMPHS PCT: 32 %
Lymphs Abs: 1792 cells/uL (ref 850–3900)
MCH: 29 pg (ref 27.0–33.0)
MCHC: 32.8 g/dL (ref 32.0–36.0)
MCV: 88.6 fL (ref 80.0–100.0)
MONOS PCT: 6 %
MPV: 10.6 fL (ref 7.5–12.5)
Monocytes Absolute: 336 cells/uL (ref 200–950)
NEUTROS PCT: 60 %
Neutro Abs: 3360 cells/uL (ref 1500–7800)
PLATELETS: 238 10*3/uL (ref 140–400)
RBC: 4.55 MIL/uL (ref 3.80–5.10)
RDW: 14.5 % (ref 11.0–15.0)
RH TYPE: POSITIVE
RUBELLA: 10 {index} — AB (ref <0.90)
WBC: 5.6 10*3/uL (ref 3.8–10.8)

## 2016-04-18 LAB — CYTOLOGY - PAP

## 2016-04-19 LAB — PAIN MGMT, PROFILE 6 CONF W/O MM, U
6 ACETYLMORPHINE: NEGATIVE ng/mL (ref <10)
ALCOHOL METABOLITES: NEGATIVE ng/mL (ref <500)
Amphetamines: NEGATIVE ng/mL (ref <500)
BENZODIAZEPINES: NEGATIVE ng/mL (ref <100)
Barbiturates: NEGATIVE ng/mL (ref <300)
Cocaine Metabolite: NEGATIVE ng/mL (ref <150)
Creatinine: 10.6 mg/dL — ABNORMAL LOW (ref >=20.0)
MARIJUANA METABOLITE: NEGATIVE ng/mL (ref <20)
Methadone Metabolite: NEGATIVE ng/mL (ref <100)
OPIATES: NEGATIVE ng/mL (ref <100)
OXYCODONE: NEGATIVE ng/mL (ref <100)
Oxidant: NEGATIVE ug/mL (ref <200)
PLEASE NOTE: 0
Phencyclidine: NEGATIVE ng/mL (ref <25)
Specific Gravity: 1.002 — ABNORMAL LOW (ref >=1.003)
pH: 6.33 (ref 4.5–9.0)

## 2016-04-21 LAB — HEMOGLOBINOPATHY EVALUATION
HCT: 40.8 % (ref 35.0–45.0)
HEMOGLOBIN: 13.2 g/dL (ref 11.7–15.5)
HGB A2 QUANT: 2.4 % (ref 1.8–3.5)
HGB A: 96.6 % (ref >96.0)
Hgb F Quant: <1 % (ref <2.0)
MCH: 29.2 pg (ref 27.0–33.0)
MCV: 90.3 fL (ref 80.0–100.0)
RBC: 4.52 MIL/uL (ref 3.80–5.10)
RDW: 14.2 % (ref 11.0–15.0)

## 2016-04-23 LAB — GC/CHLAMYDIA PROBE AMP (~~LOC~~) NOT AT ARMC
CHLAMYDIA, DNA PROBE: NEGATIVE
NEISSERIA GONORRHEA: NEGATIVE

## 2016-05-15 ENCOUNTER — Ambulatory Visit (INDEPENDENT_AMBULATORY_CARE_PROVIDER_SITE_OTHER): Payer: Self-pay | Admitting: Obstetrics and Gynecology

## 2016-05-15 VITALS — BP 103/69 | HR 77 | Wt 175.0 lb

## 2016-05-15 DIAGNOSIS — Z3482 Encounter for supervision of other normal pregnancy, second trimester: Secondary | ICD-10-CM

## 2016-05-15 DIAGNOSIS — O09512 Supervision of elderly primigravida, second trimester: Secondary | ICD-10-CM

## 2016-05-15 DIAGNOSIS — Z23 Encounter for immunization: Secondary | ICD-10-CM

## 2016-05-15 NOTE — Progress Notes (Signed)
   PRENATAL VISIT NOTE  Subjective:  Barbara Coffey is a 40 y.o. [id number] at 4418w4d being seen today for ongoing prenatal care.  She is currently monitored for the following issues for this high-risk pregnancy and has Supervision of other normal pregnancy, antepartum; Advanced maternal age, primigravida, antepartum; and Language barrier affecting health care on her problem list.  Patient reports no complaints.  Contractions: Not present. Vag. Bleeding: None.  Movement: Present. Denies leaking of fluid.   The following portions of the patient's history were reviewed and updated as appropriate: allergies, current medications, past family history, past medical history, past social history, past surgical history and problem list. Problem list updated.  Objective:   Vitals:   05/15/16 0820  BP: 103/69  Pulse: 77  Weight: 175 lb (79.4 kg)    Fetal Status: Fetal Heart Rate (bpm): 142   Movement: Present     General:  Alert, oriented and cooperative. Patient is in no acute distress.  Skin: Skin is warm and dry. No rash noted.   Cardiovascular: Normal heart rate noted  Respiratory: Normal respiratory effort, no problems with respiration noted  Abdomen: Soft, gravid, appropriate for gestational age. Pain/Pressure: Absent     Pelvic:  Cervical exam deferred        Extremities: Normal range of motion.  Edema: None  Mental Status: Normal mood and affect. Normal behavior. Normal judgment and thought content.   Urinalysis: Urine Protein: Negative Urine Glucose: Negative  Assessment and Plan:  Pregnancy: [id number] at 6518w4d  1. Advanced maternal age, primigravida, antepartum, second trimester Declined testing  2. Supervision of other normal pregnancy, antepartum, second trimester Patient is doing well without complaints Flu vaccine today Anatomy ultrasound scheduled on 10/16 with health department Patient declined quad screen  General obstetric precautions including but not limited to  vaginal bleeding, contractions, leaking of fluid and fetal movement were reviewed in detail with the patient. Please refer to After Visit Summary for other counseling recommendations.  Return in about 4 weeks (around 06/12/2016).  Catalina AntiguaPeggy Davonn Flanery, MD

## 2016-05-15 NOTE — Addendum Note (Signed)
Addended by: Gita KudoLASSITER, KRISTEN S on: 05/15/2016 08:34 AM   Modules accepted: Orders

## 2016-06-06 ENCOUNTER — Encounter: Payer: Self-pay | Admitting: *Deleted

## 2016-06-12 ENCOUNTER — Ambulatory Visit (INDEPENDENT_AMBULATORY_CARE_PROVIDER_SITE_OTHER): Payer: Self-pay | Admitting: Obstetrics and Gynecology

## 2016-06-12 VITALS — BP 116/74 | HR 82 | Wt 180.0 lb

## 2016-06-12 DIAGNOSIS — Z789 Other specified health status: Secondary | ICD-10-CM

## 2016-06-12 DIAGNOSIS — O09512 Supervision of elderly primigravida, second trimester: Secondary | ICD-10-CM

## 2016-06-12 DIAGNOSIS — O09519 Supervision of elderly primigravida, unspecified trimester: Secondary | ICD-10-CM

## 2016-06-12 DIAGNOSIS — Z3480 Encounter for supervision of other normal pregnancy, unspecified trimester: Secondary | ICD-10-CM

## 2016-06-12 NOTE — Progress Notes (Signed)
   PRENATAL VISIT NOTE  Subjective:  Barbara Coffey is a 40 y.o. [id number] at 6654w4d being seen today for ongoing prenatal care.  She is currently monitored for the following issues for this high-risk pregnancy and has Supervision of other normal pregnancy, antepartum; Advanced maternal age, primigravida, antepartum; and Language barrier affecting health care on her problem list.  Patient reports no complaints.  Contractions: Not present. Vag. Bleeding: None.  Movement: Present. Denies leaking of fluid.   The following portions of the patient's history were reviewed and updated as appropriate: allergies, current medications, past family history, past medical history, past social history, past surgical history and problem list. Problem list updated.  Objective:   Vitals:   06/12/16 0831  BP: 116/74  Pulse: 82  Weight: 180 lb (81.6 kg)    Fetal Status: Fetal Heart Rate (bpm): 141   Movement: Present     General:  Alert, oriented and cooperative. Patient is in no acute distress.  Skin: Skin is warm and dry. No rash noted.   Cardiovascular: Normal heart rate noted  Respiratory: Normal respiratory effort, no problems with respiration noted  Abdomen: Soft, gravid, appropriate for gestational age. Pain/Pressure: Absent     Pelvic:  Cervical exam deferred        Extremities: Normal range of motion.  Edema: None  Mental Status: Normal mood and affect. Normal behavior. Normal judgment and thought content.   Assessment and Plan:  Pregnancy: [id number] at 5754w4d  1. Supervision of other normal pregnancy, antepartum Patient is doing well Ultrasound results reviewed  Patient is still interested in pp BTL- Discussed payment plan   2. Advanced maternal age, primigravida, antepartum Declined genetic screening Will start twice weekly testing at 36 weeks  3. Language barrier affecting health care   General obstetric precautions including but not limited to vaginal bleeding, contractions,  leaking of fluid and fetal movement were reviewed in detail with the patient. Please refer to After Visit Summary for other counseling recommendations.  Return in about 4 weeks (around 07/10/2016) for ROB.  Catalina AntiguaPeggy Dariana Garbett, MD

## 2016-07-08 ENCOUNTER — Ambulatory Visit (INDEPENDENT_AMBULATORY_CARE_PROVIDER_SITE_OTHER): Payer: Self-pay | Admitting: Obstetrics and Gynecology

## 2016-07-08 ENCOUNTER — Encounter: Payer: Self-pay | Admitting: Obstetrics and Gynecology

## 2016-07-08 VITALS — BP 108/68 | HR 87 | Wt 184.0 lb

## 2016-07-08 DIAGNOSIS — O09512 Supervision of elderly primigravida, second trimester: Secondary | ICD-10-CM

## 2016-07-08 DIAGNOSIS — O09519 Supervision of elderly primigravida, unspecified trimester: Secondary | ICD-10-CM

## 2016-07-08 DIAGNOSIS — Z789 Other specified health status: Secondary | ICD-10-CM

## 2016-07-08 DIAGNOSIS — Z3480 Encounter for supervision of other normal pregnancy, unspecified trimester: Secondary | ICD-10-CM

## 2016-07-08 NOTE — Progress Notes (Signed)
   PRENATAL VISIT NOTE  Subjective:  Barbara Coffey is a 40 y.o. [id number] at 419w2d being seen today for ongoing prenatal care.  She is currently monitored for the following issues for this high-risk pregnancy and has Supervision of other normal pregnancy, antepartum; Advanced maternal age, primigravida, antepartum; and Language barrier affecting health care on her problem list.  Patient reports no complaints.  Contractions: Not present. Vag. Bleeding: None.  Movement: Present. Denies leaking of fluid.   The following portions of the patient's history were reviewed and updated as appropriate: allergies, current medications, past family history, past medical history, past social history, past surgical history and problem list. Problem list updated.  Objective:   Vitals:   07/08/16 0820  BP: 108/68  Pulse: 87  Weight: 184 lb (83.5 kg)    Fetal Status: Fetal Heart Rate (bpm): 140 Fundal Height: 23 cm Movement: Present     General:  Alert, oriented and cooperative. Patient is in no acute distress.  Skin: Skin is warm and dry. No rash noted.   Cardiovascular: Normal heart rate noted  Respiratory: Normal respiratory effort, no problems with respiration noted  Abdomen: Soft, gravid, appropriate for gestational age. Pain/Pressure: Absent     Pelvic:  Cervical exam deferred        Extremities: Normal range of motion.  Edema: None  Mental Status: Normal mood and affect. Normal behavior. Normal judgment and thought content.   Assessment and Plan:  Pregnancy: [id number] at [redacted]w[redacted]d  1. Supervision of other normal pregnancy, antepartum Patient is doing well without complaints Third trimester labs, 2 hr glucola and Tdap at next visit  2. Advanced maternal age, primigravida, antepartum Will start fetal testing at 36 weeks  3. Language barrier affecting health care Spanish interpreter present during the encounter  Preterm labor symptoms and general obstetric precautions including but not  limited to vaginal bleeding, contractions, leaking of fluid and fetal movement were reviewed in detail with the patient. Please refer to After Visit Summary for other counseling recommendations.  Return in about 4 weeks (around 08/05/2016) for rob and 2 hr glucola.   Catalina AntiguaPeggy Dyana Magner, MD

## 2016-08-05 ENCOUNTER — Ambulatory Visit (INDEPENDENT_AMBULATORY_CARE_PROVIDER_SITE_OTHER): Payer: Self-pay | Admitting: Obstetrics & Gynecology

## 2016-08-05 VITALS — BP 109/74 | HR 84 | Wt 186.0 lb

## 2016-08-05 DIAGNOSIS — O09513 Supervision of elderly primigravida, third trimester: Secondary | ICD-10-CM

## 2016-08-05 DIAGNOSIS — Z23 Encounter for immunization: Secondary | ICD-10-CM

## 2016-08-05 DIAGNOSIS — O09519 Supervision of elderly primigravida, unspecified trimester: Secondary | ICD-10-CM

## 2016-08-05 DIAGNOSIS — Z3483 Encounter for supervision of other normal pregnancy, third trimester: Secondary | ICD-10-CM

## 2016-08-05 LAB — CBC
HCT: 36.5 % (ref 35.0–45.0)
Hemoglobin: 12 g/dL (ref 11.7–15.5)
MCH: 29.7 pg (ref 27.0–33.0)
MCHC: 32.9 g/dL (ref 32.0–36.0)
MCV: 90.3 fL (ref 80.0–100.0)
MPV: 9.9 fL (ref 7.5–12.5)
PLATELETS: 217 10*3/uL (ref 140–400)
RBC: 4.04 MIL/uL (ref 3.80–5.10)
RDW: 14.7 % (ref 11.0–15.0)
WBC: 6.4 10*3/uL (ref 3.8–10.8)

## 2016-08-05 NOTE — Progress Notes (Signed)
   PRENATAL VISIT NOTE  Subjective:  Barbara Coffey is a 40 y.o. [id number] at 7268w2d being seen today for ongoing prenatal care.  She is currently monitored for the following issues for this high-risk pregnancy and has Supervision of other normal pregnancy, antepartum; Advanced maternal age, primigravida, antepartum; and Language barrier affecting health care on her problem list. Patient is Spanish-speaking only, Spanish interpreter present for this encounter.  Patient reports no complaints.  Contractions: Not present. Vag. Bleeding: None.  Movement: Present. Denies leaking of fluid.   The following portions of the patient's history were reviewed and updated as appropriate: allergies, current medications, past family history, past medical history, past social history, past surgical history and problem list. Problem list updated.  Objective:   Vitals:   08/05/16 0918  BP: 109/74  Pulse: 84  Weight: 186 lb (84.4 kg)    Fetal Status: Fetal Heart Rate (bpm): 139 Fundal Height: 29 cm Movement: Present     General:  Alert, oriented and cooperative. Patient is in no acute distress.  Skin: Skin is warm and dry. No rash noted.   Cardiovascular: Normal heart rate noted  Respiratory: Normal respiratory effort, no problems with respiration noted  Abdomen: Soft, gravid, appropriate for gestational age. Pain/Pressure: Absent     Pelvic:  Cervical exam deferred        Extremities: Normal range of motion.  Edema: None  Mental Status: Normal mood and affect. Normal behavior. Normal judgment and thought content.   Assessment and Plan:  Pregnancy: [id number] at 6468w2d  1. Advanced maternal age, primigravida, antepartum [facility]. Will start 2x/week testing at 36 weeks, delivery by 40 weeks. - US OB Follow Up; Future  2. Encounter for supervision of other normal pregnancy in third trimester Third trimester labs and Tdap today. - CBC - RPR - HIV antibody - Glucose  Tolerance, 2 Hours w/1 Hour - Tdap vaccine greater than or equal to 7yo IM Preterm labor symptoms and general obstetric precautions including but not limited to vaginal bleeding, contractions, leaking of fluid and fetal movement were reviewed in detail with the patient. Please refer to After Visit Summary for other counseling recommendations.  Return in about 3 weeks (around 08/26/2016) for OB Visit.   Tereso NewcomerUgonna A Jahne Krukowski, MD

## 2016-08-05 NOTE — Patient Instructions (Signed)
Regrese a la clinica cuando tenga su cita. Si tiene problemas o preguntas, llama a la clinica o vaya a la sala de emergencia al [facility].    

## 2016-08-06 LAB — GLUCOSE TOLERANCE, 2 HOURS W/ 1HR
GLUCOSE: 162 mg/dL
Glucose, 2 hour: 130 mg/dL (ref <140)
Glucose, Fasting: 76 mg/dL (ref 65–99)

## 2016-08-06 LAB — HIV ANTIBODY (ROUTINE TESTING W REFLEX): HIV 1&2 Ab, 4th Generation: NONREACTIVE

## 2016-08-06 LAB — RPR

## 2016-08-18 NOTE — L&D Delivery Note (Signed)
Delivery Note Patient presented in latent labor.  Required augmentation with pitocin.  Progressed to complete with augmentation.  At 7:43 AM a viable female was delivered via Vaginal, Spontaneous Delivery (Presentation: OA).  APGAR: 8, 9; weight pending.   Placenta status: spontaneous, intact.  Active management of third stage of labor.  Cord: 3VC with the following complications: body cord.  Cord gasses not collected.  Anesthesia:  epidural Episiotomy: None Lacerations: 2nd degree;Perineal;Labial Suture Repair: 3.0 monocryl Est. Blood Loss (mL):  350  Mom to postpartum.  Baby to Couplet care / Skin to Skin.  Charlsie MerlesJulia Rhoden 11/01/2016, 8:12 AM  Patient is a [id number] at 3334w6d who was admitted in latent labor w/ labile BP (nl labs), otherwise essentially uncomplicated prenatal course.  She progressed with augmentation via AROM/Pit.  I was gloved and present for delivery in its entirety.  Second stage of labor progressed, baby delivered after approx 3 contractions.  No decels during second stage noted.  Complications: none  Lacerations: as above  EBL: 350cc  SHAW, KIMBERLY, CNM 9:15 AM  11/01/2016

## 2016-08-26 ENCOUNTER — Ambulatory Visit (INDEPENDENT_AMBULATORY_CARE_PROVIDER_SITE_OTHER): Payer: Self-pay | Admitting: Obstetrics & Gynecology

## 2016-08-26 VITALS — BP 109/71 | HR 88 | Wt 189.0 lb

## 2016-08-26 DIAGNOSIS — Z3480 Encounter for supervision of other normal pregnancy, unspecified trimester: Secondary | ICD-10-CM

## 2016-08-26 DIAGNOSIS — O09513 Supervision of elderly primigravida, third trimester: Secondary | ICD-10-CM

## 2016-08-26 DIAGNOSIS — Z789 Other specified health status: Secondary | ICD-10-CM

## 2016-08-26 DIAGNOSIS — O09519 Supervision of elderly primigravida, unspecified trimester: Secondary | ICD-10-CM

## 2016-08-26 NOTE — Progress Notes (Signed)
   PRENATAL VISIT NOTE  Subjective:  Barbara Coffey is a H  41 y.o. [id number] at 3073w2d being seen today for ongoing prenatal care.  She is currently monitored for the following issues for this low-risk pregnancy and has Supervision of other normal pregnancy, antepartum; Advanced maternal age, primigravida, antepartum; and Language barrier affecting health care on her problem list.  Patient reports no complaints.  Contractions: Not present. Vag. Bleeding: None.  Movement: Present. Denies leaking of fluid.   The following portions of the patient's history were reviewed and updated as appropriate: allergies, current medications, past family history, past medical history, past social history, past surgical history and problem list. Problem list updated.  Objective:   Vitals:   08/26/16 0925  BP: 109/71  Pulse: 88  Weight: 189 lb (85.7 kg)    Fetal Status:     Movement: Present     General:  Alert, oriented and cooperative. Patient is in no acute distress.  Skin: Skin is warm and dry. No rash noted.   Cardiovascular: Normal heart rate noted  Respiratory: Normal respiratory effort, no problems with respiration noted  Abdomen: Soft, gravid, appropriate for gestational age. Pain/Pressure: Present     Pelvic:  Cervical exam deferred        Extremities: Normal range of motion.  Edema: Trace  Mental Status: Normal mood and affect. Normal behavior. Normal judgment and thought content.   Assessment and Plan:  Pregnancy: [id number] at 873w2d  1. Supervision of other normal pregnancy, antepartum - doing well  2. Language barrier affecting health care - Interpretor present at visits  3. Advanced maternal age, primigravida, antepartum - Declined genetic testing, has not kept u/s visits  Preterm labor symptoms and general obstetric precautions including but not limited to vaginal bleeding, contractions, leaking of fluid and fetal movement were reviewed in detail with the patient. Please  refer to After Visit Summary for other counseling recommendations.  Return in about 2 weeks (around 09/09/2016).   Allie BossierMyra C Amberlynn Tempesta, MD

## 2016-09-09 ENCOUNTER — Ambulatory Visit (INDEPENDENT_AMBULATORY_CARE_PROVIDER_SITE_OTHER): Payer: Self-pay | Admitting: Obstetrics & Gynecology

## 2016-09-09 VITALS — BP 109/68 | HR 92 | Wt 192.0 lb

## 2016-09-09 DIAGNOSIS — O09523 Supervision of elderly multigravida, third trimester: Secondary | ICD-10-CM

## 2016-09-09 DIAGNOSIS — Z3480 Encounter for supervision of other normal pregnancy, unspecified trimester: Secondary | ICD-10-CM

## 2016-09-09 NOTE — Progress Notes (Signed)
   PRENATAL VISIT NOTE  Subjective:  Barbara Coffey is a 41 y.o. [id number] at 7242w2d being seen today for ongoing prenatal care.  She is currently monitored for the following issues for this low-risk pregnancy and has Supervision of other normal pregnancy, antepartum; Advanced maternal age, primigravida, antepartum; and Language barrier affecting health care on her problem list.  Patient is Spanish-speaking only, Spanish interpreter present for this encounter.  Patient reports occasional contractions and increased pelvic pressure, wants cervical check.  Contractions: Not present. Vag. Bleeding: None.  Movement: Present. Denies leaking of fluid.   The following portions of the patient's history were reviewed and updated as appropriate: allergies, current medications, past family history, past medical history, past social history, past surgical history and problem list. Problem list updated.  Objective:   Vitals:   09/09/16 0925  BP: 109/68  Pulse: 92  Weight: 192 lb (87.1 kg)    Fetal Status: Fetal Heart Rate (bpm): 145 Fundal Height: 33 cm Movement: Present  Presentation: Undeterminable  General:  Alert, oriented and cooperative. Patient is in no acute distress.  Skin: Skin is warm and dry. No rash noted.   Cardiovascular: Normal heart rate noted  Respiratory: Normal respiratory effort, no problems with respiration noted  Abdomen: Soft, gravid, appropriate for gestational age. Pain/Pressure: Present     Pelvic:  Cervical exam performed Dilation: Fingertip Effacement (%): Thick Station: Ballotable  Extremities: Normal range of motion.  Edema: Trace  Mental Status: Normal mood and affect. Normal behavior. Normal judgment and thought content.   Assessment and Plan:  Pregnancy: [id number] at 8542w2d  1. Advanced maternal age in multigravida, third trimester Will get ultrasound soon for growth. Antenatal testing to start at 36 weeks, patient informed.  2. Supervision of other normal  pregnancy, antepartum Reassured by cervical exam. Preterm labor symptoms and general obstetric precautions including but not limited to vaginal bleeding, contractions, leaking of fluid and fetal movement were reviewed in detail with the patient. Please refer to After Visit Summary for other counseling recommendations.  Return in about 2 weeks (around 09/23/2016) for OB Visit.   Tereso NewcomerUgonna A Dayanis Bergquist, MD

## 2016-09-09 NOTE — Patient Instructions (Signed)
Return to clinic for any scheduled appointments or obstetric concerns, or go to [facility] for evaluation  

## 2016-09-22 ENCOUNTER — Other Ambulatory Visit: Payer: Self-pay | Admitting: *Deleted

## 2016-09-22 DIAGNOSIS — O09519 Supervision of elderly primigravida, unspecified trimester: Secondary | ICD-10-CM

## 2016-09-23 ENCOUNTER — Encounter: Payer: Self-pay | Admitting: Obstetrics & Gynecology

## 2016-09-23 ENCOUNTER — Ambulatory Visit (INDEPENDENT_AMBULATORY_CARE_PROVIDER_SITE_OTHER): Payer: Self-pay | Admitting: Obstetrics & Gynecology

## 2016-09-23 ENCOUNTER — Ambulatory Visit: Payer: Self-pay | Admitting: Obstetrics & Gynecology

## 2016-09-23 VITALS — BP 127/76 | HR 102 | Wt 195.0 lb

## 2016-09-23 DIAGNOSIS — O4400 Placenta previa specified as without hemorrhage, unspecified trimester: Secondary | ICD-10-CM | POA: Insufficient documentation

## 2016-09-23 DIAGNOSIS — O4403 Placenta previa specified as without hemorrhage, third trimester: Secondary | ICD-10-CM

## 2016-09-23 DIAGNOSIS — O09523 Supervision of elderly multigravida, third trimester: Secondary | ICD-10-CM

## 2016-09-23 DIAGNOSIS — Z3480 Encounter for supervision of other normal pregnancy, unspecified trimester: Secondary | ICD-10-CM

## 2016-09-23 NOTE — Progress Notes (Signed)
   PRENATAL VISIT NOTE  Subjective:  Barbara Coffey is a 41 y.o. [id number] at 2238w2d being seen today for ongoing prenatal care.  She is currently monitored for the following issues for this low-risk pregnancy and has Supervision of other normal pregnancy, antepartum; Advanced maternal age, primigravida, antepartum; Language barrier affecting health care; and Placenta previa antepartum on her problem list. .Patient is Spanish-speaking only, Spanish interpreter present for this encounter.  Patient reports no complaints.  Contractions: Not present. Vag. Bleeding: None.  Movement: Present. Denies leaking of fluid.   The following portions of the patient's history were reviewed and updated as appropriate: allergies, current medications, past family history, past medical history, past social history, past surgical history and problem list. Problem list updated.  Objective:   Vitals:   09/23/16 1414  BP: 127/76  Pulse: (!) 102  Weight: 195 lb (88.5 kg)    Fetal Status: Fetal Heart Rate (bpm): 136 Fundal Height: 36 cm Movement: Present     General:  Alert, oriented and cooperative. Patient is in no acute distress.  Skin: Skin is warm and dry. No rash noted.   Cardiovascular: Normal heart rate noted  Respiratory: Normal respiratory effort, no problems with respiration noted  Abdomen: Soft, gravid, appropriate for gestational age. Pain/Pressure: Present     Pelvic:  Cervical exam deferred        Extremities: Normal range of motion.  Edema: Trace  Mental Status: Normal mood and affect. Normal behavior. Normal judgment and thought content.   Assessment and Plan:  Pregnancy: [id number] at 6138w2d  1. Placenta previa antepartum Will follow up ultrasound report  2. Advanced maternal age in multigravida, third trimester Will start antenatal testing at 36 weeks. Normal 34 week growth scan 68% EFW  3. Supervision of other normal pregnancy, antepartum Preterm labor symptoms and general  obstetric precautions including but not limited to vaginal bleeding, contractions, leaking of fluid and fetal movement were reviewed in detail with the patient. Please refer to After Visit Summary for other counseling recommendations.  Return in about 2 weeks (around 10/07/2016) for NST, Pelvic cultures OB Visit then NST/AFI 3 days later.Tereso Newcomer.   Nika Yazzie A Reagan Klemz, MD

## 2016-09-23 NOTE — Patient Instructions (Signed)
Regrese a la clinica cuando tenga su cita. Si tiene problemas o preguntas, llama a la clinica o vaya a la sala de emergencia al [facility].    

## 2016-10-07 ENCOUNTER — Ambulatory Visit (INDEPENDENT_AMBULATORY_CARE_PROVIDER_SITE_OTHER): Payer: Self-pay | Admitting: Obstetrics & Gynecology

## 2016-10-07 VITALS — BP 112/72 | HR 84 | Wt 194.0 lb

## 2016-10-07 DIAGNOSIS — Z113 Encounter for screening for infections with a predominantly sexual mode of transmission: Secondary | ICD-10-CM

## 2016-10-07 DIAGNOSIS — Z3480 Encounter for supervision of other normal pregnancy, unspecified trimester: Secondary | ICD-10-CM

## 2016-10-07 DIAGNOSIS — Z3483 Encounter for supervision of other normal pregnancy, third trimester: Secondary | ICD-10-CM

## 2016-10-07 NOTE — Progress Notes (Signed)
   PRENATAL VISIT NOTE  Subjective:  Barbara Coffey is a 41 y.o. [id number] at 7111w2d being seen today for ongoing prenatal care.  She is currently monitored for the following issues for this low-risk pregnancy and has Supervision of other normal pregnancy, antepartum; Advanced maternal age, primigravida, antepartum; and Language barrier affecting health care on her problem list. Patient is Spanish-speaking only, Spanish interpreter present for this encounter.  Patient reports no complaints.  Contractions: Not present. Vag. Bleeding: None.  Movement: Present. Denies leaking of fluid.   The following portions of the patient's history were reviewed and updated as appropriate: allergies, current medications, past family history, past medical history, past social history, past surgical history and problem list. Problem list updated.  Objective:   Vitals:   10/07/16 1356  BP: 112/72  Pulse: 84  Weight: 194 lb (88 kg)    Fetal Status: Fetal Heart Rate (bpm): 135 Fundal Height: 38 cm Movement: Present  Presentation: Vertex  General:  Alert, oriented and cooperative. Patient is in no acute distress.  Skin: Skin is warm and dry. No rash noted.   Cardiovascular: Normal heart rate noted  Respiratory: Normal respiratory effort, no problems with respiration noted  Abdomen: Soft, gravid, appropriate for gestational age. Pain/Pressure: Present     Pelvic:  Cervical exam performed Dilation: 1 Effacement (%): Thick Station: Ballotable  Extremities: Normal range of motion.  Edema: Trace  Mental Status: Normal mood and affect. Normal behavior. Normal judgment and thought content.   Assessment and Plan:  Pregnancy: [id number] at 4711w2d  1. Supervision of other normal pregnancy, antepartum Resolved placenta previa on scan done last week.  Pelvic cultures done today. - Strep Gp B NAA - [facility]lam[facility] Preterm labor symptoms and general obstetric precautions including  but not limited to vaginal bleeding, contractions, leaking of fluid and fetal movement were reviewed in detail with the patient. Please refer to After Visit Summary for other counseling recommendations.  Return in about 1 week (around 10/14/2016) for OB Visit.   Tereso NewcomerUgonna A Anyanwu, MD

## 2016-10-07 NOTE — Patient Instructions (Signed)
Regrese a la clinica cuando tenga su cita. Si tiene problemas o preguntas, llama a la clinica o vaya a la sala de emergencia al [facility].    

## 2016-10-08 LAB — GC/CHLAMYDIA PROBE AMP (~~LOC~~) NOT AT ARMC
CHLAMYDIA, DNA PROBE: NEGATIVE
Neisseria Gonorrhea: NEGATIVE

## 2016-10-09 ENCOUNTER — Encounter: Payer: Self-pay | Admitting: Obstetrics & Gynecology

## 2016-10-09 LAB — STREP GP B NAA: Strep Gp B NAA: POSITIVE — AB

## 2016-10-10 ENCOUNTER — Ambulatory Visit (INDEPENDENT_AMBULATORY_CARE_PROVIDER_SITE_OTHER): Payer: Self-pay | Admitting: *Deleted

## 2016-10-10 VITALS — BP 114/73 | HR 73 | Wt 195.0 lb

## 2016-10-10 DIAGNOSIS — Z3483 Encounter for supervision of other normal pregnancy, third trimester: Secondary | ICD-10-CM

## 2016-10-10 DIAGNOSIS — Z3480 Encounter for supervision of other normal pregnancy, unspecified trimester: Secondary | ICD-10-CM

## 2016-10-10 DIAGNOSIS — O09519 Supervision of elderly primigravida, unspecified trimester: Secondary | ICD-10-CM

## 2016-10-10 NOTE — Progress Notes (Signed)
NST performed today was reviewed and was found to be reactive.  AFI was also normal.  Continue recommended antenatal testing and prenatal care. 

## 2016-10-10 NOTE — Progress Notes (Signed)
Nurse visit for NST and AFI dx: AMA

## 2016-10-14 ENCOUNTER — Ambulatory Visit (INDEPENDENT_AMBULATORY_CARE_PROVIDER_SITE_OTHER): Payer: Self-pay | Admitting: Family Medicine

## 2016-10-14 VITALS — BP 115/75 | HR 90 | Wt 196.0 lb

## 2016-10-14 DIAGNOSIS — O09519 Supervision of elderly primigravida, unspecified trimester: Secondary | ICD-10-CM

## 2016-10-14 DIAGNOSIS — Z3480 Encounter for supervision of other normal pregnancy, unspecified trimester: Secondary | ICD-10-CM

## 2016-10-14 DIAGNOSIS — O09513 Supervision of elderly primigravida, third trimester: Secondary | ICD-10-CM

## 2016-10-14 DIAGNOSIS — O99820 Streptococcus B carrier state complicating pregnancy: Secondary | ICD-10-CM

## 2016-10-14 NOTE — Progress Notes (Signed)
   PRENATAL VISIT NOTE  Subjective:  Barbara Coffey is a 41 y.o. [id number] at 4270w2d being seen today for ongoing prenatal care.  She is currently monitored for the following issues for this high-risk pregnancy and has Supervision of other normal pregnancy, antepartum; Advanced maternal age, primigravida, antepartum; Language barrier affecting health care; and Group B Streptococcus carrier, +RV culture, currently pregnant on her problem list.  Patient reports contractions since last week.  Contractions: Irregular. Vag. Bleeding: None.  Movement: Present. Denies leaking of fluid.   The following portions of the patient's history were reviewed and updated as appropriate: allergies, current medications, past family history, past medical history, past social history, past surgical history and problem list. Problem list updated.  Objective:   Vitals:   10/14/16 1400  BP: 115/75  Pulse: 90  Weight: 196 lb (88.9 kg)    Fetal Status: Fetal Heart Rate (bpm): NST Fundal Height: 37 cm Movement: Present  Presentation: Vertex  General:  Alert, oriented and cooperative. Patient is in no acute distress.  Skin: Skin is warm and dry. No rash noted.   Cardiovascular: Normal heart rate noted  Respiratory: Normal respiratory effort, no problems with respiration noted  Abdomen: Soft, gravid, appropriate for gestational age. Pain/Pressure: Present     Pelvic:  Cervical exam performed Dilation: 1.5 Effacement (%): 50 Station: -3  Extremities: Normal range of motion.  Edema: None  Mental Status: Normal mood and affect. Normal behavior. Normal judgment and thought content.  NST reviewed and reactive.  Assessment and Plan:  Pregnancy: [id number] at 6670w2d  1. Advanced maternal age, primigravida, antepartum In 2x/wk testing-continue IOL at 5640 wks-->schedule next visit  2. Supervision of other normal pregnancy, antepartum Continue prenatal care.   3. Group B Streptococcus carrier, +RV culture,  currently pregnant Will need treatment in labor  Term labor symptoms and general obstetric precautions including but not limited to vaginal bleeding, contractions, leaking of fluid and fetal movement were reviewed in detail with the patient. Please refer to After Visit Summary for other counseling recommendations.  Return in 1 week (on 10/21/2016).   Reva Boresanya S Pratt, MD

## 2016-10-14 NOTE — Patient Instructions (Signed)
Tercer trimestre de embarazo (Third Trimester of Pregnancy) El tercer trimestre comprende desde la semana29 hasta la semana42, es decir, desde el mes7 hasta el mes9. El tercer trimestre es un perodo en el que el feto crece rpidamente. Hacia el final del noveno mes, el feto mide alrededor de 20pulgadas (45cm) de largo y pesa entre 6 y 10 libras (2,700 y 4,500kg). CAMBIOS EN EL ORGANISMO Su organismo atraviesa por muchos cambios durante el embarazo, y estos varan de una mujer a otra.  Seguir aumentando de peso. Es de esperar que aumente entre 25 y 35libras (11 y 16kg) hacia el final del embarazo.  Podrn aparecer las primeras estras en las caderas, el abdomen y las mamas.  Puede tener necesidad de orinar con ms frecuencia porque el feto baja hacia la pelvis y ejerce presin sobre la vejiga.  Debido al embarazo podr sentir acidez estomacal con frecuencia.  Puede estar estreida, ya que ciertas hormonas enlentecen los movimientos de los msculos que empujan los desechos a travs de los intestinos.  Pueden aparecer hemorroides o abultarse e hincharse las venas (venas varicosas).  Puede sentir dolor plvico debido al aumento de peso y a que las hormonas del embarazo relajan las articulaciones entre los huesos de la pelvis. El dolor de espalda puede ser consecuencia de la sobrecarga de los msculos que soportan la postura.  Tal vez haya cambios en el cabello que pueden incluir su engrosamiento, crecimiento rpido y cambios en la textura. Adems, a algunas mujeres se les cae el cabello durante o despus del embarazo, o tienen el cabello seco o fino. Lo ms probable es que el cabello se le normalice despus del nacimiento del beb.  Las mamas seguirn creciendo y le dolern. A veces, puede haber una secrecin amarilla de las mamas llamada calostro.  El ombligo puede salir hacia afuera.  Puede sentir que le falta el aire debido a que se expande el tero.  Puede notar que el feto  "baja" o lo siente ms bajo, en el abdomen.  Puede tener una prdida de secrecin mucosa con sangre. Esto suele ocurrir en el trmino de unos pocos das a una semana antes de que comience el trabajo de parto.  El cuello del tero se vuelve delgado y blando (se borra) cerca de la fecha de parto. QU DEBE ESPERAR EN LOS EXMENES PRENATALES Le harn exmenes prenatales cada 2semanas hasta la semana36. A partir de ese momento le harn exmenes semanales. Durante una visita prenatal de rutina:  La pesarn para asegurarse de que usted y el feto estn creciendo normalmente.  Le tomarn la presin arterial.  Le medirn el abdomen para controlar el desarrollo del beb.  Se escucharn los latidos cardacos fetales.  Se evaluarn los resultados de los estudios solicitados en visitas anteriores.  Le revisarn el cuello del tero cuando est prxima la fecha de parto para controlar si este se ha borrado. Alrededor de la semana36, el mdico le revisar el cuello del tero. Al mismo tiempo, realizar un anlisis de las secreciones del tejido vaginal. Este examen es para determinar si hay un tipo de bacteria, estreptococo Grupo B. El mdico le explicar esto con ms detalle. El mdico puede preguntarle lo siguiente:  Cmo le gustara que fuera el parto.  Cmo se siente.  Si siente los movimientos del beb.  Si ha tenido sntomas anormales, como prdida de lquido, sangrado, dolores de cabeza intensos o clicos abdominales.  Si est consumiendo algn producto que contenga tabaco, como cigarrillos, tabaco de mascar y   cigarrillos electrnicos.  Si tiene alguna pregunta. Otros exmenes o estudios de deteccin que pueden realizarse durante el tercer trimestre incluyen lo siguiente:  Anlisis de sangre para controlar los niveles de hierro (anemia).  Controles fetales para determinar su salud, nivel de actividad y crecimiento. Si tiene alguna enfermedad o hay problemas durante el embarazo, le harn  estudios.  Prueba del VIH (virus de inmunodeficiencia humana). Si corre un riesgo alto, pueden realizarle una prueba de deteccin del VIH durante el tercer trimestre del embarazo. FALSO TRABAJO DE PARTO Es posible que sienta contracciones leves e irregulares que finalmente desaparecen. Se llaman contracciones de Braxton Hicks o falso trabajo de parto. Las contracciones pueden durar horas, das o incluso semanas, antes de que el verdadero trabajo de parto se inicie. Si las contracciones ocurren a intervalos regulares, se intensifican o se hacen dolorosas, lo mejor es que la revise el mdico. SIGNOS DE TRABAJO DE PARTO  Clicos de tipo menstrual.  Contracciones cada 5minutos o menos.  Contracciones que comienzan en la parte superior del tero y se extienden hacia abajo, a la zona inferior del abdomen y la espalda.  Sensacin de mayor presin en la pelvis o dolor de espalda.  Una secrecin de mucosidad acuosa o con sangre que sale de la vagina. Si tiene alguno de estos signos antes de la semana37 del embarazo, llame a su mdico de inmediato. Debe concurrir al hospital para que la controlen inmediatamente. INSTRUCCIONES PARA EL CUIDADO EN EL HOGAR  Evite fumar, consumir hierbas, beber alcohol y tomar frmacos que no le hayan recetado. Estas sustancias qumicas afectan la formacin y el desarrollo del beb.  No consuma ningn producto que contenga tabaco, lo que incluye cigarrillos, tabaco de mascar y cigarrillos electrnicos. Si necesita ayuda para dejar de fumar, consulte al mdico. Puede recibir asesoramiento y otro tipo de recursos para dejar de fumar.  Siga las indicaciones del mdico en relacin con el uso de medicamentos. Durante el embarazo, hay medicamentos que son seguros de tomar y otros que no.  Haga ejercicio solamente como se lo haya indicado el mdico. Sentir clicos uterinos es un buen signo para detener la actividad fsica.  Contine comiendo alimentos sanos con  regularidad.  Use un sostn que le brinde buen soporte si le duelen las mamas.  No se d baos de inmersin en agua caliente, baos turcos ni saunas.  Use el cinturn de seguridad en todo momento mientras conduce.  No coma carne cruda ni queso sin cocinar; evite el contacto con las bandejas sanitarias de los gatos y la tierra que estos animales usan. Estos elementos contienen grmenes que pueden causar defectos congnitos en el beb.  Tome las vitaminas prenatales.  Tome entre 1500 y 2000mg de calcio diariamente comenzando en la semana20 del embarazo hasta el parto.  Si est estreida, pruebe un laxante suave (si el mdico lo autoriza). Consuma ms alimentos ricos en fibra, como vegetales y frutas frescos y cereales integrales. Beba gran cantidad de lquido para mantener la orina de tono claro o color amarillo plido.  Dese baos de asiento con agua tibia para aliviar el dolor o las molestias causadas por las hemorroides. Use una crema para las hemorroides si el mdico la autoriza.  Si tiene venas varicosas, use medias de descanso. Eleve los pies durante 15minutos, 3 o 4veces por da. Limite el consumo de sal en su dieta.  Evite levantar objetos pesados, use zapatos de tacones bajos y mantenga una buena postura.  Descanse con las piernas elevadas si tiene   calambres o dolor de cintura.  Visite a su dentista si no lo ha hecho durante el embarazo. Use un cepillo de dientes blando para higienizarse los dientes y psese el hilo dental con suavidad.  Puede seguir manteniendo relaciones sexuales, a menos que el mdico le indique lo contrario.  No haga viajes largos excepto que sea absolutamente necesario y solo con la autorizacin del mdico.  Tome clases prenatales para entender, practicar y hacer preguntas sobre el trabajo de parto y el parto.  Haga un ensayo de la partida al hospital.  Prepare el bolso que llevar al hospital.  Prepare la habitacin del beb.  Concurra a todas  las visitas prenatales segn las indicaciones de su mdico.  SOLICITE ATENCIN MDICA SI:  No est segura de que est en trabajo de parto o de que ha roto la bolsa de las aguas.  Tiene mareos.  Siente clicos leves, presin en la pelvis o dolor persistente en el abdomen.  Tiene nuseas, vmitos o diarrea persistentes.  Observa una secrecin vaginal con mal olor.  Siente dolor al orinar.  SOLICITE ATENCIN MDICA DE INMEDIATO SI:  Tiene fiebre.  Tiene una prdida de lquido por la vagina.  Tiene sangrado o pequeas prdidas vaginales.  Siente dolor intenso o clicos en el abdomen.  Sube o baja de peso rpidamente.  Tiene dificultad para respirar y siente dolor de pecho.  Sbitamente se le hinchan mucho el rostro, las manos, los tobillos, los pies o las piernas.  No ha sentido los movimientos del beb durante una hora.  Siente un dolor de cabeza intenso que no se alivia con medicamentos.  Su visin se modifica.  Esta informacin no tiene como fin reemplazar el consejo del mdico. Asegrese de hacerle al mdico cualquier pregunta que tenga. Document Released: 05/14/2005 Document Revised: 08/25/2014 Document Reviewed: 10/05/2012 Elsevier Interactive Patient Education  2017 [employer]   Lactancia materna (Breastfeeding) Decidir amamantar es una de las mejores elecciones que puede hacer por usted y su beb. El cambio hormonal durante el embarazo produce el desarrollo del tejido mamario y aumenta la cantidad y el tamao de los conductos galactforos. Estas hormonas tambin permiten que las protenas, los azcares y las grasas de la sangre produzcan la leche materna en las glndulas productoras de leche. Las hormonas impiden que la leche materna sea liberada antes del nacimiento del beb, adems de impulsar el flujo de leche luego del nacimiento. Una vez que ha comenzado a amamantar, pensar en el beb, as como la succin o el llanto, pueden estimular la liberacin de leche  de las glndulas productoras de leche. LOS BENEFICIOS DE AMAMANTAR Para el beb  La primera leche (calostro) ayuda a mejorar el funcionamiento del sistema digestivo del beb.  La leche tiene anticuerpos que ayudan a prevenir las infecciones en el beb.  El beb tiene una menor incidencia de asma, alergias y del sndrome de muerte sbita del lactante.  Los nutrientes en la leche materna son mejores para el beb que la leche maternizada y estn preparados exclusivamente para cubrir las necesidades del beb.  La leche materna mejora el desarrollo cerebral del beb.  Es menos probable que el beb desarrolle otras enfermedades, como obesidad infantil, asma o diabetes mellitus de tipo 2. Para usted  La lactancia materna favorece el desarrollo de un vnculo muy especial entre la madre y el beb.  Es conveniente. La leche materna siempre est disponible a la temperatura correcta y es econmica.  La lactancia materna ayuda a quemar caloras y   a perder el peso ganado durante el embarazo.  Favorece la contraccin del tero al tamao que tena antes del embarazo de manera ms rpida y disminuye el sangrado (loquios) despus del parto.  La lactancia materna contribuye a reducir el riesgo de desarrollar diabetes mellitus de tipo 2, osteoporosis o cncer de mama o de ovario en el futuro. SIGNOS DE QUE EL BEB EST HAMBRIENTO Primeros signos de hambre  Aumenta su estado de alerta o actividad.  Se estira.  Mueve la cabeza de un lado a otro.  Mueve la cabeza y abre la boca cuando se le toca la mejilla o la comisura de la boca (reflejo de bsqueda).  Aumenta las vocalizaciones, tales como sonidos de succin, se relame los labios, emite arrullos, suspiros, o chirridos.  Mueve la mano hacia la boca.  Se chupa con ganas los dedos o las manos. Signos tardos de hambre  Est agitado.  Llora de manera intermitente. Signos de hambre extrema Los signos de hambre extrema requerirn que lo calme y  lo consuele antes de que el beb pueda alimentarse adecuadamente. No espere a que se manifiesten los siguientes signos de hambre extrema para comenzar a amamantar:  Agitacin.  Llanto intenso y fuerte.  Gritos. INFORMACIN BSICA SOBRE LA LACTANCIA MATERNA Iniciacin de la lactancia materna  Encuentre un lugar cmodo para sentarse o acostarse, con un buen respaldo para el cuello y la espalda.  Coloque una almohada o una manta enrollada debajo del beb para acomodarlo a la altura de la mama (si est sentada). Las almohadas para amamantar se han diseado especialmente a fin de servir de apoyo para los brazos y el beb mientras amamanta.  Asegrese de que el abdomen del beb est frente al suyo.  Masajee suavemente la mama. Con las yemas de los dedos, masajee la pared del pecho hacia el pezn en un movimiento circular. Esto estimula el flujo de leche. Es posible que deba continuar este movimiento mientras amamanta si la leche fluye lentamente.  Sostenga la mama con el pulgar por arriba del pezn y los otros 4 dedos por debajo de la mama. Asegrese de que los dedos se encuentren lejos del pezn y de la boca del beb.  Empuje suavemente los labios del beb con el pezn o con el dedo.  Cuando la boca del beb se abra lo suficiente, acrquelo rpidamente a la mama e introduzca todo el pezn y la zona oscura que lo rodea (areola), tanto como sea posible, dentro de la boca del beb. ? Debe haber ms areola visible por arriba del labio superior del beb que por debajo del labio inferior. ? La lengua del beb debe estar entre la enca inferior y la mama.  Asegrese de que la boca del beb est en la posicin correcta alrededor del pezn (prendida). Los labios del beb deben crear un sello sobre la mama y estar doblados hacia afuera (invertidos).  Es comn que el beb succione durante 2 a 3 minutos para que comience el flujo de leche materna. Cmo debe prenderse Es muy importante que le ensee al  beb cmo prenderse adecuadamente a la mama. Si el beb no se prende adecuadamente, puede causarle dolor en el pezn y reducir la produccin de leche materna, y hacer que el beb tenga un escaso aumento de peso. Adems, si el beb no se prende adecuadamente al pezn, puede tragar aire durante la alimentacin. Esto puede causarle molestias al beb. Hacer eructar al beb al cambiar de mama puede ayudarlo a liberar el   aire. Sin embargo, ensearle al beb cmo prenderse a la mama adecuadamente es la mejor manera de evitar que se sienta molesto por tragar aire mientras se alimenta. Signos de que el beb se ha prendido adecuadamente al pezn:  Tironea o succiona de modo silencioso, sin causarle dolor.  Se escucha que traga cada 3 o 4 succiones.  Hay movimientos musculares por arriba y por delante de sus odos al succionar. Signos de que el beb no se ha prendido adecuadamente al pezn:  Hace ruidos de succin o de chasquido mientras se alimenta.  Siente dolor en el pezn. Si cree que el beb no se prendi correctamente, deslice el dedo en la comisura de la boca y colquelo entre las encas del beb para interrumpir la succin. Intente comenzar a amamantar nuevamente. Signos de lactancia materna exitosa Signos del beb:  Disminuye gradualmente el nmero de succiones o cesa la succin por completo.  Se duerme.  Relaja el cuerpo.  Retiene una pequea cantidad de leche en la boca.  Se desprende solo del pecho. Signos que presenta usted:  Las mamas han aumentado la firmeza, el peso y el tamao 1 a 3 horas despus de amamantar.  Estn ms blandas inmediatamente despus de amamantar.  Un aumento del volumen de leche, y tambin un cambio en su consistencia y color se producen hacia el quinto da de lactancia materna.  Los pezones no duelen, ni estn agrietados ni sangran. Signos de que su beb recibe la cantidad de leche suficiente  Mojar por lo menos 1 o 2 paales durante las primeras 24  horas despus del nacimiento.  Mojar por lo menos 5 o 6 paales cada 24 horas durante la primera semana despus del nacimiento. La orina debe ser transparente o de color amarillo plido a los 5 das despus del nacimiento.  Mojar entre 6 y 8 paales cada 24 horas a medida que el beb sigue creciendo y desarrollndose.  Defeca al menos 3 veces en 24 horas a los 5 das de vida. La materia fecal debe ser blanda y amarillenta.  Defeca al menos 3 veces en 24 horas a los 7 das de vida. La materia fecal debe ser grumosa y amarillenta.  No registra una prdida de peso mayor del 10% del peso al nacer durante los primeros 3 das de vida.  Aumenta de peso un promedio de 4 a 7onzas (113 a 198g) por semana despus de los 4 das de vida.  Aumenta de peso, diariamente, de manera uniforme a partir de los 5 das de vida, sin registrar prdida de peso despus de las 2semanas de vida. Despus de alimentarse, es posible que el beb regurgite una pequea cantidad. Esto es frecuente. FRECUENCIA Y DURACIN DE LA LACTANCIA MATERNA El amamantamiento frecuente la ayudar a producir ms leche y a prevenir problemas de dolor en los pezones e hinchazn en las mamas. Alimente al beb cuando muestre signos de hambre o si siente la necesidad de reducir la congestin de las mamas. Esto se denomina "lactancia a demanda". Evite el uso del chupete mientras trabaja para establecer la lactancia (las primeras 4 a 6 semanas despus del nacimiento del beb). Despus de este perodo, podr ofrecerle un chupete. Las investigaciones demostraron que el uso del chupete durante el primer ao de vida del beb disminuye el riesgo de desarrollar el sndrome de muerte sbita del lactante (SMSL). Permita que el nio se alimente en cada mama todo lo que desee. Contine amamantando al beb hasta que haya terminado de alimentarse. Cuando   el beb se desprende o se queda dormido mientras se est alimentando de la primera mama, ofrzcale la segunda.  Debido a que, con frecuencia, los recin nacidos permanecen somnolientos las primeras semanas de vida, es posible que deba despertar al beb para alimentarlo. Los horarios de lactancia varan de un beb a otro. Sin embargo, las siguientes reglas pueden servir como gua para ayudarla a garantizar que el beb se alimenta adecuadamente:  Se puede amamantar a los recin nacidos (bebs de 4 semanas o menos de vida) cada 1 a 3 horas.  No deben transcurrir ms de 3 horas durante el da o 5 horas durante la noche sin que se amamante a los recin nacidos.  Debe amamantar al beb 8 veces como mnimo en un perodo de 24 horas, hasta que comience a introducir slidos en su dieta, a los 6 meses de vida aproximadamente. EXTRACCIN DE LECHE MATERNA La extraccin y el almacenamiento de la leche materna le permiten asegurarse de que el beb se alimente exclusivamente de leche materna, aun en momentos en los que no puede amamantar. Esto tiene especial importancia si debe regresar al trabajo en el perodo en que an est amamantando o si no puede estar presente en los momentos en que el beb debe alimentarse. Su asesor en lactancia puede orientarla sobre cunto tiempo es seguro almacenar leche materna. El sacaleche es un aparato que le permite extraer leche de la mama a un recipiente estril. Luego, la leche materna extrada puede almacenarse en un refrigerador o congelador. Algunos sacaleches son manuales, mientras que otros son elctricos. Consulte a su asesor en lactancia qu tipo ser ms conveniente para usted. Los sacaleches se pueden comprar; sin embargo, algunos hospitales y grupos de apoyo a la lactancia materna alquilan sacaleches mensualmente. Un asesor en lactancia puede ensearle cmo extraer leche materna manualmente, en caso de que prefiera no usar un sacaleche. CMO CUIDAR LAS MAMAS DURANTE LA LACTANCIA MATERNA Los pezones se secan, agrietan y duelen durante la lactancia materna. Las siguientes  recomendaciones pueden ayudarla a mantener las mamas humectadas y sanas:  Evite usar jabn en los pezones.  Use un sostn de soporte. Aunque no son esenciales, las camisetas sin mangas o los sostenes especiales para amamantar estn diseados para acceder fcilmente a las mamas, para amamantar sin tener que quitarse todo el sostn o la camiseta. Evite usar sostenes con aro o sostenes muy ajustados.  Seque al aire sus pezones durante 3 a 4minutos despus de amamantar al beb.  Utilice solo apsitos de algodn en el sostn para absorber las prdidas de leche. La prdida de un poco de leche materna entre las tomas es normal.  Utilice lanolina sobre los pezones luego de amamantar. La lanolina ayuda a mantener la humedad normal de la piel. Si usa lanolina pura, no tiene que lavarse los pezones antes de volver a alimentar al beb. La lanolina pura no es txica para el beb. Adems, puede extraer manualmente algunas gotas de leche materna y masajear suavemente esa leche sobre los pezones, para que la leche se seque al aire. Durante las primeras semanas despus de dar a luz, algunas mujeres pueden experimentar hinchazn en las mamas (congestin mamaria). La congestin puede hacer que sienta las mamas pesadas, calientes y sensibles al tacto. El pico de la congestin ocurre dentro de los 3 a 5 das despus del parto. Las siguientes recomendaciones pueden ayudarla a aliviar la congestin:  Vace por completo las mamas al amamantar o extraer leche. Puede aplicar calor hmedo en las mamas (  en la ducha o con toallas hmedas para manos) antes de amamantar o extraer leche. Esto aumenta la circulacin y ayuda a que la leche fluya. Si el beb no vaca por completo las mamas cuando lo amamanta, extraiga la leche restante despus de que haya finalizado.  Use un sostn ajustado (para amamantar o comn) o una camiseta sin mangas durante 1 o 2 das para indicar al cuerpo que disminuya ligeramente la produccin de  leche.  Aplique compresas de hielo sobre las mamas, a menos que le resulte demasiado incmodo.  Asegrese de que el beb est prendido y se encuentre en la posicin correcta mientras lo alimenta. Si la congestin persiste luego de 48 horas o despus de seguir estas recomendaciones, comunquese con su mdico o un asesor en lactancia. RECOMENDACIONES GENERALES PARA EL CUIDADO DE LA SALUD DURANTE LA LACTANCIA MATERNA  Consuma alimentos saludables. Alterne comidas y colaciones, y coma 3 de cada una por da. Dado que lo que come afecta la leche materna, es posible que algunas comidas hagan que su beb se vuelva ms irritable de lo habitual. Evite comer este tipo de alimentos si percibe que afectan de manera negativa al beb.  Beba leche, jugos de fruta y agua para satisfacer su sed (aproximadamente 10 vasos al da).  Descanse con frecuencia, reljese y tome sus vitaminas prenatales para evitar la fatiga, el estrs y la anemia.  Contine con los autocontroles de la mama.  Evite masticar y fumar tabaco. Las sustancias qumicas de los cigarrillos que pasan a la leche materna y la exposicin al humo ambiental del tabaco pueden daar al beb.  No consuma alcohol ni drogas, incluida la marihuana. Algunos medicamentos, que pueden ser perjudiciales para el beb, pueden pasar a travs de la leche materna. Es importante que consulte a su mdico antes de tomar cualquier medicamento, incluidos todos los medicamentos recetados y de venta libre, as como los suplementos vitamnicos y herbales. Puede quedar embarazada durante la lactancia. Si desea controlar la natalidad, consulte a su mdico cules son las opciones ms seguras para el beb. SOLICITE ATENCIN MDICA SI:  Usted siente que quiere dejar de amamantar o se siente frustrada con la lactancia.  Siente dolor en las mamas o en los pezones.  Sus pezones estn agrietados o sangran.  Sus pechos estn irritados, sensibles o calientes.  Tiene un rea  hinchada en cualquiera de las mamas.  Siente escalofros o fiebre.  Tiene nuseas o vmitos.  Presenta una secrecin de otro lquido distinto de la leche materna de los pezones.  Sus mamas no se llenan antes de amamantar al beb para el quinto da despus del parto.  Se siente triste y deprimida.  El beb est demasiado somnoliento como para comer bien.  El beb tiene problemas para dormir.  Moja menos de 3 paales en 24 horas.  Defeca menos de 3 veces en 24 horas.  La piel del beb o la parte blanca de los ojos se vuelven amarillentas.  El beb no ha aumentado de peso a los 5 das de vida.  SOLICITE ATENCIN MDICA DE INMEDIATO SI:  El beb est muy cansado (letargo) y no se quiere despertar para comer.  Le sube la fiebre sin causa.  Esta informacin no tiene como fin reemplazar el consejo del mdico. Asegrese de hacerle al mdico cualquier pregunta que tenga. Document Released: 08/04/2005 Document Revised: 11/26/2015 Document Reviewed: 01/26/2013 Elsevier Interactive Patient Education  2017 [employer]  

## 2016-10-17 ENCOUNTER — Ambulatory Visit (INDEPENDENT_AMBULATORY_CARE_PROVIDER_SITE_OTHER): Payer: Self-pay | Admitting: *Deleted

## 2016-10-17 VITALS — BP 108/66 | Wt 195.0 lb

## 2016-10-17 DIAGNOSIS — Z3689 Encounter for other specified antenatal screening: Secondary | ICD-10-CM

## 2016-10-17 DIAGNOSIS — O09523 Supervision of elderly multigravida, third trimester: Secondary | ICD-10-CM

## 2016-10-17 NOTE — Progress Notes (Signed)
3/2 NST reviewed and reactive 

## 2016-10-21 ENCOUNTER — Ambulatory Visit (INDEPENDENT_AMBULATORY_CARE_PROVIDER_SITE_OTHER): Payer: Self-pay | Admitting: Obstetrics and Gynecology

## 2016-10-21 ENCOUNTER — Other Ambulatory Visit: Payer: Self-pay | Admitting: Obstetrics and Gynecology

## 2016-10-21 ENCOUNTER — Encounter: Payer: Self-pay | Admitting: Obstetrics and Gynecology

## 2016-10-21 VITALS — BP 109/63 | HR 94 | Wt 195.0 lb

## 2016-10-21 DIAGNOSIS — O09519 Supervision of elderly primigravida, unspecified trimester: Secondary | ICD-10-CM

## 2016-10-21 DIAGNOSIS — O09523 Supervision of elderly multigravida, third trimester: Secondary | ICD-10-CM

## 2016-10-21 DIAGNOSIS — O99820 Streptococcus B carrier state complicating pregnancy: Secondary | ICD-10-CM

## 2016-10-21 DIAGNOSIS — Z789 Other specified health status: Secondary | ICD-10-CM

## 2016-10-21 DIAGNOSIS — O0993 Supervision of high risk pregnancy, unspecified, third trimester: Secondary | ICD-10-CM

## 2016-10-21 NOTE — H&P (Addendum)
Obstetrics Admission History & Physical  10/21/2016 - 10:52 AM [facility]  Chief Complaint: IOL for AMA  History of Present Illness  41 y.o. [id number] @ 5331w2d, with the above CC. Pregnancy complicated by: AMA, GBS pos.  Barbara Coffey states that she has no labor s/s and no decreased FM  Review of Systems: as noted in the History of Present Illness.  PMHx:  Past Medical History:  Diagnosis Date  . No pertinent past medical history    PSHx:  Past Surgical History:  Procedure Laterality Date  . left ovarian cyst     Medications: PNV  Allergies: has No Known Allergies. OBHx:  OB History  Gravida Para Term Preterm AB Living  7 4 4  0 2 4  SAB TAB Ectopic Multiple Live Births  2 0 0 0 4    # Outcome Date GA Lbr Len/2nd Weight Sex Delivery Anes PTL Lv  7 Current           6 Term 02/13/13 637w3d 12:00 / 00:16 8 lb 2.9 oz (3.71 kg) F Vag-Spont EPI  LIV  5 SAB  2174w0d            Birth Comments: System Generated. Please review and update pregnancy details.  4 Term  4884w0d  8 lb 10 oz (3.912 kg) M Vag-Spont   LIV  3 Term  7984w0d  7 lb 15 oz (3.6 kg) F Vag-Spont  Y LIV  2 Term  7984w0d  7 lb 8 oz (3.402 kg) F Vag-Spont   LIV  1 SAB  5954w0d                      FHx:  Family History  Problem Relation Age of Onset  . Hypertension Father    Soc Hx:  Social History   Social History  . Marital status: Married    Spouse name: N/A  . Number of children: N/A  . Years of education: N/A   Occupational History  . Not on file.   Social History Main Topics  . Smoking status: Never Smoker  . Smokeless tobacco: Not on file  . Alcohol use No  . Drug use: No  . Sexual activity: Yes    Birth control/ protection: None   Other Topics Concern  . Not on file   Social History Narrative  . No narrative on file    Objective   Vitals:   10/21/16 0901  BP: 109/63  Pulse: 94   EFM: 145 baseline, +accels, no decel, mod var x 3467m   Toco: one UC  General: Well nourished, well developed female in no acute distress.  Skin:  Warm and dry.  Cardiovascular: S1, S2 normal, no murmur, rub or gallop, regular rate and rhythm Respiratory:  Clear to auscultation bilateral. Normal respiratory effort Abdomen: gravid, nttp Neuro/Psych:  Normal mood and affect.   SVE: 1.5/50/-2/anterior/intermediate Leopolds/EFW: cephalic, 3500  Labs  No new labs.   Radiology 2/1: 62% 2373gm, AC normal, AFI normal, cephalic. Previa resolved  Perinatal info  B pos/ Rubella  Immune / Varicella Unknown/RPR neg/HIV negative/HepB Surf Ag negative/TDaP:UTD /pap 2017 neg and HPV neg/  Assessment & Plan   41 y.o. [id number] @ 6331w2d doing well *Pregnancy: routine care. BTL paid for *AMA: continue with 2x/week testing. Growth scan normal. Set up for IOL 3/18. Recheck on admission: cytotec vs pitocin IOL *GBS: pos. tx in labor.   RTC: 3/9  Barbara Copaharlie Karesha Coffey, Jr.  MD Attending [facility] [employer])

## 2016-10-24 ENCOUNTER — Ambulatory Visit (INDEPENDENT_AMBULATORY_CARE_PROVIDER_SITE_OTHER): Payer: Self-pay | Admitting: *Deleted

## 2016-10-24 VITALS — BP 116/68 | HR 72 | Wt 195.0 lb

## 2016-10-24 DIAGNOSIS — O09523 Supervision of elderly multigravida, third trimester: Secondary | ICD-10-CM

## 2016-10-24 DIAGNOSIS — Z3689 Encounter for other specified antenatal screening: Secondary | ICD-10-CM

## 2016-10-24 NOTE — Progress Notes (Signed)
NST performed today was reviewed and was found to be reactive.  AFI was also normal.  Continue recommended antenatal testing and prenatal care. 

## 2016-10-28 ENCOUNTER — Encounter: Payer: Self-pay | Admitting: *Deleted

## 2016-10-28 ENCOUNTER — Ambulatory Visit (INDEPENDENT_AMBULATORY_CARE_PROVIDER_SITE_OTHER): Payer: Self-pay | Admitting: Family Medicine

## 2016-10-28 ENCOUNTER — Encounter: Payer: Self-pay | Admitting: Family Medicine

## 2016-10-28 ENCOUNTER — Encounter (HOSPITAL_COMMUNITY): Payer: Self-pay | Admitting: *Deleted

## 2016-10-28 ENCOUNTER — Telehealth (HOSPITAL_COMMUNITY): Payer: Self-pay | Admitting: *Deleted

## 2016-10-28 VITALS — BP 113/60 | HR 94 | Wt 199.0 lb

## 2016-10-28 DIAGNOSIS — O99820 Streptococcus B carrier state complicating pregnancy: Secondary | ICD-10-CM

## 2016-10-28 DIAGNOSIS — O09523 Supervision of elderly multigravida, third trimester: Secondary | ICD-10-CM

## 2016-10-28 DIAGNOSIS — O09519 Supervision of elderly primigravida, unspecified trimester: Secondary | ICD-10-CM

## 2016-10-28 DIAGNOSIS — O0993 Supervision of high risk pregnancy, unspecified, third trimester: Secondary | ICD-10-CM

## 2016-10-28 NOTE — Progress Notes (Signed)
    PRENATAL VISIT NOTE  Subjective:  Barbara Coffey is a 41 y.o. [id number] at 1071w2d being seen today for ongoing prenatal care.  She is currently monitored for the following issues for this high-risk pregnancy and has Supervision of high-risk pregnancy; Advanced maternal age, primigravida, antepartum; Language barrier affecting health care; and Group B Streptococcus carrier, +RV culture, currently pregnant on her problem list.  Patient reports no complaints.  Contractions: Not present. Vag. Bleeding: None.  Movement: Present. Denies leaking of fluid.   The following portions of the patient's history were reviewed and updated as appropriate: allergies, current medications, past family history, past medical history, past social history, past surgical history and problem list. Problem list updated.  Objective:   Vitals:   10/28/16 1311  BP: 113/60  Pulse: 94  Weight: 199 lb (90.3 kg)    Fetal Status: Fetal Heart Rate (bpm): 151   Movement: Present  Presentation: Vertex  General:  Alert, oriented and cooperative. Patient is in no acute distress.  Skin: Skin is warm and dry. No rash noted.   Cardiovascular: Normal heart rate noted  Respiratory: Normal respiratory effort, no problems with respiration noted  Abdomen: Soft, gravid, appropriate for gestational age. Pain/Pressure: Present     Pelvic:  Cervical exam deferred Dilation: 2 Effacement (%): 50 Station: -2  Extremities: Normal range of motion.  Edema: Trace  Mental Status: Normal mood and affect. Normal behavior. Normal judgment and thought content.  NST reviewed and reactive. Assessment and Plan:  Pregnancy: [id number] at 4571w2d  1. Elderly multigravida in third trimester Continue 2x/wk testing U/s 1 month ago, was 62% IOL scheduled at 40 wks - Fetal nonstress test  2. Supervision of high risk pregnancy in third trimester Continue prenatal care.  3. Group B Streptococcus carrier, +RV culture, currently pregnant Needs  treatment in labor  Preterm labor symptoms and general obstetric precautions including but not limited to vaginal bleeding, contractions, leaking of fluid and fetal movement were reviewed in detail with the patient. Please refer to After Visit Summary for other counseling recommendations.  Return in 6 weeks (on 12/09/2016).   Reva Boresanya S Alexus Michael, MD

## 2016-10-28 NOTE — Telephone Encounter (Signed)
[phone] interpreter number Preadmission screen

## 2016-10-28 NOTE — Patient Instructions (Signed)
Tercer trimestre de embarazo (Third Trimester of Pregnancy) El tercer trimestre comprende desde la semana29 hasta la semana42, es decir, desde el mes7 hasta el mes9. El tercer trimestre es un perodo en el que el feto crece rpidamente. Hacia el final del noveno mes, el feto mide alrededor de 20pulgadas (45cm) de largo y pesa entre 6 y 10 libras (2,700 y 4,500kg). CAMBIOS EN EL ORGANISMO Su organismo atraviesa por muchos cambios durante el embarazo, y estos varan de una mujer a otra.  Seguir aumentando de peso. Es de esperar que aumente entre 25 y 35libras (11 y 16kg) hacia el final del embarazo.  Podrn aparecer las primeras estras en las caderas, el abdomen y las mamas.  Puede tener necesidad de orinar con ms frecuencia porque el feto baja hacia la pelvis y ejerce presin sobre la vejiga.  Debido al embarazo podr sentir acidez estomacal con frecuencia.  Puede estar estreida, ya que ciertas hormonas enlentecen los movimientos de los msculos que empujan los desechos a travs de los intestinos.  Pueden aparecer hemorroides o abultarse e hincharse las venas (venas varicosas).  Puede sentir dolor plvico debido al aumento de peso y a que las hormonas del embarazo relajan las articulaciones entre los huesos de la pelvis. El dolor de espalda puede ser consecuencia de la sobrecarga de los msculos que soportan la postura.  Tal vez haya cambios en el cabello que pueden incluir su engrosamiento, crecimiento rpido y cambios en la textura. Adems, a algunas mujeres se les cae el cabello durante o despus del embarazo, o tienen el cabello seco o fino. Lo ms probable es que el cabello se le normalice despus del nacimiento del beb.  Las mamas seguirn creciendo y le dolern. A veces, puede haber una secrecin amarilla de las mamas llamada calostro.  El ombligo puede salir hacia afuera.  Puede sentir que le falta el aire debido a que se expande el tero.  Puede notar que el feto  "baja" o lo siente ms bajo, en el abdomen.  Puede tener una prdida de secrecin mucosa con sangre. Esto suele ocurrir en el trmino de unos pocos das a una semana antes de que comience el trabajo de parto.  El cuello del tero se vuelve delgado y blando (se borra) cerca de la fecha de parto. QU DEBE ESPERAR EN LOS EXMENES PRENATALES Le harn exmenes prenatales cada 2semanas hasta la semana36. A partir de ese momento le harn exmenes semanales. Durante una visita prenatal de rutina:  La pesarn para asegurarse de que usted y el feto estn creciendo normalmente.  Le tomarn la presin arterial.  Le medirn el abdomen para controlar el desarrollo del beb.  Se escucharn los latidos cardacos fetales.  Se evaluarn los resultados de los estudios solicitados en visitas anteriores.  Le revisarn el cuello del tero cuando est prxima la fecha de parto para controlar si este se ha borrado. Alrededor de la semana36, el mdico le revisar el cuello del tero. Al mismo tiempo, realizar un anlisis de las secreciones del tejido vaginal. Este examen es para determinar si hay un tipo de bacteria, estreptococo Grupo B. El mdico le explicar esto con ms detalle. El mdico puede preguntarle lo siguiente:  Cmo le gustara que fuera el parto.  Cmo se siente.  Si siente los movimientos del beb.  Si ha tenido sntomas anormales, como prdida de lquido, sangrado, dolores de cabeza intensos o clicos abdominales.  Si est consumiendo algn producto que contenga tabaco, como cigarrillos, tabaco de mascar y   cigarrillos electrnicos.  Si tiene alguna pregunta. Otros exmenes o estudios de deteccin que pueden realizarse durante el tercer trimestre incluyen lo siguiente:  Anlisis de sangre para controlar los niveles de hierro (anemia).  Controles fetales para determinar su salud, nivel de actividad y crecimiento. Si tiene alguna enfermedad o hay problemas durante el embarazo, le harn  estudios.  Prueba del VIH (virus de inmunodeficiencia humana). Si corre un riesgo alto, pueden realizarle una prueba de deteccin del VIH durante el tercer trimestre del embarazo. FALSO TRABAJO DE PARTO Es posible que sienta contracciones leves e irregulares que finalmente desaparecen. Se llaman contracciones de Braxton Hicks o falso trabajo de parto. Las contracciones pueden durar horas, das o incluso semanas, antes de que el verdadero trabajo de parto se inicie. Si las contracciones ocurren a intervalos regulares, se intensifican o se hacen dolorosas, lo mejor es que la revise el mdico. SIGNOS DE TRABAJO DE PARTO  Clicos de tipo menstrual.  Contracciones cada 5minutos o menos.  Contracciones que comienzan en la parte superior del tero y se extienden hacia abajo, a la zona inferior del abdomen y la espalda.  Sensacin de mayor presin en la pelvis o dolor de espalda.  Una secrecin de mucosidad acuosa o con sangre que sale de la vagina. Si tiene alguno de estos signos antes de la semana37 del embarazo, llame a su mdico de inmediato. Debe concurrir al hospital para que la controlen inmediatamente. INSTRUCCIONES PARA EL CUIDADO EN EL HOGAR  Evite fumar, consumir hierbas, beber alcohol y tomar frmacos que no le hayan recetado. Estas sustancias qumicas afectan la formacin y el desarrollo del beb.  No consuma ningn producto que contenga tabaco, lo que incluye cigarrillos, tabaco de mascar y cigarrillos electrnicos. Si necesita ayuda para dejar de fumar, consulte al mdico. Puede recibir asesoramiento y otro tipo de recursos para dejar de fumar.  Siga las indicaciones del mdico en relacin con el uso de medicamentos. Durante el embarazo, hay medicamentos que son seguros de tomar y otros que no.  Haga ejercicio solamente como se lo haya indicado el mdico. Sentir clicos uterinos es un buen signo para detener la actividad fsica.  Contine comiendo alimentos sanos con  regularidad.  Use un sostn que le brinde buen soporte si le duelen las mamas.  No se d baos de inmersin en agua caliente, baos turcos ni saunas.  Use el cinturn de seguridad en todo momento mientras conduce.  No coma carne cruda ni queso sin cocinar; evite el contacto con las bandejas sanitarias de los gatos y la tierra que estos animales usan. Estos elementos contienen grmenes que pueden causar defectos congnitos en el beb.  Tome las vitaminas prenatales.  Tome entre 1500 y 2000mg de calcio diariamente comenzando en la semana20 del embarazo hasta el parto.  Si est estreida, pruebe un laxante suave (si el mdico lo autoriza). Consuma ms alimentos ricos en fibra, como vegetales y frutas frescos y cereales integrales. Beba gran cantidad de lquido para mantener la orina de tono claro o color amarillo plido.  Dese baos de asiento con agua tibia para aliviar el dolor o las molestias causadas por las hemorroides. Use una crema para las hemorroides si el mdico la autoriza.  Si tiene venas varicosas, use medias de descanso. Eleve los pies durante 15minutos, 3 o 4veces por da. Limite el consumo de sal en su dieta.  Evite levantar objetos pesados, use zapatos de tacones bajos y mantenga una buena postura.  Descanse con las piernas elevadas si tiene   calambres o dolor de cintura.  Visite a su dentista si no lo ha hecho durante el embarazo. Use un cepillo de dientes blando para higienizarse los dientes y psese el hilo dental con suavidad.  Puede seguir manteniendo relaciones sexuales, a menos que el mdico le indique lo contrario.  No haga viajes largos excepto que sea absolutamente necesario y solo con la autorizacin del mdico.  Tome clases prenatales para entender, practicar y hacer preguntas sobre el trabajo de parto y el parto.  Haga un ensayo de la partida al hospital.  Prepare el bolso que llevar al hospital.  Prepare la habitacin del beb.  Concurra a todas  las visitas prenatales segn las indicaciones de su mdico.  SOLICITE ATENCIN MDICA SI:  No est segura de que est en trabajo de parto o de que ha roto la bolsa de las aguas.  Tiene mareos.  Siente clicos leves, presin en la pelvis o dolor persistente en el abdomen.  Tiene nuseas, vmitos o diarrea persistentes.  Observa una secrecin vaginal con mal olor.  Siente dolor al orinar.  SOLICITE ATENCIN MDICA DE INMEDIATO SI:  Tiene fiebre.  Tiene una prdida de lquido por la vagina.  Tiene sangrado o pequeas prdidas vaginales.  Siente dolor intenso o clicos en el abdomen.  Sube o baja de peso rpidamente.  Tiene dificultad para respirar y siente dolor de pecho.  Sbitamente se le hinchan mucho el rostro, las manos, los tobillos, los pies o las piernas.  No ha sentido los movimientos del beb durante una hora.  Siente un dolor de cabeza intenso que no se alivia con medicamentos.  Su visin se modifica.  Esta informacin no tiene como fin reemplazar el consejo del mdico. Asegrese de hacerle al mdico cualquier pregunta que tenga. Document Released: 05/14/2005 Document Revised: 08/25/2014 Document Reviewed: 10/05/2012 Elsevier Interactive Patient Education  2017 [employer]   Lactancia materna (Breastfeeding) Decidir amamantar es una de las mejores elecciones que puede hacer por usted y su beb. El cambio hormonal durante el embarazo produce el desarrollo del tejido mamario y aumenta la cantidad y el tamao de los conductos galactforos. Estas hormonas tambin permiten que las protenas, los azcares y las grasas de la sangre produzcan la leche materna en las glndulas productoras de leche. Las hormonas impiden que la leche materna sea liberada antes del nacimiento del beb, adems de impulsar el flujo de leche luego del nacimiento. Una vez que ha comenzado a amamantar, pensar en el beb, as como la succin o el llanto, pueden estimular la liberacin de leche  de las glndulas productoras de leche. LOS BENEFICIOS DE AMAMANTAR Para el beb  La primera leche (calostro) ayuda a mejorar el funcionamiento del sistema digestivo del beb.  La leche tiene anticuerpos que ayudan a prevenir las infecciones en el beb.  El beb tiene una menor incidencia de asma, alergias y del sndrome de muerte sbita del lactante.  Los nutrientes en la leche materna son mejores para el beb que la leche maternizada y estn preparados exclusivamente para cubrir las necesidades del beb.  La leche materna mejora el desarrollo cerebral del beb.  Es menos probable que el beb desarrolle otras enfermedades, como obesidad infantil, asma o diabetes mellitus de tipo 2. Para usted  La lactancia materna favorece el desarrollo de un vnculo muy especial entre la madre y el beb.  Es conveniente. La leche materna siempre est disponible a la temperatura correcta y es econmica.  La lactancia materna ayuda a quemar caloras y   a perder el peso ganado durante el embarazo.  Favorece la contraccin del tero al tamao que tena antes del embarazo de manera ms rpida y disminuye el sangrado (loquios) despus del parto.  La lactancia materna contribuye a reducir el riesgo de desarrollar diabetes mellitus de tipo 2, osteoporosis o cncer de mama o de ovario en el futuro. SIGNOS DE QUE EL BEB EST HAMBRIENTO Primeros signos de hambre  Aumenta su estado de alerta o actividad.  Se estira.  Mueve la cabeza de un lado a otro.  Mueve la cabeza y abre la boca cuando se le toca la mejilla o la comisura de la boca (reflejo de bsqueda).  Aumenta las vocalizaciones, tales como sonidos de succin, se relame los labios, emite arrullos, suspiros, o chirridos.  Mueve la mano hacia la boca.  Se chupa con ganas los dedos o las manos. Signos tardos de hambre  Est agitado.  Llora de manera intermitente. Signos de hambre extrema Los signos de hambre extrema requerirn que lo calme y  lo consuele antes de que el beb pueda alimentarse adecuadamente. No espere a que se manifiesten los siguientes signos de hambre extrema para comenzar a amamantar:  Agitacin.  Llanto intenso y fuerte.  Gritos. INFORMACIN BSICA SOBRE LA LACTANCIA MATERNA Iniciacin de la lactancia materna  Encuentre un lugar cmodo para sentarse o acostarse, con un buen respaldo para el cuello y la espalda.  Coloque una almohada o una manta enrollada debajo del beb para acomodarlo a la altura de la mama (si est sentada). Las almohadas para amamantar se han diseado especialmente a fin de servir de apoyo para los brazos y el beb mientras amamanta.  Asegrese de que el abdomen del beb est frente al suyo.  Masajee suavemente la mama. Con las yemas de los dedos, masajee la pared del pecho hacia el pezn en un movimiento circular. Esto estimula el flujo de leche. Es posible que deba continuar este movimiento mientras amamanta si la leche fluye lentamente.  Sostenga la mama con el pulgar por arriba del pezn y los otros 4 dedos por debajo de la mama. Asegrese de que los dedos se encuentren lejos del pezn y de la boca del beb.  Empuje suavemente los labios del beb con el pezn o con el dedo.  Cuando la boca del beb se abra lo suficiente, acrquelo rpidamente a la mama e introduzca todo el pezn y la zona oscura que lo rodea (areola), tanto como sea posible, dentro de la boca del beb. ? Debe haber ms areola visible por arriba del labio superior del beb que por debajo del labio inferior. ? La lengua del beb debe estar entre la enca inferior y la mama.  Asegrese de que la boca del beb est en la posicin correcta alrededor del pezn (prendida). Los labios del beb deben crear un sello sobre la mama y estar doblados hacia afuera (invertidos).  Es comn que el beb succione durante 2 a 3 minutos para que comience el flujo de leche materna. Cmo debe prenderse Es muy importante que le ensee al  beb cmo prenderse adecuadamente a la mama. Si el beb no se prende adecuadamente, puede causarle dolor en el pezn y reducir la produccin de leche materna, y hacer que el beb tenga un escaso aumento de peso. Adems, si el beb no se prende adecuadamente al pezn, puede tragar aire durante la alimentacin. Esto puede causarle molestias al beb. Hacer eructar al beb al cambiar de mama puede ayudarlo a liberar el   aire. Sin embargo, ensearle al beb cmo prenderse a la mama adecuadamente es la mejor manera de evitar que se sienta molesto por tragar aire mientras se alimenta. Signos de que el beb se ha prendido adecuadamente al pezn:  Tironea o succiona de modo silencioso, sin causarle dolor.  Se escucha que traga cada 3 o 4 succiones.  Hay movimientos musculares por arriba y por delante de sus odos al succionar. Signos de que el beb no se ha prendido adecuadamente al pezn:  Hace ruidos de succin o de chasquido mientras se alimenta.  Siente dolor en el pezn. Si cree que el beb no se prendi correctamente, deslice el dedo en la comisura de la boca y colquelo entre las encas del beb para interrumpir la succin. Intente comenzar a amamantar nuevamente. Signos de lactancia materna exitosa Signos del beb:  Disminuye gradualmente el nmero de succiones o cesa la succin por completo.  Se duerme.  Relaja el cuerpo.  Retiene una pequea cantidad de leche en la boca.  Se desprende solo del pecho. Signos que presenta usted:  Las mamas han aumentado la firmeza, el peso y el tamao 1 a 3 horas despus de amamantar.  Estn ms blandas inmediatamente despus de amamantar.  Un aumento del volumen de leche, y tambin un cambio en su consistencia y color se producen hacia el quinto da de lactancia materna.  Los pezones no duelen, ni estn agrietados ni sangran. Signos de que su beb recibe la cantidad de leche suficiente  Mojar por lo menos 1 o 2 paales durante las primeras 24  horas despus del nacimiento.  Mojar por lo menos 5 o 6 paales cada 24 horas durante la primera semana despus del nacimiento. La orina debe ser transparente o de color amarillo plido a los 5 das despus del nacimiento.  Mojar entre 6 y 8 paales cada 24 horas a medida que el beb sigue creciendo y desarrollndose.  Defeca al menos 3 veces en 24 horas a los 5 das de vida. La materia fecal debe ser blanda y amarillenta.  Defeca al menos 3 veces en 24 horas a los 7 das de vida. La materia fecal debe ser grumosa y amarillenta.  No registra una prdida de peso mayor del 10% del peso al nacer durante los primeros 3 das de vida.  Aumenta de peso un promedio de 4 a 7onzas (113 a 198g) por semana despus de los 4 das de vida.  Aumenta de peso, diariamente, de manera uniforme a partir de los 5 das de vida, sin registrar prdida de peso despus de las 2semanas de vida. Despus de alimentarse, es posible que el beb regurgite una pequea cantidad. Esto es frecuente. FRECUENCIA Y DURACIN DE LA LACTANCIA MATERNA El amamantamiento frecuente la ayudar a producir ms leche y a prevenir problemas de dolor en los pezones e hinchazn en las mamas. Alimente al beb cuando muestre signos de hambre o si siente la necesidad de reducir la congestin de las mamas. Esto se denomina "lactancia a demanda". Evite el uso del chupete mientras trabaja para establecer la lactancia (las primeras 4 a 6 semanas despus del nacimiento del beb). Despus de este perodo, podr ofrecerle un chupete. Las investigaciones demostraron que el uso del chupete durante el primer ao de vida del beb disminuye el riesgo de desarrollar el sndrome de muerte sbita del lactante (SMSL). Permita que el nio se alimente en cada mama todo lo que desee. Contine amamantando al beb hasta que haya terminado de alimentarse. Cuando   el beb se desprende o se queda dormido mientras se est alimentando de la primera mama, ofrzcale la segunda.  Debido a que, con frecuencia, los recin nacidos permanecen somnolientos las primeras semanas de vida, es posible que deba despertar al beb para alimentarlo. Los horarios de lactancia varan de un beb a otro. Sin embargo, las siguientes reglas pueden servir como gua para ayudarla a garantizar que el beb se alimenta adecuadamente:  Se puede amamantar a los recin nacidos (bebs de 4 semanas o menos de vida) cada 1 a 3 horas.  No deben transcurrir ms de 3 horas durante el da o 5 horas durante la noche sin que se amamante a los recin nacidos.  Debe amamantar al beb 8 veces como mnimo en un perodo de 24 horas, hasta que comience a introducir slidos en su dieta, a los 6 meses de vida aproximadamente. EXTRACCIN DE LECHE MATERNA La extraccin y el almacenamiento de la leche materna le permiten asegurarse de que el beb se alimente exclusivamente de leche materna, aun en momentos en los que no puede amamantar. Esto tiene especial importancia si debe regresar al trabajo en el perodo en que an est amamantando o si no puede estar presente en los momentos en que el beb debe alimentarse. Su asesor en lactancia puede orientarla sobre cunto tiempo es seguro almacenar leche materna. El sacaleche es un aparato que le permite extraer leche de la mama a un recipiente estril. Luego, la leche materna extrada puede almacenarse en un refrigerador o congelador. Algunos sacaleches son manuales, mientras que otros son elctricos. Consulte a su asesor en lactancia qu tipo ser ms conveniente para usted. Los sacaleches se pueden comprar; sin embargo, algunos hospitales y grupos de apoyo a la lactancia materna alquilan sacaleches mensualmente. Un asesor en lactancia puede ensearle cmo extraer leche materna manualmente, en caso de que prefiera no usar un sacaleche. CMO CUIDAR LAS MAMAS DURANTE LA LACTANCIA MATERNA Los pezones se secan, agrietan y duelen durante la lactancia materna. Las siguientes  recomendaciones pueden ayudarla a mantener las mamas humectadas y sanas:  Evite usar jabn en los pezones.  Use un sostn de soporte. Aunque no son esenciales, las camisetas sin mangas o los sostenes especiales para amamantar estn diseados para acceder fcilmente a las mamas, para amamantar sin tener que quitarse todo el sostn o la camiseta. Evite usar sostenes con aro o sostenes muy ajustados.  Seque al aire sus pezones durante 3 a 4minutos despus de amamantar al beb.  Utilice solo apsitos de algodn en el sostn para absorber las prdidas de leche. La prdida de un poco de leche materna entre las tomas es normal.  Utilice lanolina sobre los pezones luego de amamantar. La lanolina ayuda a mantener la humedad normal de la piel. Si usa lanolina pura, no tiene que lavarse los pezones antes de volver a alimentar al beb. La lanolina pura no es txica para el beb. Adems, puede extraer manualmente algunas gotas de leche materna y masajear suavemente esa leche sobre los pezones, para que la leche se seque al aire. Durante las primeras semanas despus de dar a luz, algunas mujeres pueden experimentar hinchazn en las mamas (congestin mamaria). La congestin puede hacer que sienta las mamas pesadas, calientes y sensibles al tacto. El pico de la congestin ocurre dentro de los 3 a 5 das despus del parto. Las siguientes recomendaciones pueden ayudarla a aliviar la congestin:  Vace por completo las mamas al amamantar o extraer leche. Puede aplicar calor hmedo en las mamas (  en la ducha o con toallas hmedas para manos) antes de amamantar o extraer leche. Esto aumenta la circulacin y ayuda a que la leche fluya. Si el beb no vaca por completo las mamas cuando lo amamanta, extraiga la leche restante despus de que haya finalizado.  Use un sostn ajustado (para amamantar o comn) o una camiseta sin mangas durante 1 o 2 das para indicar al cuerpo que disminuya ligeramente la produccin de  leche.  Aplique compresas de hielo sobre las mamas, a menos que le resulte demasiado incmodo.  Asegrese de que el beb est prendido y se encuentre en la posicin correcta mientras lo alimenta. Si la congestin persiste luego de 48 horas o despus de seguir estas recomendaciones, comunquese con su mdico o un asesor en lactancia. RECOMENDACIONES GENERALES PARA EL CUIDADO DE LA SALUD DURANTE LA LACTANCIA MATERNA  Consuma alimentos saludables. Alterne comidas y colaciones, y coma 3 de cada una por da. Dado que lo que come afecta la leche materna, es posible que algunas comidas hagan que su beb se vuelva ms irritable de lo habitual. Evite comer este tipo de alimentos si percibe que afectan de manera negativa al beb.  Beba leche, jugos de fruta y agua para satisfacer su sed (aproximadamente 10 vasos al da).  Descanse con frecuencia, reljese y tome sus vitaminas prenatales para evitar la fatiga, el estrs y la anemia.  Contine con los autocontroles de la mama.  Evite masticar y fumar tabaco. Las sustancias qumicas de los cigarrillos que pasan a la leche materna y la exposicin al humo ambiental del tabaco pueden daar al beb.  No consuma alcohol ni drogas, incluida la marihuana. Algunos medicamentos, que pueden ser perjudiciales para el beb, pueden pasar a travs de la leche materna. Es importante que consulte a su mdico antes de tomar cualquier medicamento, incluidos todos los medicamentos recetados y de venta libre, as como los suplementos vitamnicos y herbales. Puede quedar embarazada durante la lactancia. Si desea controlar la natalidad, consulte a su mdico cules son las opciones ms seguras para el beb. SOLICITE ATENCIN MDICA SI:  Usted siente que quiere dejar de amamantar o se siente frustrada con la lactancia.  Siente dolor en las mamas o en los pezones.  Sus pezones estn agrietados o sangran.  Sus pechos estn irritados, sensibles o calientes.  Tiene un rea  hinchada en cualquiera de las mamas.  Siente escalofros o fiebre.  Tiene nuseas o vmitos.  Presenta una secrecin de otro lquido distinto de la leche materna de los pezones.  Sus mamas no se llenan antes de amamantar al beb para el quinto da despus del parto.  Se siente triste y deprimida.  El beb est demasiado somnoliento como para comer bien.  El beb tiene problemas para dormir.  Moja menos de 3 paales en 24 horas.  Defeca menos de 3 veces en 24 horas.  La piel del beb o la parte blanca de los ojos se vuelven amarillentas.  El beb no ha aumentado de peso a los 5 das de vida.  SOLICITE ATENCIN MDICA DE INMEDIATO SI:  El beb est muy cansado (letargo) y no se quiere despertar para comer.  Le sube la fiebre sin causa.  Esta informacin no tiene como fin reemplazar el consejo del mdico. Asegrese de hacerle al mdico cualquier pregunta que tenga. Document Released: 08/04/2005 Document Revised: 11/26/2015 Document Reviewed: 01/26/2013 Elsevier Interactive Patient Education  2017 [employer]  

## 2016-10-30 ENCOUNTER — Other Ambulatory Visit: Payer: Self-pay | Admitting: Obstetrics and Gynecology

## 2016-10-31 ENCOUNTER — Inpatient Hospital Stay (HOSPITAL_COMMUNITY): Payer: Self-pay | Admitting: Anesthesiology

## 2016-10-31 ENCOUNTER — Encounter (HOSPITAL_COMMUNITY): Payer: Self-pay

## 2016-10-31 ENCOUNTER — Ambulatory Visit: Payer: Self-pay | Admitting: *Deleted

## 2016-10-31 ENCOUNTER — Inpatient Hospital Stay (HOSPITAL_COMMUNITY)
Admission: AD | Admit: 2016-10-31 | Discharge: 2016-11-02 | DRG: 767 | Disposition: A | Discharge status: Home / Self Care | Payer: Self-pay | Source: Ambulatory Visit | Attending: Family Medicine | Admitting: Family Medicine

## 2016-10-31 VITALS — BP 117/67

## 2016-10-31 DIAGNOSIS — R030 Elevated blood-pressure reading, without diagnosis of hypertension: Secondary | ICD-10-CM | POA: Diagnosis present

## 2016-10-31 DIAGNOSIS — Z6837 Body mass index (BMI) 37.0-37.9, adult: Secondary | ICD-10-CM

## 2016-10-31 DIAGNOSIS — Z3A39 39 weeks gestation of pregnancy: Secondary | ICD-10-CM

## 2016-10-31 DIAGNOSIS — Z302 Encounter for sterilization: Secondary | ICD-10-CM

## 2016-10-31 DIAGNOSIS — O99824 Streptococcus B carrier state complicating childbirth: Secondary | ICD-10-CM | POA: Diagnosis present

## 2016-10-31 DIAGNOSIS — O26893 Other specified pregnancy related conditions, third trimester: Secondary | ICD-10-CM | POA: Diagnosis present

## 2016-10-31 DIAGNOSIS — O99820 Streptococcus B carrier state complicating pregnancy: Secondary | ICD-10-CM

## 2016-10-31 DIAGNOSIS — O163 Unspecified maternal hypertension, third trimester: Secondary | ICD-10-CM

## 2016-10-31 DIAGNOSIS — O133 Gestational [pregnancy-induced] hypertension without significant proteinuria, third trimester: Secondary | ICD-10-CM

## 2016-10-31 DIAGNOSIS — O0993 Supervision of high risk pregnancy, unspecified, third trimester: Secondary | ICD-10-CM

## 2016-10-31 DIAGNOSIS — O99214 Obesity complicating childbirth: Secondary | ICD-10-CM | POA: Diagnosis present

## 2016-10-31 LAB — COMPREHENSIVE METABOLIC PANEL
ALK PHOS: 101 U/L (ref 38–126)
ALT: 15 U/L (ref 14–54)
ANION GAP: 10 (ref 5–15)
AST: 16 U/L (ref 15–41)
Albumin: 3 g/dL — ABNORMAL LOW (ref 3.5–5.0)
BILIRUBIN TOTAL: 0.7 mg/dL (ref 0.3–1.2)
BUN: 8 mg/dL (ref 6–20)
CALCIUM: 8.6 mg/dL — AB (ref 8.9–10.3)
CO2: 20 mmol/L — ABNORMAL LOW (ref 22–32)
Chloride: 106 mmol/L (ref 101–111)
Creatinine, Ser: <0.3 mg/dL — ABNORMAL LOW (ref 0.44–1.00)
Glucose, Bld: 97 mg/dL (ref 65–99)
Potassium: 3.3 mmol/L — ABNORMAL LOW (ref 3.5–5.1)
Sodium: 136 mmol/L (ref 135–145)
TOTAL PROTEIN: 6.5 g/dL (ref 6.5–8.1)

## 2016-10-31 LAB — CBC
HCT: 34.7 % — ABNORMAL LOW (ref 36.0–46.0)
HEMOGLOBIN: 11.6 g/dL — AB (ref 12.0–15.0)
MCH: 28.9 pg (ref 26.0–34.0)
MCHC: 33.4 g/dL (ref 30.0–36.0)
MCV: 86.3 fL (ref 78.0–100.0)
Platelets: 231 10*3/uL (ref 150–400)
RBC: 4.02 MIL/uL (ref 3.87–5.11)
RDW: 15.6 % — ABNORMAL HIGH (ref 11.5–15.5)
WBC: 7 10*3/uL (ref 4.0–10.5)

## 2016-10-31 LAB — TYPE AND SCREEN
ABO/RH(D): B POS
ANTIBODY SCREEN: NEGATIVE

## 2016-10-31 LAB — PROTEIN / CREATININE RATIO, URINE

## 2016-10-31 MED ORDER — LACTATED RINGERS IV SOLN
INTRAVENOUS | Status: DC
  Administered 2016-10-31: 22:00:00 via INTRAVENOUS

## 2016-10-31 MED ORDER — TERBUTALINE SULFATE 1 MG/ML IJ SOLN: 0.2500 mg | Freq: Once | INTRAMUSCULAR | Status: DC | PRN

## 2016-10-31 MED ORDER — ACETAMINOPHEN 325 MG PO TABS: 650.0000 mg | ORAL_TABLET | ORAL | Status: DC | PRN

## 2016-10-31 MED ORDER — FENTANYL CITRATE (PF) 100 MCG/2ML IJ SOLN: 100.0000 ug | INTRAMUSCULAR | Status: DC | PRN

## 2016-10-31 MED ORDER — LACTATED RINGERS IV SOLN: 500.0000 mL | Freq: Once | INTRAVENOUS | Status: DC

## 2016-10-31 MED ORDER — OXYTOCIN 40 UNITS IN LACTATED RINGERS INFUSION - SIMPLE MED
1.0000 m[IU]/min | INTRAVENOUS | Status: DC
  Filled 2016-10-31: qty 1000

## 2016-10-31 MED ORDER — PHENYLEPHRINE 40 MCG/ML (10ML) SYRINGE FOR IV PUSH (FOR BLOOD PRESSURE SUPPORT): 80.0000 ug | PREFILLED_SYRINGE | INTRAVENOUS | Status: DC | PRN

## 2016-10-31 MED ORDER — PENICILLIN G POTASSIUM 5000000 UNITS IJ SOLR
5.0000 10*6.[IU] | Freq: Once | INTRAVENOUS | Status: AC
  Administered 2016-10-31: 5 10*6.[IU] via INTRAVENOUS
  Filled 2016-10-31: qty 5

## 2016-10-31 MED ORDER — SOD CITRATE-CITRIC ACID 500-334 MG/5ML PO SOLN
30.0000 mL | ORAL | Status: DC | PRN
  Administered 2016-11-01: 30 mL via ORAL
  Filled 2016-10-31: qty 15

## 2016-10-31 MED ORDER — LACTATED RINGERS IV SOLN: 500.0000 mL | INTRAVENOUS | Status: DC | PRN

## 2016-10-31 MED ORDER — FENTANYL 2.5 MCG/ML BUPIVACAINE 1/10 % EPIDURAL INFUSION (WH - ANES)
14.0000 mL/h | INTRAMUSCULAR | Status: DC | PRN
  Administered 2016-10-31 – 2016-11-01 (×2): 14 mL/h via EPIDURAL
  Filled 2016-10-31: qty 100

## 2016-10-31 MED ORDER — OXYTOCIN 40 UNITS IN LACTATED RINGERS INFUSION - SIMPLE MED: 2.5000 [IU]/h | INTRAVENOUS | Status: DC

## 2016-10-31 MED ORDER — LIDOCAINE HCL (PF) 1 % IJ SOLN
INTRAMUSCULAR | Status: DC | PRN
  Administered 2016-10-31: 4 mL
  Administered 2016-10-31: 6 mL via EPIDURAL

## 2016-10-31 MED ORDER — EPHEDRINE 5 MG/ML INJ: 10.0000 mg | INTRAVENOUS | Status: DC | PRN

## 2016-10-31 MED ORDER — PENICILLIN G POT IN DEXTROSE 60000 UNIT/ML IV SOLN
3.0000 10*6.[IU] | INTRAVENOUS | Status: DC
  Administered 2016-11-01: 3 10*6.[IU] via INTRAVENOUS
  Filled 2016-10-31 (×3): qty 50

## 2016-10-31 MED ORDER — LIDOCAINE HCL (PF) 1 % IJ SOLN
30.0000 mL | INTRAMUSCULAR | Status: DC | PRN
  Administered 2016-11-01: 30 mL via SUBCUTANEOUS
  Filled 2016-10-31: qty 30

## 2016-10-31 MED ORDER — FENTANYL CITRATE (PF) 100 MCG/2ML IJ SOLN: 50.0000 ug | INTRAMUSCULAR | Status: DC | PRN

## 2016-10-31 MED ORDER — DIPHENHYDRAMINE HCL 50 MG/ML IJ SOLN: 12.5000 mg | INTRAMUSCULAR | Status: DC | PRN

## 2016-10-31 MED ORDER — ONDANSETRON HCL 4 MG/2ML IJ SOLN: 4.0000 mg | Freq: Four times a day (QID) | INTRAMUSCULAR | Status: DC | PRN

## 2016-10-31 MED ORDER — OXYTOCIN BOLUS FROM INFUSION
500.0000 mL | Freq: Once | INTRAVENOUS | Status: AC
  Administered 2016-11-01: 500 mL via INTRAVENOUS

## 2016-10-31 MED ORDER — FENTANYL 2.5 MCG/ML BUPIVACAINE 1/10 % EPIDURAL INFUSION (WH - ANES)
INTRAMUSCULAR | Status: AC
  Filled 2016-10-31: qty 100

## 2016-10-31 MED ORDER — PHENYLEPHRINE 40 MCG/ML (10ML) SYRINGE FOR IV PUSH (FOR BLOOD PRESSURE SUPPORT)
PREFILLED_SYRINGE | INTRAVENOUS | Status: AC
  Filled 2016-10-31: qty 20

## 2016-10-31 NOTE — Anesthesia Procedure Notes (Signed)
Procedures

## 2016-10-31 NOTE — Anesthesia Procedure Notes (Signed)
Epidural Patient location during procedure: OB Start time: 10/31/2016 11:40 PM End time: 10/31/2016 11:53 PM  Preanesthetic Checklist Completed: patient identified, site marked, surgical consent, pre-op evaluation, timeout performed, IV checked, risks and benefits discussed and monitors and equipment checked  Epidural Patient position: sitting Prep: site prepped and draped and DuraPrep Patient monitoring: continuous pulse ox and blood pressure Approach: midline Location: L3-L4 Injection technique: LOR air  Needle:  Needle type: Tuohy  Needle gauge: 17 G Needle length: 9 cm and 9 Needle insertion depth: 6 cm Catheter type: closed end flexible Catheter size: 19 Gauge Catheter at skin depth: 12 cm Test dose: negative  Assessment Events: blood not aspirated, injection not painful, no injection resistance, negative IV test and no paresthesia  Additional Notes Dosing of Epidural:  1st dose, through catheter ............................................Marland Kitchen.  Xylocaine 40 mg  2nd dose, through catheter, after waiting 3 minutes........Marland Kitchen.Xylocaine 60 mg    As each dose occurred, patient was free of IV sx; and patient exhibited no evidence of SA injection.  Patient is more comfortable after epidural dosed. Please see RN's note for documentation of vital signs,and FHR which are stable.  Patient reminded not to try to ambulate with numb legs, and that an RN must be present when she attempts to get up.

## 2016-10-31 NOTE — Progress Notes (Signed)
Interval Update for H&P dated 10/21/16 by Dr. Ilda Basset  HPI: 41 yo [id number] at 39w5 days presenting with concern for elevated BP at home.  Evaluated in MAU - asymptomatic, preeclampsia workup negative.  However, she is having contractions and her cervical exam changed from 2 to 4/50/-3 while she was in the MAU.   Reviewed PMH, PSHx, medications, allergies, OB history, fhx, and social hx with no changes.  Review of Systems  Constitutional: Negative for fever.  HENT: Negative for congestion and sore throat.   Eyes: Negative for blurred vision.  Respiratory: Negative for shortness of breath.   Cardiovascular: Negative for chest pain.  Gastrointestinal: Negative for abdominal pain.  Genitourinary: Negative for dysuria.  Neurological: Negative for headaches.    Vitals:   10/31/16 1916 10/31/16 1930 10/31/16 1946 10/31/16 2128  BP: 118/71 127/77 131/75 136/85  Pulse: 94 93 94 (!) 103  T 98.3 at 1841 Resp 18 at 1841  Physical Exam  Constitutional: She is oriented to person, place, and time and well-developed, well-nourished, and in no distress.  HENT:  Head: Normocephalic and atraumatic.  Right Ear: External ear normal.  Left Ear: External ear normal.  Mouth/Throat: Oropharynx is clear and moist.  Eyes: Conjunctivae and EOM are normal.  Neck: Normal range of motion. Neck supple.  Cardiovascular: Normal rate and regular rhythm.   No murmur heard. Pulmonary/Chest: Effort normal. She has no wheezes. She has no rales.  Abdominal: Soft. Bowel sounds are normal. There is no tenderness.  Musculoskeletal: She exhibits no edema.  Neurological: She is alert and oriented to person, place, and time.  Skin: Skin is warm and dry.  Psychiatric: Affect and judgment normal.    Strep Gp B NAA     Status: Abnormal   Collection Time: 10/07/16  2:05 PM  Result Value Ref Range   Strep Gp B NAA Positive (A) Negative    Comment: Centers for Disease Control and Prevention (CDC) and American Congress of  Obstetricians and Gynecologists (ACOG) guidelines for prevention of perinatal group B streptococcal (GBS) disease specify co-collection of a vaginal and rectal swab specimen to maximize sensitivity of GBS detection. Per the CDC and ACOG, swabbing both the lower vagina and rectum substantially increases the yield of detection compared with sampling the vagina alone. Penicillin G, ampicillin, or cefazolin are indicated for intrapartum prophylaxis of perinatal GBS colonization. Reflex susceptibility testing should be performed prior to use of clindamycin only on GBS isolates from penicillin-allergic women who are considered a high risk for anaphylaxis. Treatment with vancomycin without additional testing is warranted if resistance to clindamycin is noted.   Protein / creatinine ratio, urine     Status: None   Collection Time: 10/31/16  6:30 PM  Result Value Ref Range   Creatinine, Urine <10 mg/dL    Comment: REPEATED TO VERIFY   Total Protein, Urine <6 mg/dL    Comment: REPEATED TO VERIFY NO NORMAL RANGE ESTABLISHED FOR THIS TEST    Protein Creatinine Ratio        0.00 - 0.15 mg/mg[Cre]    Comment: RESULT BELOW REPORTABLE RANGE, UNABLE TO CALCULATE.   Comprehensive metabolic panel     Status: Abnormal   Collection Time: 10/31/16  6:54 PM  Result Value Ref Range   Sodium 136 135 - 145 mmol/L   Potassium 3.3 (L) 3.5 - 5.1 mmol/L   Chloride 106 101 - 111 mmol/L   CO2 20 (L) 22 - 32 mmol/L   Glucose, Bld 97 65 - 99 mg/dL  BUN 8 6 - 20 mg/dL   Creatinine, Ser <0.30 (L) 0.44 - 1.00 mg/dL    Comment: REPEATED TO VERIFY   Calcium 8.6 (L) 8.9 - 10.3 mg/dL   Total Protein 6.5 6.5 - 8.1 g/dL   Albumin 3.0 (L) 3.5 - 5.0 g/dL   AST 16 15 - 41 U/L   ALT 15 14 - 54 U/L   Alkaline Phosphatase 101 38 - 126 U/L   Total Bilirubin 0.7 0.3 - 1.2 mg/dL   GFR calc non Af Amer NOT CALCULATED >60 mL/min   GFR calc Af Amer NOT CALCULATED >60 mL/min    Comment: (NOTE) The eGFR has been calculated  using the CKD EPI equation. This calculation has not been validated in all clinical situations. eGFR's persistently <60 mL/min signify possible Chronic Kidney Disease.    Anion gap 10 5 - 15  CBC     Status: Abnormal   Collection Time: 10/31/16  6:54 PM  Result Value Ref Range   WBC 7.0 4.0 - 10.5 K/uL   RBC 4.02 3.87 - 5.11 MIL/uL   Hemoglobin 11.6 (L) 12.0 - 15.0 g/dL   HCT 34.7 (L) 36.0 - 46.0 %   MCV 86.3 78.0 - 100.0 fL   MCH 28.9 26.0 - 34.0 pg   MCHC 33.4 30.0 - 36.0 g/dL   RDW 15.6 (H) 11.5 - 15.5 %   Platelets 231 150 - 400 K/uL   A/P 41 yo [id number] at 49w5dpresenting in latent labor  #labor:  Admit to L&D; expectant management #pain:  Plans on epidural #fetus:  Expects female, does not want circumcision, plans to breastfeed. Category I fetal heart tracing #MOC: BTL consented and paid for, plan PP, leave in epidural #elevated blood pressure:  x1 here, preE labs wnl, monitor #GBS positive:  Penicillin q4h  JLulu Riding MD, PGY3  CNM attestation:  I have seen and examined this patient; I agree with above documentation in the resident's note.   EConcettina Lethis a 41y.o. [id number] for latent labor  PE: BP 116/66   Pulse 89   Temp 98.3 F (36.8 C) (Oral)   Resp 18   Ht '5\' 1"'$  (1.549 m)   Wt 90.3 kg (199 lb)   LMP 01/27/2016 (Exact Date)   SpO2 98%   BMI 37.60 kg/m  Gen: calm comfortable, NAD Resp: normal effort, no distress Abd: gravid  ROS, labs, PMH reviewed  [facility] for GBS ppx Anticipate SVD  SSerita GrammesCNM 11/01/2016, 9:13 AM

## 2016-10-31 NOTE — MAU Note (Signed)
Urine sent to lab 

## 2016-10-31 NOTE — MAU Provider Note (Signed)
History     CSN: [id number]  Arrival date and time: 10/31/16 [id number]   First Provider Initiated Contact with Patient 10/31/16 1852      Chief Complaint  Patient presents with  . Contractions   Patient is a 41 year old female G7 P4 at 39 weeks 5 days who presents today with some mild contractions but her main concern is elevated blood pressure. She reports she had a blood pressure 148/80 at home. She had a mild headache but it improved with a short nap. She also just doesn't feel great. She has some lower abdominal pressure and feels like she has to use the restroom all the time. She is feeling irregular contractions. She denies blurred vision right upper quadrant pain or significant increase in her swelling.    OB History    Gravida Para Term Preterm AB Living   7 4 4  0 2 4   SAB TAB Ectopic Multiple Live Births   2 0 0 0 4      Past Medical History:  Diagnosis Date  . No pertinent past medical history     Past Surgical History:  Procedure Laterality Date  . left ovarian cyst      Family History  Problem Relation Age of Onset  . Hypertension Father     Social History  Substance Use Topics  . Smoking status: Never Smoker  . Smokeless tobacco: Never Used  . Alcohol use No    Allergies: No Known Allergies  Prescriptions Prior to Admission  Medication Sig Dispense Refill Last Dose  . hydrocortisone (ANUSOL-HC) 2.5 % rectal cream Place 1 application rectally 2 (two) times daily. 30 g 0 10/31/2016 at Unknown time  . Prenatal Vit-Fe Fumarate-FA (PRENATAL MULTIVITAMIN) TABS tablet Take 1 tablet by mouth daily at 12 noon.   10/31/2016 at Unknown time  . psyllium (METAMUCIL) 58.6 % packet Take 1 packet by mouth daily.   10/30/2016 at Unknown time    Review of Systems  Constitutional: Negative for chills and fever.  HENT: Negative for congestion and rhinorrhea.   Respiratory: Negative for cough and shortness of breath.   Cardiovascular: Negative for chest pain and  palpitations.  Gastrointestinal: Negative for abdominal distention, abdominal pain, diarrhea, nausea and vomiting.  Genitourinary: Negative for difficulty urinating, dysuria and frequency.  Neurological: Negative for dizziness, weakness and headaches.   Physical Exam   Blood pressure 127/77, pulse 93, temperature 98.3 F (36.8 C), temperature source Oral, resp. rate 18, last menstrual period 01/27/2016, currently breastfeeding.  Physical Exam  Constitutional: She is oriented to person, place, and time. She appears well-developed and well-nourished.  HENT:  Head: Normocephalic and atraumatic.  Cardiovascular: Normal rate and intact distal pulses.   Respiratory: Effort normal and breath sounds normal. No respiratory distress.  GI: Soft. Bowel sounds are normal. She exhibits no distension. There is no tenderness. There is no rebound and no guarding.  Musculoskeletal: Normal range of motion. She exhibits no edema.  Neurological: She is alert and oriented to person, place, and time.  Skin: Skin is warm and dry.  Psychiatric: She has a normal mood and affect.    MAU Course  Procedures  MDM In MA U patient had a single elevated blood pressure 142/80 but after that she had all normal range blood pressures which progressively decreased down to the one teens and 120s over 70s.  Her cervix was checked twice about an hour apart and was found to be unchanged.  Her PIH evaluation was negative with a  normal protein to creatinine ratio CMP and CBC.  Initially patient's NST was nonreactive but it did become reactive with a baseline heart beat of about 1 45 bpm and moderate variability  Additionally NST from this morning was reviewed and found to be markedly reactive.  Assessment and Plan  #1: Elevated blood pressure in pregnancy may be secondary to pain versus beginnings of gestational hypertension.  #2: Labor: Will admit patient for labor with changing of cervix from 2 cm to 4 cm.  Ernestina Penna 10/31/2016, 7:55 PM

## 2016-10-31 NOTE — MAU Note (Signed)
Pt presents to MAU with ctx and perssure. Reports ctx started yesterday and are q 5-10 min. Pt denies no bleeding or leaking of fluid. Good fetal movement. Reports elevated BP at home.

## 2016-10-31 NOTE — Anesthesia Preprocedure Evaluation (Signed)
Anesthesia Evaluation  Patient identified by MRN, date of birth, ID band Patient awake    Reviewed: Allergy & Precautions, H&P , Patient's Chart, lab work & pertinent test results  Airway Mallampati: II  TM Distance: >3 FB Neck ROM: full    Dental  (+) Teeth Intact   Pulmonary    breath sounds clear to auscultation       Cardiovascular  Rhythm:regular Rate:Normal     Neuro/Psych    GI/Hepatic   Endo/Other  Morbid obesity  Renal/GU      Musculoskeletal   Abdominal   Peds  Hematology   Anesthesia Other Findings       Reproductive/Obstetrics (+) Pregnancy                            Anesthesia Physical Anesthesia Plan  ASA: II  Anesthesia Plan: Epidural   Post-op Pain Management:    Induction:   Airway Management Planned:   Additional Equipment:   Intra-op Plan:   Post-operative Plan:   Informed Consent: I have reviewed the patients History and Physical, chart, labs and discussed the procedure including the risks, benefits and alternatives for the proposed anesthesia with the patient or authorized representative who has indicated his/her understanding and acceptance.   Dental Advisory Given  Plan Discussed with:   Anesthesia Plan Comments: (Labs checked- platelets confirmed with RN in room. Fetal heart tracing, per RN, reported to be stable enough for sitting procedure. Discussed epidural, and patient consents to the procedure:  included risk of possible headache,backache, failed block, allergic reaction, and nerve injury. This patient was asked if she had any questions or concerns before the procedure started.)        Anesthesia Quick Evaluation  

## 2016-11-01 ENCOUNTER — Encounter (HOSPITAL_COMMUNITY): Payer: Self-pay | Admitting: Anesthesiology

## 2016-11-01 ENCOUNTER — Inpatient Hospital Stay (HOSPITAL_COMMUNITY): Payer: Self-pay | Admitting: Anesthesiology

## 2016-11-01 ENCOUNTER — Encounter (HOSPITAL_COMMUNITY)
Admission: AD | Disposition: A | Discharge status: Home / Self Care | Payer: Self-pay | Source: Ambulatory Visit | Attending: Family Medicine

## 2016-11-01 DIAGNOSIS — Z302 Encounter for sterilization: Secondary | ICD-10-CM

## 2016-11-01 DIAGNOSIS — O134 Gestational [pregnancy-induced] hypertension without significant proteinuria, complicating childbirth: Secondary | ICD-10-CM

## 2016-11-01 DIAGNOSIS — Z3A39 39 weeks gestation of pregnancy: Secondary | ICD-10-CM

## 2016-11-01 DIAGNOSIS — O99824 Streptococcus B carrier state complicating childbirth: Secondary | ICD-10-CM

## 2016-11-01 HISTORY — PX: TUBAL LIGATION: SHX77

## 2016-11-01 LAB — RPR: RPR Ser Ql: NONREACTIVE

## 2016-11-01 SURGERY — LIGATION, FALLOPIAN TUBE, POSTPARTUM
Anesthesia: Epidural | Site: Abdomen | Laterality: Bilateral

## 2016-11-01 MED ORDER — LACTATED RINGERS IV SOLN: INTRAVENOUS | Status: DC

## 2016-11-01 MED ORDER — DIBUCAINE 1 % RE OINT: 1.0000 "application " | TOPICAL_OINTMENT | RECTAL | Status: DC | PRN

## 2016-11-01 MED ORDER — ZOLPIDEM TARTRATE 5 MG PO TABS: 5.0000 mg | ORAL_TABLET | Freq: Every evening | ORAL | Status: DC | PRN

## 2016-11-01 MED ORDER — BENZOCAINE-MENTHOL 20-0.5 % EX AERO
1.0000 | INHALATION_SPRAY | CUTANEOUS | Status: DC | PRN
Start: 2016-11-01 — End: 2016-11-02
  Administered 2016-11-01: 1 via TOPICAL
  Filled 2016-11-01: qty 56

## 2016-11-01 MED ORDER — MIDAZOLAM HCL 5 MG/5ML IJ SOLN
INTRAMUSCULAR | Status: DC | PRN
  Administered 2016-11-01: .5 mg via INTRAVENOUS
  Administered 2016-11-01: 0.5 mg via INTRAVENOUS
  Administered 2016-11-01: 1 mg via INTRAVENOUS

## 2016-11-01 MED ORDER — MIDAZOLAM HCL 2 MG/2ML IJ SOLN
INTRAMUSCULAR | Status: AC
  Filled 2016-11-01: qty 2

## 2016-11-01 MED ORDER — SIMETHICONE 80 MG PO CHEW: 80.0000 mg | CHEWABLE_TABLET | ORAL | Status: DC | PRN

## 2016-11-01 MED ORDER — TETANUS-DIPHTH-ACELL PERTUSSIS 5-2.5-18.5 LF-MCG/0.5 IM SUSP: 0.5000 mL | Freq: Once | INTRAMUSCULAR | Status: DC

## 2016-11-01 MED ORDER — BUPIVACAINE HCL 0.5 % IJ SOLN
INTRAMUSCULAR | Status: DC | PRN
  Administered 2016-11-01: 30 mL

## 2016-11-01 MED ORDER — SODIUM BICARBONATE 8.4 % IV SOLN
INTRAVENOUS | Status: DC | PRN
  Administered 2016-11-01: 3 mL via EPIDURAL
  Administered 2016-11-01: 5 mL via EPIDURAL
  Administered 2016-11-01: 2 mL via EPIDURAL
  Administered 2016-11-01: 3 mL via EPIDURAL
  Administered 2016-11-01: 7 mL via EPIDURAL

## 2016-11-01 MED ORDER — WITCH HAZEL-GLYCERIN EX PADS: 1.0000 "application " | MEDICATED_PAD | CUTANEOUS | Status: DC | PRN

## 2016-11-01 MED ORDER — FENTANYL CITRATE (PF) 100 MCG/2ML IJ SOLN
INTRAMUSCULAR | Status: DC | PRN
  Administered 2016-11-01 (×2): 50 ug via INTRAVENOUS

## 2016-11-01 MED ORDER — METOCLOPRAMIDE HCL 5 MG/ML IJ SOLN: 10.0000 mg | Freq: Once | INTRAMUSCULAR | Status: DC | PRN

## 2016-11-01 MED ORDER — ONDANSETRON HCL 4 MG PO TABS: 4.0000 mg | ORAL_TABLET | ORAL | Status: DC | PRN

## 2016-11-01 MED ORDER — DIPHENHYDRAMINE HCL 25 MG PO CAPS: 25.0000 mg | ORAL_CAPSULE | Freq: Four times a day (QID) | ORAL | Status: DC | PRN

## 2016-11-01 MED ORDER — ONDANSETRON HCL 4 MG/2ML IJ SOLN
INTRAMUSCULAR | Status: AC
Start: 2016-11-01 — End: 2016-11-01
  Filled 2016-11-01: qty 2

## 2016-11-01 MED ORDER — ONDANSETRON HCL 4 MG/2ML IJ SOLN
INTRAMUSCULAR | Status: DC | PRN
  Administered 2016-11-01: 4 mg via INTRAVENOUS

## 2016-11-01 MED ORDER — SODIUM BICARBONATE 8.4 % IV SOLN
INTRAVENOUS | Status: AC
  Filled 2016-11-01: qty 50

## 2016-11-01 MED ORDER — BUPIVACAINE HCL (PF) 0.5 % IJ SOLN
INTRAMUSCULAR | Status: AC
  Filled 2016-11-01: qty 30

## 2016-11-01 MED ORDER — OXYTOCIN 40 UNITS IN LACTATED RINGERS INFUSION - SIMPLE MED
1.0000 m[IU]/min | INTRAVENOUS | Status: DC
  Administered 2016-11-01: 2 m[IU]/min via INTRAVENOUS

## 2016-11-01 MED ORDER — PRENATAL MULTIVITAMIN CH
1.0000 | ORAL_TABLET | Freq: Every day | ORAL | Status: DC
  Administered 2016-11-02: 1 via ORAL
  Filled 2016-11-01: qty 1

## 2016-11-01 MED ORDER — FENTANYL CITRATE (PF) 100 MCG/2ML IJ SOLN
INTRAMUSCULAR | Status: AC
  Filled 2016-11-01: qty 2

## 2016-11-01 MED ORDER — COCONUT OIL OIL: 1.0000 "application " | TOPICAL_OIL | Status: DC | PRN

## 2016-11-01 MED ORDER — LIDOCAINE-EPINEPHRINE (PF) 2 %-1:200000 IJ SOLN
INTRAMUSCULAR | Status: AC
  Filled 2016-11-01: qty 20

## 2016-11-01 MED ORDER — IBUPROFEN 600 MG PO TABS
600.0000 mg | ORAL_TABLET | Freq: Four times a day (QID) | ORAL | Status: DC
  Administered 2016-11-01 – 2016-11-02 (×5): 600 mg via ORAL
  Filled 2016-11-01 (×5): qty 1

## 2016-11-01 MED ORDER — SENNOSIDES-DOCUSATE SODIUM 8.6-50 MG PO TABS
2.0000 | ORAL_TABLET | ORAL | Status: DC
  Administered 2016-11-02: 2 via ORAL
  Filled 2016-11-01 (×2): qty 2

## 2016-11-01 MED ORDER — FENTANYL CITRATE (PF) 100 MCG/2ML IJ SOLN
25.0000 ug | INTRAMUSCULAR | Status: DC | PRN
  Administered 2016-11-01: 50 ug via INTRAVENOUS

## 2016-11-01 MED ORDER — CHLOROPROCAINE HCL 1 % IJ SOLN
INTRAMUSCULAR | Status: DC | PRN
  Administered 2016-11-01: 3 mL
  Administered 2016-11-01: 5 mL

## 2016-11-01 MED ORDER — ACETAMINOPHEN 325 MG PO TABS
650.0000 mg | ORAL_TABLET | ORAL | Status: DC | PRN
  Administered 2016-11-01 (×2): 650 mg via ORAL
  Filled 2016-11-01 (×2): qty 2

## 2016-11-01 MED ORDER — ONDANSETRON HCL 4 MG/2ML IJ SOLN: 4.0000 mg | INTRAMUSCULAR | Status: DC | PRN

## 2016-11-01 SURGICAL SUPPLY — 23 items
APL SKNCLS STERI-STRIP NONHPOA (GAUZE/BANDAGES/DRESSINGS) ×1
BENZOIN TINCTURE PRP APPL 2/3 (GAUZE/BANDAGES/DRESSINGS) ×3 IMPLANT
CLIP FILSHIE TUBAL LIGA STRL (Clip) ×6 IMPLANT
CLOSURE WOUND 1/2 X4 (GAUZE/BANDAGES/DRESSINGS) ×1
CLOTH BEACON ORANGE TIMEOUT ST (SAFETY) ×3 IMPLANT
DRSG OPSITE POSTOP 3X4 (GAUZE/BANDAGES/DRESSINGS) ×3 IMPLANT
DURAPREP 26ML APPLICATOR (WOUND CARE) ×3 IMPLANT
GLOVE BIOGEL PI IND STRL 7.0 (GLOVE) ×3 IMPLANT
GLOVE BIOGEL PI INDICATOR 7.0 (GLOVE) ×6
GLOVE ECLIPSE 7.0 STRL STRAW (GLOVE) ×3 IMPLANT
GOWN STRL REUS W/TWL LRG LVL3 (GOWN DISPOSABLE) ×6 IMPLANT
GOWN STRL REUS W/TWL XL LVL3 (GOWN DISPOSABLE) ×3 IMPLANT
NEEDLE HYPO 22GX1.5 SAFETY (NEEDLE) ×3 IMPLANT
NS IRRIG 1000ML POUR BTL (IV SOLUTION) ×3 IMPLANT
PACK ABDOMINAL MINOR (CUSTOM PROCEDURE TRAY) ×3 IMPLANT
PROTECTOR NERVE ULNAR (MISCELLANEOUS) ×3 IMPLANT
STRIP CLOSURE SKIN 1/2X4 (GAUZE/BANDAGES/DRESSINGS) ×2 IMPLANT
SUT VIC AB 0 CT1 27 (SUTURE) ×3
SUT VIC AB 0 CT1 27XBRD ANBCTR (SUTURE) ×1 IMPLANT
SUT VIC AB 4-0 PS2 27 (SUTURE) ×3 IMPLANT
SYR CONTROL 10ML LL (SYRINGE) ×3 IMPLANT
TOWEL OR 17X24 6PK STRL BLUE (TOWEL DISPOSABLE) ×6 IMPLANT
TRAY FOLEY CATH 16FR SILVER (SET/KITS/TRAYS/PACK) ×3 IMPLANT

## 2016-11-01 NOTE — Progress Notes (Signed)
LABOR PROGRESS NOTE  Subjective: Comfortable s/p epidural.    Objective: BP 129/71   Pulse 89   Temp 99.5 F (37.5 C) (Axillary)   Resp 18   Ht 5\' 1"  (1.549 m)   Wt 199 lb (90.3 kg)   LMP 01/27/2016 (Exact Date)   SpO2 98%   BMI 37.60 kg/m    Dilation: 4 Effacement (%): 50 Cervical Position: Posterior Station: -3 Presentation: Vertex Exam by:: Malena CatholicJ Rhoden, MD @ 0130  Assessment / Plan: 41 y.o. [id number] at 6540w6d here for spontaneous onset of labor.  Labor: not progressing, will augment with pitocin Fetal Wellbeing:  Category I Pain Control:  epidural Anticipated MOD:  NSVD  Charlsie MerlesJulia Rhoden, MD 11/01/2016, 1:36 AM

## 2016-11-01 NOTE — Op Note (Signed)
[name] 10/31/2016 - 11/01/2016  PREOPERATIVE DIAGNOSIS:  Multiparity, undesired fertility  POSTOPERATIVE DIAGNOSIS:  Multiparity, undesired fertility  PROCEDURE:  Postpartum Bilateral Tubal Sterilization using Filshie Clips   ANESTHESIA:  Epidural and local analgesia using 0.5% Marcaine  COMPLICATIONS:  None immediate.  ESTIMATED BLOOD LOSS: 5 ml.   INDICATIONS: 41 y.o. W2N5621G7P5025  with undesired fertility,status post vaginal delivery, desires permanent sterilization.  Other reversible forms of contraception were discussed with patient; she declines all other modalities. Risks of procedure discussed with patient including but not limited to: risk of regret, permanence of method, bleeding, infection, injury to surrounding organs and need for additional procedures.  Failure risk of 0.5-1% with increased risk of ectopic gestation if pregnancy occurs was also discussed with patient.     FINDINGS:  Normal uterus, tubes, and ovaries.  PROCEDURE DETAILS: The patient was taken to the operating room where her epidural anesthesia was dosed up to surgical level and found to be adequate.  She was then placed in the dorsal supine position and prepped and draped in sterile fashion.  After an adequate timeout was performed, attention was turned to the patient's abdomen where a small transverse skin incision was made under the umbilical fold. The incision was taken down to the layer of fascia using the scalpel, and fascia was incised, and extended bilaterally using Mayo scissors. The peritoneum was entered in a sharp fashion. Attention was then turned to the patient's uterus, and the right fallopian tube was identified and followed out to the fimbriated end.  A Filshie clip was placed on the rigth fallopian tube about 3 cm from the cornual attachment, with care given to incorporate the underlying mesosalpinx.  A Foley catheter was placed as the uterus was too high to locate the left fallopian tube  following this a similar process was carried out on the left side allowing for bilateral tubal sterilization.  Good hemostasis was noted overall.  Local analgesia was injected into both Filshie application sites.The instruments were then removed from the patient's abdomen and the fascial incision was repaired with 0 Vicryl, and the skin was closed with a 4-0 Vicryl subcuticular stitch. 20cc of 0.5% Marcaine was injected into the incision. The patient tolerated the procedure well.  Instrument, sponge, and needle counts were correct times two.  The patient was then taken to the recovery room awake and in stable condition.  Anil Havard L. Harraway-Smith, M.D., Evern CoreFACOG

## 2016-11-01 NOTE — Transfer of Care (Signed)
Immediate Anesthesia Transfer of Care Note  Patient: Barbara Coffey  Procedure(s) Performed: Procedure(s): POST PARTUM TUBAL LIGATION (Bilateral)  Patient Location: PACU  Anesthesia Type:Epidural  Level of Consciousness: awake, alert  and oriented  Airway & Oxygen Therapy: Patient Spontanous Breathing and Patient connected to nasal cannula oxygen  Post-op Assessment: Report given to RN and Post -op Vital signs reviewed and stable  Post vital signs: Reviewed and stable  Last Vitals:  Vitals:   11/01/16 0845 11/01/16 0950  BP: 116/66 105/63  Pulse: 89 86  Resp: 18 18  Temp:  36.4 C    Last Pain:  Vitals:   11/01/16 0950  TempSrc: Oral  PainSc: 0-No pain         Complications: No apparent anesthesia complications

## 2016-11-01 NOTE — Anesthesia Postprocedure Evaluation (Signed)
Anesthesia Post Note  Patient: [name]  Procedure(s) Performed: * No procedures listed *  Patient location during evaluation: Mother Baby Anesthesia Type: Epidural Level of consciousness: awake and alert and oriented Pain management: satisfactory to patient Vital Signs Assessment: post-procedure vital signs reviewed and stable Respiratory status: spontaneous breathing and nonlabored ventilation Cardiovascular status: stable Postop Assessment: no headache, no backache, no signs of nausea or vomiting, adequate PO intake and patient able to bend at knees (patient up walking) Anesthetic complications: no        Last Vitals:  Vitals:   11/01/16 1230 11/01/16 1259  BP:  (!) 141/78  Pulse: (!) 103 99  Resp: (!) 22 20  Temp: 36.9 C 36.7 C    Last Pain:  Vitals:   11/01/16 1230  TempSrc:   PainSc: 8    Pain Goal:                 Lucynda Rosano

## 2016-11-01 NOTE — Anesthesia Preprocedure Evaluation (Signed)
Anesthesia Evaluation  Patient identified by MRN, date of birth, ID band Patient awake    Reviewed: Allergy & Precautions, H&P , Patient's Chart, lab work & pertinent test results  Airway Mallampati: II  TM Distance: >3 FB Neck ROM: full    Dental  (+) Teeth Intact   Pulmonary    breath sounds clear to auscultation       Cardiovascular  Rhythm:regular Rate:Normal     Neuro/Psych    GI/Hepatic   Endo/Other  Morbid obesity  Renal/GU      Musculoskeletal   Abdominal   Peds  Hematology   Anesthesia Other Findings       Reproductive/Obstetrics                             Anesthesia Physical  Anesthesia Plan  ASA: II  Anesthesia Plan: Epidural   Post-op Pain Management:    Induction:   Airway Management Planned:   Additional Equipment:   Intra-op Plan:   Post-operative Plan:   Informed Consent: I have reviewed the patients History and Physical, chart, labs and discussed the procedure including the risks, benefits and alternatives for the proposed anesthesia with the patient or authorized representative who has indicated his/her understanding and acceptance.   Dental Advisory Given  Plan Discussed with:   Anesthesia Plan Comments: (Labs checked- platelets confirmed with RN in room. Fetal heart tracing, per RN, reported to be stable enough for sitting procedure. Discussed epidural, and patient consents to the procedure:  included risk of possible headache,backache, failed block, allergic reaction, and nerve injury. This patient was asked if she had any questions or concerns before the procedure started.)        Anesthesia Quick Evaluation

## 2016-11-01 NOTE — Lactation Note (Signed)
This note was copied from a baby's chart. Lactation Consultation Note Initial visit at 11 hours of age.  Mom has extensive experience with 4 older children and her last nursing for 3 1/2 years until this pregnancy.   Tech at bedside for bath. Mom denies pain with latching or concerns. [facility] resources given and discussed.  Encouraged to feed with early cues on demand.  Early newborn behavior discussed.  Hand expression demonstrated by mom with colostrum visible.  Mom to call for assist as needed.        Patient Name: Barbara Coffey JXBJY'NToday's Date: 11/01/2016 Reason for consult: Initial assessment   Maternal Data Has patient been taught Hand Expression?: Yes Does the patient have breastfeeding experience prior to this delivery?: Yes  Feeding Feeding Type: Breast Fed Length of feed: 20 min  LATCH Score/Interventions Latch: Grasps breast easily, tongue down, lips flanged, rhythmical sucking.  Audible Swallowing: A few with stimulation Intervention(s): Skin to skin  Type of Nipple: Everted at rest and after stimulation  Comfort (Breast/Nipple): Soft / non-tender     Hold (Positioning): No assistance needed to correctly position infant at breast.  LATCH Score: 9  Lactation Tools Discussed/Used WIC Program: No   Consult Status Consult Status: PRN    Jannifer RodneyShoptaw, Jana Lynn 11/01/2016, 7:32 PM

## 2016-11-01 NOTE — Anesthesia Postprocedure Evaluation (Signed)
Anesthesia Post Note  Patient: [name]  Procedure(s) Performed: Procedure(s) (LRB): POST PARTUM TUBAL LIGATION (Bilateral)  Patient location during evaluation: PACU Anesthesia Type: Epidural Level of consciousness: awake and alert Pain management: pain level controlled Vital Signs Assessment: post-procedure vital signs reviewed and stable Respiratory status: spontaneous breathing, nonlabored ventilation and respiratory function stable Cardiovascular status: stable and blood pressure returned to baseline Anesthetic complications: no        Last Vitals:  Vitals:   11/01/16 1145 11/01/16 1200  BP: 106/65 119/70  Pulse: 91 87  Resp: 14 13  Temp:      Last Pain:  Vitals:   11/01/16 1200  TempSrc:   PainSc: Asleep   Pain Goal:                 Lowella CurbWarren Ray Fatimah Sundquist

## 2016-11-01 NOTE — Progress Notes (Signed)
Called Dr. Genevie AnnSchenk regarding BTL and he stated that patient has paid for the procedure in cash.  Lajuana Matteina Sailor Haughn, RN

## 2016-11-02 ENCOUNTER — Inpatient Hospital Stay (HOSPITAL_COMMUNITY): Admission: RE | Admit: 2016-11-02 | Payer: Self-pay | Source: Ambulatory Visit

## 2016-11-02 MED ORDER — IBUPROFEN 600 MG PO TABS: 600.0000 mg | ORAL_TABLET | Freq: Four times a day (QID) | ORAL | 0 refills | Status: DC

## 2016-11-02 MED ORDER — ACETAMINOPHEN 325 MG PO TABS: 650.0000 mg | ORAL_TABLET | ORAL | 1 refills | Status: DC | PRN

## 2016-11-02 NOTE — Discharge Summary (Signed)
OB Discharge Summary     Patient Name: Barbara Coffey DOB: 11/09/1975 MRN: [id number]  Date of admission: 10/31/2016 Delivering MD: Genice RougeHODEN, JULIA L   Date of discharge: 11/02/2016  Admitting diagnosis: 43WKS CTX, HBP Desire Sterilization   Intrauterine pregnancy: 4949w6d     Secondary diagnosis:  Active Problems:   Group B Streptococcus carrier, +RV culture, currently pregnant   Normal labor  Additional problems: none     Discharge diagnosis: Term Pregnancy Delivered                                                                                                Post partum procedures:postpartum tubal ligation  Augmentation: AROM and Pitocin  Complications: None  Hospital course:  Onset of Labor With Vaginal Delivery     41 y.o. yo [id number] at 3249w6d was admitted in Active Labor on 10/31/2016. Patient had an uncomplicated labor course as follows:  Membrane Rupture Time/Date: 1:27 AM ,11/01/2016   Intrapartum Procedures: Episiotomy: None [1]                                         Lacerations:  2nd degree [3];Perineal [11];Labial [10]  Patient had a delivery of a Viable infant. 43/17/2018  Information for the patient's newborn:  Barbara Coffey, Boy [name] [[id number]][[id number]]  Delivery Method: Vaginal, Spontaneous Delivery (Filed from Delivery Summary)    Pateint had an uncomplicated postpartum course.  She is ambulating, tolerating a regular diet, passing flatus, and urinating well. Patient is discharged home in stable condition on 11/02/16.   Physical exam  Vitals:   11/01/16 1539 11/01/16 1821 11/01/16 2215 11/02/16 0210  BP: 116/72 (!) 140/59 109/60 (!) 86/50  Pulse: (!) 101 93 99 69  Resp:  20 20 20   Temp: 98.5 F (36.9 C) 98.9 F (37.2 C) 98.7 F (37.1 C) 97.9 F (36.6 C)  TempSrc: Oral Oral Oral Oral  SpO2:   97% 98%  Weight:      Height:       General: alert, cooperative and no distress Lochia: appropriate Uterine Fundus: firm Incision: Dressing is clean,  dry, and intact DVT Evaluation: No evidence of DVT seen on physical exam. Labs: Lab Results  Component Value Date   WBC 7.0 10/31/2016   HGB 11.6 (L) 10/31/2016   HCT 34.7 (L) 10/31/2016   MCV 86.3 10/31/2016   PLT 231 10/31/2016   CMP Latest Ref Rng & Units 10/31/2016  Glucose 65 - 99 mg/dL 97  BUN 6 - 20 mg/dL 8  Creatinine 1.300.44 - 8.651.00 mg/dL <7.84(O<0.30(L)  Sodium 962135 - 145 mmol/L 136  Potassium 3.5 - 5.1 mmol/L 3.3(L)  Chloride 101 - 111 mmol/L 106  CO2 22 - 32 mmol/L 20(L)  Calcium 8.9 - 10.3 mg/dL 9.5(M8.6(L)  Total Protein 6.5 - 8.1 g/dL 6.5  Total Bilirubin 0.3 - 1.2 mg/dL 0.7  Alkaline Phos 38 - 126 U/L 101  AST 15 - 41 U/L 16  ALT 14 - 54 U/L 15    Discharge instruction:  per After Visit Summary and "Baby and Me Booklet".  After visit meds:  Allergies as of 11/02/2016   No Known Allergies     Medication List    TAKE these medications   acetaminophen 325 MG tablet Commonly known as:  TYLENOL Take 2 tablets (650 mg total) by mouth every 4 (four) hours as needed (for pain scale < 4).   hydrocortisone 2.5 % rectal cream Commonly known as:  ANUSOL-HC Place 1 application rectally 2 (two) times daily.   ibuprofen 600 MG tablet Commonly known as:  ADVIL,MOTRIN Take 1 tablet (600 mg total) by mouth every 6 (six) hours.   prenatal multivitamin Tabs tablet Take 1 tablet by mouth daily at 12 noon.   psyllium 58.6 % packet Commonly known as:  METAMUCIL Take 1 packet by mouth daily.       Diet: routine diet  Activity: Advance as tolerated. Pelvic rest for 6 weeks.   Outpatient follow up:6 weeks Follow up Appt:Future Appointments Date Time Provider Department Center  12/09/2016 10:30 AM Reva Bores, MD [facility]   Follow up Visit:No Follow-up on file.  Postpartum contraception: Tubal Ligation  Newborn Data: Live born female  Birth Weight: 8 lb 8.7 oz (3875 g) APGAR: 8, 9  Baby Feeding: Breast Disposition:home with mother   11/02/2016 Ernestina Penna, MD

## 2016-11-02 NOTE — Addendum Note (Signed)
Addendum  created 11/02/16 [id number] by Shanon PayorSuzanne M Britiny Defrain, CRNA   Charge Capture section accepted

## 2016-11-02 NOTE — Anesthesia Postprocedure Evaluation (Signed)
Anesthesia Post Note  Patient: Barbara Coffey  Procedure(s) Performed: Procedure(s) (LRB): POST PARTUM TUBAL LIGATION (Bilateral)  Patient location during evaluation: Mother Baby Anesthesia Type: Epidural Level of consciousness: awake and alert and oriented Pain management: pain level controlled Vital Signs Assessment: post-procedure vital signs reviewed and stable Respiratory status: spontaneous breathing, nonlabored ventilation and respiratory function stable Cardiovascular status: stable Postop Assessment: no headache, no backache, patient able to bend at knees, no signs of nausea or vomiting, adequate PO intake and spinal receding Anesthetic complications: no        Last Vitals:  Vitals:   11/01/16 2215 11/02/16 0210  BP: 109/60 (!) 86/50  Pulse: 99 69  Resp: 20 20  Temp: 37.1 C 36.6 C    Last Pain:  Vitals:   11/02/16 0642  TempSrc:   PainSc: 0-No pain   Pain Goal:                 Madison HickmanGREGORY,Merrie Epler

## 2016-11-02 NOTE — Addendum Note (Signed)
Addendum  created 11/02/16 0831 by Shanon PayorSuzanne M Myelle Poteat, CRNA   Sign clinical note

## 2016-11-02 NOTE — Discharge Instructions (Signed)
Parto vaginal, cuidados posteriores  (Vaginal Delivery, Care After)  Siga estas instrucciones durante las próximas semanas. Estas indicaciones le proporcionan información acerca de cómo deberá cuidarse después del parto. El médico también podrá darle instrucciones más específicas. El tratamiento ha sido planificado según las prácticas médicas actuales, pero en algunos casos pueden ocurrir problemas. Llame al médico si tiene problemas o preguntas.  QUÉ ESPERAR DESPUÉS DEL PARTO  Después de un parto vaginal, es frecuente tener lo siguiente:  · Hemorragia leve de la vagina.  · Dolor en el abdomen, la vagina y la zona de la piel entre la abertura vaginal y el ano (perineo).  · Calambres pélvicos.  · Fatiga.  INSTRUCCIONES PARA EL CUIDADO EN EL HOGAR  Medicamentos  · Tome los medicamentos de venta libre y los recetados solamente como se lo haya indicado el médico.  · Si le recetaron un antibiótico, tómelo como se lo haya indicado el médico. No interrumpa la administración del antibiótico hasta que lo haya terminado.  Conducir  · No conduzca ni opere maquinaria pesada mientras toma analgésicos recetados.  · No conduzca durante 24 horas si le administraron un sedante.  Estilo de vida  · No beba alcohol. Esto es de suma importancia si está amamantando o toma analgésicos.  · No consuma productos que contengan tabaco, incluidos cigarrillos, tabaco de mascar o cigarrillos electrónicos. Si necesita ayuda para dejar de fumar, consulte al médico.  Comida y bebida  · Beba al menos 8 vasos de 8 onzas (240 cc) de agua todos los días a menos que el médico le indique lo contrario. Si elige amamantar al bebé, quizá deba beber aún más cantidad de agua.  · Ingiera alimentos ricos en fibras todos los días. Estos alimentos pueden ayudarla a prevenir o aliviar el estreñimiento. Los alimentos ricos en fibras incluyen, entre otros:  ? Panes y cereales integrales.  ? Arroz integral.  ? Frijoles.  ? Frutas y verduras  frescas.  Actividad  · Reanude sus actividades normales como se lo haya indicado el médico. Pregúntele al médico qué actividades son seguras para usted.  · Descanse todo lo que pueda. Trate de descansar o tomar una siesta mientras el bebé está durmiendo.  · No levante objetos que pesen más de 10 libras (4,5 kg) hasta que el médico le diga que es seguro hacerlo.  · Hable con el médico sobre cuándo puede volver a tener relaciones sexuales. Esto puede depender de lo siguiente:  ? Riesgo de sufrir infecciones.  ? Velocidad de cicatrización.  ? Comodidad y deseo de tener relaciones sexuales.  Cuidados vaginales  · Si le realizaron una episiotomía o tuvo un desgarro vaginal, contrólese la zona todos los días para detectar signos de infección. Esté atenta a los siguientes signos:  ? Aumento del enrojecimiento, la hinchazón o el dolor.  ? Más líquido o sangre.  ? Calor.  ? Pus o mal olor.  · No use tampones ni se haga duchas vaginales hasta que el médico la autorice.  · Controle la sangre que elimina por la vagina para detectar coágulos. Pueden tener el aspecto de grumos de color rojo oscuro, marrón o negro.  Instrucciones generales  · Mantenga el perineo limpio y seco, como se lo haya indicado el médico.  · Use ropa cómoda y suelta.  · Cuando vaya al baño, siempre higienícese de adelante hacia atrás.  · Pregúntele al médico si puede ducharse o tomar baños de inmersión. Si se le realizó una episiotomía o tuvo un desgarro perineal durante el trabajo del parto o   a cuidar del beb durante al menos algunos das despus de salir del hospital.  Concurra a todas las visitas de seguimiento para usted y el beb, como se lo haya indicado el mdico. Esto es importante. SOLICITE ATENCIN MDICA  SI:  Tiene los siguientes sntomas:  Secrecin vaginal que tiene mal olor.  Dificultad para orinar.  Dolor al Beatrix Shipperorinar.  Aumento o disminucin repentinos de la frecuencia con que defeca.  Ms enrojecimiento, hinchazn o dolor alrededor de la episiotoma o del desgarro vaginal.  Ms secrecin de lquido o sangre de la episiotoma o desgarro vaginal.  Pus o mal olor proveniente de la episiotoma o el desgarro vaginal.  Grant RutsFiebre.  Erupcin cutnea.  Poco inters o falta de inters en actividades que solan gustarle.  Dudas sobre su cuidado y el del beb.  Siente la episiotoma o el desgarro vaginal caliente al tacto.  La episiotoma o el desgarro vaginal se est abriendo o no Adult nurseparece cicatrizar.  Siente dolor en las [place], o estn duras o enrojecidas.  Siente tristeza o preocupacin de forma inusual.  Siente nuseas o vomita.  Elimina cogulos grandes por la vagina. Si expulsa un cogulo sanguneo por la vagina, gurdelo para mostrrselo a su mdico. No tire la cadena sin que el mdico examine el cogulo antes.  Orina ms de lo habitual.  Se siente mareada o se desmaya.  No ha amamantado para nada y no ha tenido un perodo menstrual durante 12 semanas despus del [place].  Dej de amamantar al beb y no ha tenido su perodo menstrual durante 12 semanas despus de dejar de Museum/gallery exhibitions officeramamantar. SOLICITE ATENCIN MDICA DE INMEDIATO SI:  Tiene los siguientes sntomas:  Dolor que no desaparece o no mejora con Research scientist (medical)el medicamento.  Dolor en el pecho.  Dificultad para respirar.  Visin borrosa o Nurse, adultmanchas en la vista.  Pensamientos de autolesionarse o lesionar al beb.  Comienza a Psychiatristsentir dolor en el abdomen o en una de las piernas.  Dolor de cabeza intenso.  Se desmaya.  Tiene una hemorragia tan intensa de la vagina que empapa dos toallitas sanitarias en [place]una hora. Esta informacin no tiene Theme park managercomo fin reemplazar el consejo del mdico. Asegrese de hacerle al mdico cualquier pregunta  que tenga. Document Released: 08/04/2005 Document Revised: 11/26/2015 Document Reviewed: 08/19/2015 Elsevier Interactive Patient Education  2017 Elsevier Inc. Hipertensin durante el embarazo (Hypertension During Pregnancy) La hipertensin tambin se denomina presin arterial alta. La presin arterial hace que se mueva la sangre en el cuerpo. A veces, la fuerza que Northrop Grummanmueve la sangre es demasiado intensa. Cuando est embarazada, esta afeccin se debe controlar atentamente. Puede causar problemas para usted y su beb. CUIDADOS EN EL HOGAR  Cumpla con todos los controles mdicos.  Tome los United Parcelmedicamentos como le indic su mdico. Informe a su mdico sobre todos los medicamentos que toma.  Coma muy poca sal.  Haga ejercicios regularmente.  No beba alcohol.  No fume.  No tome bebidas con cafena.  Acustese sobre el lado izquierdo cuando haga reposo.  Su mdico puede recomendarle que tome una aspirina de dosis baja (81 mg) cada da. SOLICITE AYUDA DE INMEDIATO SI:  Siente un dolor intenso en el vientre (abdominal).  Nota una hinchazn repentina Jabil Circuiten las manos, los tobillos o la cara.  Aument 4libras (1,8kg) o ms en 1semana.  Vomita) repetidas veces.  Tiene una hemorragia por la vagina.  No siente los movimientos del beb.  Tiene cefalea.  Tiene visin doble o borrosa.  Tiene calambres o espasmos musculares.  Conley RollsLe  falta el aire.  Tiene las yemas de los dedos y los labios [place].  Observa sangre en la orina. ASEGRESE DE QUE:  Comprende estas instrucciones.  Controlar su afeccin.  Recibir ayuda de inmediato si no mejora o si empeora. Esta informacin no tiene Theme park manager el consejo del mdico. Asegrese de hacerle al mdico cualquier pregunta que tenga. Document Released: 11/19/2010 Document Revised: 08/25/2014 Document Reviewed: 01/18/2016 Elsevier Interactive Patient Education  2017 [employer].

## 2016-11-02 NOTE — H&P (Signed)
Progress Notes Date of Service: 10/31/2016 9:47 PM Myrtis Ser, CNM  Obstetrics    _0 Hide copied text _1 Hover for attribution information Interval Update for H&P dated 10/21/16 by Dr. Ilda Basset  HPI: 41 yo [id number] at 39w5 days presenting with concern for elevated BP at home.  Evaluated in MAU - asymptomatic, preeclampsia workup negative.  However, she is having contractions and her cervical exam changed from 2 to 4/50/-3 while she was in the MAU.   Reviewed PMH, PSHx, medications, allergies, OB history, fhx, and social hx with no changes.  Review of Systems  Constitutional: Negative for fever.  HENT: Negative for congestion and sore throat.   Eyes: Negative for blurred vision.  Respiratory: Negative for shortness of breath.   Cardiovascular: Negative for chest pain.  Gastrointestinal: Negative for abdominal pain.  Genitourinary: Negative for dysuria.  Neurological: Negative for headaches.          Vitals:   10/31/16 1916 10/31/16 1930 10/31/16 1946 10/31/16 2128  BP: 118/71 127/77 131/75 136/85  Pulse: 94 93 94 (!) 103  T 98.3 at 1841 Resp 18 at 1841  Physical Exam  Constitutional: She is oriented to person, place, and time and well-developed, well-nourished, and in no distress.  HENT:  Head: Normocephalic and atraumatic.  Right Ear: External ear normal.  Left Ear: External ear normal.  Mouth/Throat: Oropharynx is clear and moist.  Eyes: Conjunctivae and EOM are normal.  Neck: Normal range of motion. Neck supple.  Cardiovascular: Normal rate and regular rhythm.   No murmur heard. Pulmonary/Chest: Effort normal. She has no wheezes. She has no rales.  Abdominal: Soft. Bowel sounds are normal. There is no tenderness.  Musculoskeletal: She exhibits no edema.  Neurological: She is alert and oriented to person, place, and time.  Skin: Skin is warm and dry.  Psychiatric: Affect and judgment normal.          Strep Gp B NAA     Status: Abnormal   Collection Time:  10/07/16  2:05 PM  Result Value Ref Range   Strep Gp B NAA Positive (A) Negative    Comment: Centers for Disease Control and Prevention (CDC) and American Congress of Obstetricians and Gynecologists (ACOG) guidelines for prevention of perinatal group B streptococcal (GBS) disease specify co-collection of a vaginal and rectal swab specimen to maximize sensitivity of GBS detection. Per the CDC and ACOG, swabbing both the lower vagina and rectum substantially increases the yield of detection compared with sampling the vagina alone. Penicillin G, ampicillin, or cefazolin are indicated for intrapartum prophylaxis of perinatal GBS colonization. Reflex susceptibility testing should be performed prior to use of clindamycin only on GBS isolates from penicillin-allergic women who are considered a high risk for anaphylaxis. Treatment with vancomycin without additional testing is warranted if resistance to clindamycin is noted.   Protein / creatinine ratio, urine     Status: None   Collection Time: 10/31/16  6:30 PM  Result Value Ref Range   Creatinine, Urine <10 mg/dL    Comment: REPEATED TO VERIFY   Total Protein, Urine <6 mg/dL    Comment: REPEATED TO VERIFY NO NORMAL RANGE ESTABLISHED FOR THIS TEST    Protein Creatinine Ratio        0.00 - 0.15 mg/mg[Cre]    Comment: RESULT BELOW REPORTABLE RANGE, UNABLE TO CALCULATE.   Comprehensive metabolic panel     Status: Abnormal   Collection Time: 10/31/16  6:54 PM  Result Value Ref Range   Sodium 136 135 - 145  mmol/L   Potassium 3.3 (L) 3.5 - 5.1 mmol/L   Chloride 106 101 - 111 mmol/L   CO2 20 (L) 22 - 32 mmol/L   Glucose, Bld 97 65 - 99 mg/dL   BUN 8 6 - 20 mg/dL   Creatinine, Ser <0.30 (L) 0.44 - 1.00 mg/dL    Comment: REPEATED TO VERIFY   Calcium 8.6 (L) 8.9 - 10.3 mg/dL   Total Protein 6.5 6.5 - 8.1 g/dL   Albumin 3.0 (L) 3.5 - 5.0 g/dL   AST 16 15 - 41 U/L   ALT 15 14 - 54 U/L   Alkaline Phosphatase  101 38 - 126 U/L   Total Bilirubin 0.7 0.3 - 1.2 mg/dL   GFR calc non Af Amer NOT CALCULATED >60 mL/min   GFR calc Af Amer NOT CALCULATED >60 mL/min    Comment: (NOTE) The eGFR has been calculated using the CKD EPI equation. This calculation has not been validated in all clinical situations. eGFR's persistently <60 mL/min signify possible Chronic Kidney Disease.    Anion gap 10 5 - 15  CBC     Status: Abnormal   Collection Time: 10/31/16  6:54 PM  Result Value Ref Range   WBC 7.0 4.0 - 10.5 K/uL   RBC 4.02 3.87 - 5.11 MIL/uL   Hemoglobin 11.6 (L) 12.0 - 15.0 g/dL   HCT 34.7 (L) 36.0 - 46.0 %   MCV 86.3 78.0 - 100.0 fL   MCH 28.9 26.0 - 34.0 pg   MCHC 33.4 30.0 - 36.0 g/dL   RDW 15.6 (H) 11.5 - 15.5 %   Platelets 231 150 - 400 K/uL   A/P 41 yo [id number] at 27w5dpresenting in latent labor  #labor:  Admit to L&D; expectant management #pain:  Plans on epidural #fetus:  Expects female, does not want circumcision, plans to breastfeed. Category I fetal heart tracing #MOC: BTL consented and paid for, plan PP, leave in epidural #elevated blood pressure:  x1 here, preE labs wnl, monitor #GBS positive:  Penicillin q4h  JLulu Riding MD, PGY3  CNM attestation:  I have seen and examined this patient; I agree with above documentation in the resident's note.   ESimara Rhyneris a 41y.o. [id number] for latent labor  PE: BP 116/66   Pulse 89   Temp 98.3 F (36.8 C) (Oral)   Resp 18   Ht _0  (1.549 m)   Wt 90.3 kg (199 lb)   LMP 01/27/2016 (Exact Date)   SpO2 98%   BMI 37.60 kg/m  Gen: calm comfortable, NAD Resp: normal effort, no distress Abd: gravid  ROS, labs, PMH reviewed  [facility]: [facility] for GBS ppx Anticipate SVD  SSerita GrammesCNM 11/01/2016, 9:13 AM    Electronically signed by JMaryelizabeth Kaufmann MD at 10/31/2016 10:01 PM Electronically signed by JMaryelizabeth Kaufmann MD at 10/31/2016 10:06  PM Electronically signed by KMyrtis Ser CNM at 11/01/2016 9:15 AM   Attestation of Attending Supervision of Advanced Practitioner (PA/CNM/NP): Evaluation and management procedures were performed by the Advanced Practitioner under my supervision and collaboration.  I have reviewed the Advanced Practitioner's note and chart, and I agree with the management and plan.  TDonnamae Jude MD [facility] WWinfredAttending 11/02/2016 9:33 AM

## 2016-11-03 ENCOUNTER — Encounter (HOSPITAL_COMMUNITY): Payer: Self-pay | Admitting: Obstetrics & Gynecology

## 2016-12-09 ENCOUNTER — Encounter: Payer: Self-pay | Admitting: Family Medicine

## 2016-12-09 ENCOUNTER — Ambulatory Visit (INDEPENDENT_AMBULATORY_CARE_PROVIDER_SITE_OTHER): Payer: Self-pay | Admitting: Family Medicine

## 2016-12-09 NOTE — Progress Notes (Signed)
Post Partum Exam  Barbara Coffey is a 41 y.o. [phone] female who presents for a postpartum visit. She is 5 weeks postpartum following a spontaneous vaginal delivery. I have fully reviewed the prenatal and intrapartum course. The delivery was at [redacted]w[redacted]d gestational weeks.  Anesthesia: epidural. Postpartum course has been unremarkable. Baby's course has been unremarkable. Baby is feeding by breast. Bleeding no bleeding. Bowel function is normal. Bladder function is normal. Patient is not sexually active. Contraception method is tubal ligation. Postpartum depression screening: negative  The following portions of the patient's history were reviewed and updated as appropriate: allergies, current medications, past family history, past medical history, past social history, past surgical history and problem list.  Review of Systems Pertinent items are noted in HPI.    Objective:  unknown if currently breastfeeding.  General:  alert, cooperative and appears stated age  Lungs: normal effort  Heart:  regular rate and rhythm  Abdomen: soft, non-tender; bowel sounds normal; no masses,  no organomegaly and suture removed at umbilicus        Assessment:    Normal postpartum exam. Pap smear not done at today's visit.   Plan:   1. Contraception: tubal ligation 2. Pap due 03/2019 3. Follow up in: 3 years or as needed.

## 2017-06-25 NOTE — Telephone Encounter (Signed)
error 

## 2017-08-29 ENCOUNTER — Ambulatory Visit (INDEPENDENT_AMBULATORY_CARE_PROVIDER_SITE_OTHER): Payer: Self-pay

## 2017-08-29 ENCOUNTER — Encounter: Payer: Self-pay | Admitting: Physician Assistant

## 2017-08-29 ENCOUNTER — Ambulatory Visit (INDEPENDENT_AMBULATORY_CARE_PROVIDER_SITE_OTHER): Payer: Self-pay | Admitting: Physician Assistant

## 2017-08-29 VITALS — BP 108/78 | HR 96 | Temp 98.2°F | Resp 16 | Ht 61.0 in | Wt 176.0 lb

## 2017-08-29 DIAGNOSIS — R202 Paresthesia of skin: Secondary | ICD-10-CM

## 2017-08-29 DIAGNOSIS — R2990 Unspecified symptoms and signs involving the nervous system: Secondary | ICD-10-CM

## 2017-08-29 DIAGNOSIS — M5136 Other intervertebral disc degeneration, lumbar region: Secondary | ICD-10-CM

## 2017-08-29 LAB — POCT CBC
Granulocyte percent: 64.8 %G (ref 37–80)
HCT, POC: 42.4 % (ref 37.7–47.9)
Hemoglobin: 14 g/dL (ref 12.2–16.2)
Lymph, poc: 2.1 (ref 0.6–3.4)
MCH: 29.2 pg (ref 27–31.2)
MCHC: 33 g/dL (ref 31.8–35.4)
MCV: 88.4 fL (ref 80–97)
MID (cbc): 0.4 (ref 0–0.9)
MPV: 7.2 fL (ref 0–99.8)
PLATELET COUNT, POC: 281 10*3/uL (ref 142–424)
POC Granulocyte: 4.5 (ref 2–6.9)
POC LYMPH %: 29.6 % (ref 10–50)
POC MID %: 5.6 % (ref 0–12)
RBC: 4.8 M/uL (ref 4.04–5.48)
RDW, POC: 13.5 %
WBC: 7 10*3/uL (ref 4.6–10.2)

## 2017-08-29 LAB — POCT URINALYSIS DIP (MANUAL ENTRY)
Bilirubin, UA: NEGATIVE
GLUCOSE UA: NEGATIVE mg/dL
Ketones, POC UA: NEGATIVE mg/dL
LEUKOCYTES UA: NEGATIVE
Nitrite, UA: NEGATIVE
Spec Grav, UA: 1.01 (ref 1.010–1.025)
UROBILINOGEN UA: 0.2 U/dL
pH, UA: 7 (ref 5.0–8.0)

## 2017-08-29 LAB — POCT GLYCOSYLATED HEMOGLOBIN (HGB A1C): Hemoglobin A1C: 5.3

## 2017-08-29 LAB — POCT SEDIMENTATION RATE: POCT SED RATE: 33 mm/hr — AB (ref 0–22)

## 2017-08-29 MED ORDER — IBUPROFEN 600 MG PO TABS: 600.0000 mg | ORAL_TABLET | Freq: Three times a day (TID) | ORAL | 0 refills | Status: DC | PRN

## 2017-08-29 NOTE — Progress Notes (Signed)
r   08/29/2017 10:06 AM   DOB: 1976-06-05 / MRN: [id number]  SUBJECTIVE:  Barbara Coffey is a 42 y.o. female presenting for parestesia about the left side.  This has been somewhat progressive over the last year starting with numbness in the foot and leg; after giving birth to a baby. This was uncomplicated per Dr. Tawni Levy note on 12/09/16.  She did have an epidural with prompt tubal ligation after birth. Tells me that she has noticed some tingling in her hands and digits starting about 20 days ago and now left sided facial numbness starting 3 or so days ago.  During this last year she notes no reprieve in the leg numbness, and states that her arm and face seem to be following the same pattern.   She has No Known Allergies.   She  has a past medical history of No pertinent past medical history.    She  reports that  has never smoked. she has never used smokeless tobacco. She reports that she does not drink alcohol or use drugs. She  reports that she does not currently engage in sexual activity. She reports using the following method of birth control/protection: Surgical. The patient  has a past surgical history that includes left ovarian cyst and Tubal ligation (Bilateral, 11/01/2016).  Her family history includes Hypertension in her father.  Review of Systems  Constitutional: Negative for chills, diaphoresis and fever.  Respiratory: Negative for cough, hemoptysis, sputum production, shortness of breath and wheezing.   Cardiovascular: Negative for chest pain, orthopnea and leg swelling.  Gastrointestinal: Negative for nausea.  Skin: Negative for rash.  Neurological: Positive for tingling and sensory change. Negative for dizziness, speech change, focal weakness, seizures and headaches.    The problem list and medications were reviewed and updated by myself where necessary and exist elsewhere in the encounter.   OBJECTIVE:  BP 108/78   Pulse 96   Temp 98.2 F (36.8 C) (Oral)   Resp  16   Ht 5\' 1"  (1.549 m)   Wt 176 lb (79.8 kg)   SpO2 98%   BMI 33.25 kg/m    Wt Readings from Last 3 Encounters:  08/29/17 176 lb (79.8 kg)  12/09/16 180 lb (81.6 kg)  10/31/16 199 lb (90.3 kg)   Temp Readings from Last 3 Encounters:  08/29/17 98.2 F (36.8 C) (Oral)  11/02/16 97.9 F (36.6 C) (Oral)  03/12/16 98 F (36.7 C) (Oral)   BP Readings from Last 3 Encounters:  08/29/17 108/78  12/09/16 111/70  11/02/16 (!) 86/50   Pulse Readings from Last 3 Encounters:  08/29/17 96  12/09/16 83  11/02/16 69     Physical Exam  Constitutional: She is active.  Cardiovascular: Normal rate.  Pulmonary/Chest: Effort normal. No tachypnea.  Neurological: She is alert. She is not disoriented. She displays no atrophy, no tremor and normal reflexes. No cranial nerve deficit or sensory deficit. She exhibits normal muscle tone. She displays a negative Romberg sign. She displays no seizure activity. Coordination and gait normal. GCS eye subscore is 4. GCS verbal subscore is 5. GCS motor subscore is 6.  RAM, Finger to Nose, Heel and Toe walking, tandem walking all intact to challenge.   Skin: Skin is warm.     Results for orders placed or performed in visit on 08/29/17 (from the past 72 hour(s))  POCT CBC     Status: None   Collection Time: 08/29/17  9:27 AM  Result Value Ref Range   WBC  7.0 4.6 - 10.2 K/uL   Lymph, poc 2.1 0.6 - 3.4   POC LYMPH PERCENT 29.6 10 - 50 %L   MID (cbc) 0.4 0 - 0.9   POC MID % 5.6 0 - 12 %M   POC Granulocyte 4.5 2 - 6.9   Granulocyte percent 64.8 37 - 80 %G   RBC 4.80 4.04 - 5.48 M/uL   Hemoglobin 14.0 12.2 - 16.2 g/dL   HCT, POC 11.9 14.7 - 47.9 %   MCV 88.4 80 - 97 fL   MCH, POC 29.2 27 - 31.2 pg   MCHC 33.0 31.8 - 35.4 g/dL   RDW, POC 82.9 %   Platelet Count, POC 281 142 - 424 K/uL   MPV 7.2 0 - 99.8 fL  POCT urinalysis dipstick     Status: Abnormal   Collection Time: 08/29/17  9:31 AM  Result Value Ref Range   Color, UA red (A) yellow    Clarity, UA cloudy (A) clear   Glucose, UA negative negative mg/dL   Bilirubin, UA negative negative   Ketones, POC UA negative negative mg/dL   Spec Grav, UA 5.621 3.086 - 1.025   Blood, UA large (A) negative   pH, UA 7.0 5.0 - 8.0   Protein Ur, POC =30 (A) negative mg/dL   Urobilinogen, UA 0.2 0.2 or 1.0 E.U./dL   Nitrite, UA Negative Negative   Leukocytes, UA Negative Negative  POCT glycosylated hemoglobin (Hb A1C)     Status: None   Collection Time: 08/29/17  9:39 AM  Result Value Ref Range   Hemoglobin A1C 5.3     Dg Cervical Spine 2 Or 3 Views  Result Date: 08/29/2017 CLINICAL DATA:  Numbness.  Question degenerative disc disease. EXAM: CERVICAL SPINE - 2-3 VIEW COMPARISON:  None. FINDINGS: Mild reversal lordosis centered at C5-6. No other malalignment. The pre odontoid space and prevertebral soft tissues are normal. No fractures identified. No other abnormalities. No significant degenerative changes. IMPRESSION: No fracture or traumatic malalignment. Reversal of lordosis centered at C5-6. No other malalignment. Electronically Signed   By: Gerome Sam III M.D   On: 08/29/2017 09:42   Dg Lumbar Spine 2-3 Views  Result Date: 08/29/2017 CLINICAL DATA:  Numbness. EXAM: LUMBAR SPINE - 2-3 VIEW COMPARISON:  None. FINDINGS: The patient is status post tubal ligation. Degenerative disc disease is seen most prominent at L4-5. No fracture or traumatic malalignment. IMPRESSION: Degenerative changes at L4-5. No fracture or traumatic malalignment. Electronically Signed   By: Gerome Sam III M.D   On: 08/29/2017 09:44    ASSESSMENT AND PLAN:  Quanika was seen today for pain, numbness and headache.  Diagnoses and all orders for this visit:  Arm paresthesia, left: Her neurological exam is perfect. This is a chronic problem with no visible manifestation.  Options discussed with patient after rads revealing DDD and cervical spine finding. In the interest of cost she would like to treat  for MSK etiology and will follow up in about 10 days.  If her paresthesia continues or progresses, will refer to neuro.  She is breast feeding.  -     DG Cervical Spine 2 or 3 views; Future -     DG Lumbar Spine 2-3 Views; Future -     POCT SEDIMENTATION RATE -     POCT CBC -     POCT glycosylated hemoglobin (Hb A1C) -     POCT urinalysis dipstick  DDD (degenerative disc disease), lumbar -  ibuprofen (ADVIL,MOTRIN) 600 MG tablet; Take 1 tablet (600 mg total) by mouth every 8 (eight) hours as needed.  Left leg paresthesias -     ibuprofen (ADVIL,MOTRIN) 600 MG tablet; Take 1 tablet (600 mg total) by mouth every 8 (eight) hours as needed.  Facial neurological complaint: No manifestations on neurological exam.  Possibly referred pain? See problem one.     The patient is advised to call or return to clinic if she does not see an improvement in symptoms, or to seek the care of the closest emergency department if she worsens with the above plan.   Deliah BostonMichael Clark, MHS, PA-C Primary Care at [facility] 08/29/2017 10:06 AM

## 2017-08-29 NOTE — Patient Instructions (Addendum)
Take medication exactly as prescribed.  Come back in in about 10 days so we can check on your symptoms.     IF you received an x-ray today, you will receive an invoice from [facility]. Please contact [facility] at [phone] with questions or concerns regarding your invoice.   IF you received labwork today, you will receive an invoice from [facility]. Please contact [facility] at [phone] with questions or concerns regarding your invoice.   Our billing staff will not be able to assist you with questions regarding bills from these companies.  You will be contacted with the lab results as soon as they are available. The fastest way to get your results is to activate your My Chart account. Instructions are located on the last page of this paperwork. If you have not heard from us regarding the results in 2 weeks, please contact this office.

## 2018-04-03 ENCOUNTER — Other Ambulatory Visit: Payer: Self-pay

## 2018-04-03 ENCOUNTER — Ambulatory Visit: Payer: Self-pay | Admitting: Family Medicine

## 2018-04-03 ENCOUNTER — Encounter: Payer: Self-pay | Admitting: Family Medicine

## 2018-04-03 VITALS — Temp 98.2°F | Ht 61.5 in | Wt 176.0 lb

## 2018-04-03 DIAGNOSIS — R112 Nausea with vomiting, unspecified: Secondary | ICD-10-CM

## 2018-04-03 DIAGNOSIS — M25512 Pain in left shoulder: Secondary | ICD-10-CM

## 2018-04-03 DIAGNOSIS — R42 Dizziness and giddiness: Secondary | ICD-10-CM

## 2018-04-03 DIAGNOSIS — M542 Cervicalgia: Secondary | ICD-10-CM

## 2018-04-03 LAB — POCT CBC
Granulocyte percent: 68.9 %G (ref 37–80)
HCT, POC: 42.1 % (ref 37.7–47.9)
Hemoglobin: 13.7 g/dL (ref 12.2–16.2)
LYMPH, POC: 1.6 (ref 0.6–3.4)
MCH: 29 pg (ref 27–31.2)
MCHC: 32.6 g/dL (ref 31.8–35.4)
MCV: 88.7 fL (ref 80–97)
MID (cbc): 0.8 (ref 0–0.9)
MPV: 7.9 fL (ref 0–99.8)
PLATELET COUNT, POC: 281 10*3/uL (ref 142–424)
POC Granulocyte: 5.2 (ref 2–6.9)
POC LYMPH PERCENT: 20.7 %L (ref 10–50)
POC MID %: 10.4 %M (ref 0–12)
RBC: 4.74 M/uL (ref 4.04–5.48)
RDW, POC: 14 %
WBC: 7.6 10*3/uL (ref 4.6–10.2)

## 2018-04-03 LAB — POC MICROSCOPIC URINALYSIS (UMFC): Mucus: ABSENT

## 2018-04-03 LAB — POCT URINALYSIS DIP (MANUAL ENTRY)
Bilirubin, UA: NEGATIVE
Glucose, UA: NEGATIVE mg/dL
Ketones, POC UA: NEGATIVE mg/dL
LEUKOCYTES UA: NEGATIVE
NITRITE UA: NEGATIVE
PH UA: 7.5 (ref 5.0–8.0)
Protein Ur, POC: NEGATIVE mg/dL
Spec Grav, UA: 1.01 (ref 1.010–1.025)
UROBILINOGEN UA: 0.2 U/dL

## 2018-04-03 MED ORDER — PROMETHAZINE HCL 12.5 MG PO TABS: 12.5000 mg | ORAL_TABLET | Freq: Three times a day (TID) | ORAL | 0 refills | Status: DC | PRN

## 2018-04-03 MED ORDER — DICLOFENAC SODIUM 75 MG PO TBEC: 75.0000 mg | DELAYED_RELEASE_TABLET | Freq: Two times a day (BID) | ORAL | 0 refills | Status: DC

## 2018-04-03 NOTE — Progress Notes (Signed)
Patient ID: Barbara Coffey, female    DOB: 07/04/1976  Age: 42 y.o. MRN: [id number]  Chief Complaint  Patient presents with  . Nausea    feeling nauseous and dizzy since yesterday with vomiting. Mostly feels dizzy when she gets out of bed in the morning  . Pain    numbness in left hand and arm. Recurring eneounter from 08/29/17 visit    Subjective:   42 year old lady who woke up yesterday with nausea and dizziness.  She feels dizzy raising up.  She is not had a lot of symptoms like this, just the last 2 days.  Several days ago she started having problems with pain in the left shoulder and neck.  It also caused some numbness and to her left hand.  She was evaluated in January for this, but is done better since that time.  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  Current allergies, medications, problem list, past/family and social histories reviewed.  Objective:  Temp 98.2 F (36.8 C) (Oral)   Ht 5' 1.5" (1.562 m)   Wt 176 lb (79.8 kg)   SpO2 98%   BMI 32.72 kg/m   No major distress.  Neck has good range of motion but has pain on lateral tilt especially to the right but also to the left and rotational motions give similar pain.  She is very tender in the left trapezius and shoulder area good grip good sensation good range of motion of her shoulder.  Assessment & Plan:   Assessment: 1. Nausea and vomiting, intractability of vomiting not specified, unspecified vomiting type   2. Cervical pain (neck)   3. Acute pain of left shoulder   4. Dizziness       Plan: Cervical arthritis and cervical induced shoulder and arm pain.  Will treat with NSAIDs.  She is still nursing.  Check the nausea with a CBC and urinalysis.  Orders Placed This Encounter  Procedures  . POCT CBC  . POCT urinalysis dipstick  . POCT Microscopic Urinalysis ([facility])    No orders of the defined types were placed in this encounter.        Patient Instructions    Phenergan 12.5 mg every 6-8 hrs if  needed for nausea or dizziness   Diclofenac 75 mg twice daily for pain and inflammation of neck and arm.  Sitter taking over-the-counter Zantac (ranitidine) 75 mg once or twice daily for your stomach also.  This reduces stomach acid.  Return if not improved   If you have lab work done today you will be contacted with your lab results within the next 2 weeks.  If you have not heard from us then please contact us. The fastest way to get your results is to register for My Chart.   IF you received an x-ray today, you will receive an invoice from [facility]. Please contact [facility] at [phone] with questions or concerns regarding your invoice.   IF you received labwork today, you will receive an invoice from [facility]. Please contact [facility] at [phone] with questions or concerns regarding your invoice.   Our billing staff will not be able to assist you with questions regarding bills from these companies.  You will be contacted with the lab results as soon as they are available. The fastest way to get your results is to activate your My Chart account. Instructions are located on the last page of this paperwork. If you have not heard from us regarding the results in 2 weeks,  please contact this office.        No follow-ups on file.   Janace Hoardavid Rector Devonshire, MD 04/03/2018

## 2018-04-03 NOTE — Patient Instructions (Addendum)
  Phenergan 12.5 mg every 6-8 hrs if needed for nausea or dizziness   Diclofenac 75 mg twice daily for pain and inflammation of neck and arm.  Sitter taking over-the-counter Zantac (ranitidine) 75 mg once or twice daily for your stomach also.  This reduces stomach acid.  Return if not improved   If you have lab work done today you will be contacted with your lab results within the next 2 weeks.  If you have not heard from us then please contact us. The fastest way to get your results is to register for My Chart.   IF you received an x-ray today, you will receive an invoice from [facility]. Please contact [facility] at [phone] with questions or concerns regarding your invoice.   IF you received labwork today, you will receive an invoice from [facility]. Please contact [facility] at [phone] with questions or concerns regarding your invoice.   Our billing staff will not be able to assist you with questions regarding bills from these companies.  You will be contacted with the lab results as soon as they are available. The fastest way to get your results is to activate your My Chart account. Instructions are located on the last page of this paperwork. If you have not heard from us regarding the results in 2 weeks, please contact this office.

## 2018-04-07 ENCOUNTER — Ambulatory Visit: Payer: Self-pay | Admitting: Physician Assistant

## 2018-06-10 ENCOUNTER — Encounter: Payer: Self-pay | Admitting: Physician Assistant

## 2018-06-10 ENCOUNTER — Ambulatory Visit (INDEPENDENT_AMBULATORY_CARE_PROVIDER_SITE_OTHER): Payer: Self-pay | Admitting: Physician Assistant

## 2018-06-10 ENCOUNTER — Other Ambulatory Visit: Payer: Self-pay

## 2018-06-10 VITALS — BP 96/64 | HR 64 | Temp 98.0°F | Resp 16 | Ht 61.0 in | Wt 171.4 lb

## 2018-06-10 DIAGNOSIS — R1011 Right upper quadrant pain: Secondary | ICD-10-CM

## 2018-06-10 LAB — POC MICROSCOPIC URINALYSIS (UMFC): Mucus: ABSENT

## 2018-06-10 LAB — POCT CBC
Granulocyte percent: 57.9 %G (ref 37–80)
HCT, POC: 39.8 % (ref 29–41)
Hemoglobin: 13.6 g/dL — AB (ref 9.5–13.5)
Lymph, poc: 2.1 (ref 0.6–3.4)
MCH, POC: 29.4 pg (ref 27–31.2)
MCHC: 34.3 g/dL (ref 31.8–35.4)
MCV: 85.7 fL (ref 76–111)
MID (cbc): 0.3 (ref 0–0.9)
MPV: 7.1 fL (ref 0–99.8)
POC Granulocyte: 3.4 (ref 2–6.9)
POC LYMPH PERCENT: 36.4 % (ref 10–50)
POC MID %: 5.7 % (ref 0–12)
Platelet Count, POC: 247 10*3/uL (ref 142–424)
RBC: 4.64 M/uL (ref 4.04–5.48)
RDW, POC: 14.1 %
WBC: 5.9 10*3/uL (ref 4.6–10.2)

## 2018-06-10 LAB — POCT URINALYSIS DIP (MANUAL ENTRY)
Bilirubin, UA: NEGATIVE
Glucose, UA: NEGATIVE mg/dL
Ketones, POC UA: NEGATIVE mg/dL
Leukocytes, UA: NEGATIVE
Nitrite, UA: NEGATIVE
Protein Ur, POC: NEGATIVE mg/dL
Spec Grav, UA: 1.01 (ref 1.010–1.025)
Urobilinogen, UA: 0.2 E.U./dL
pH, UA: 7 (ref 5.0–8.0)

## 2018-06-10 LAB — POCT URINE PREGNANCY: Preg Test, Ur: NEGATIVE

## 2018-06-10 NOTE — Patient Instructions (Addendum)
  Ultrasound  APPOINTMENT IS Friday, 06/11/18 AT 1:30 PM     [address] BRING PHOTO ID.    If you have lab work done today you will be contacted with your lab results within the next 2 weeks.  If you have not heard from [place] then please contact us. The fastest way to get your results is to register for My Chart.   IF you received an x-ray today, you will receive an invoice from [facility]. Please contact [facility] at [phone] with questions or concerns regarding your invoice.   IF you received labwork today, you will receive an invoice from [place]. Please contact [facility] at [phone] with questions or concerns regarding your invoice.   Our billing staff will not be able to assist you with questions regarding bills from these companies.  You will be contacted with the lab results as soon as they are available. The fastest way to get your results is to activate your My Chart account. Instructions are located on the last page of this paperwork. If you have not heard from [place] regarding the results in 2 weeks, please contact this office.

## 2018-06-10 NOTE — Progress Notes (Signed)
Barbara Coffey  MRN: [id number] DOB: 06-14-1976  PCP: Patient, No Pcp Per  Subjective:  Pt is a 42 year old female who presents to clinic for right sided pain x 3 days. Primary language is Spanish.  Endorses feeling "full" and nausea.  Pain is constant. worse with eating. Nothing makes it better. Pain radiates to the right upper back Pain is about 6-7/10. Pain is unchanged since 4 days ago. Every time she eats she needs to have a BM.  Last BM today. Denies straining to have BM.  BM have been normal but more frequent. Has 4-5 BM/day. Normally it was 1-2 BM/day. Stools are well formed.  She has not taken anything to feel better.  She still has her gall bladder. FHx gall bladder problems.   the patient  has a past medical history of No pertinent past medical history.  Review of Systems  Constitutional: Negative for chills and fever.  Gastrointestinal: Positive for abdominal pain and nausea. Negative for constipation, diarrhea and vomiting.  Genitourinary: Negative for dysuria, flank pain, frequency, menstrual problem, pelvic pain, urgency, vaginal bleeding, vaginal discharge and vaginal pain.  Musculoskeletal: Negative for back pain.    Patient Active Problem List   Diagnosis Date Noted  . Language barrier affecting health care 09/21/2012    Current Outpatient Medications on File Prior to Visit  Medication Sig Dispense Refill  . hydrocortisone (ANUSOL-HC) 2.5 % rectal cream Place 1 application rectally 2 (two) times daily. 30 g 0  . Prenatal Vit-Fe Fumarate-FA (PRENATAL MULTIVITAMIN) TABS tablet Take 1 tablet by mouth daily at 12 noon.    . diclofenac (VOLTAREN) 75 MG EC tablet Take 1 tablet (75 mg total) by mouth 2 (two) times daily. (Patient not taking: Reported on 06/10/2018) 30 tablet 0   No current facility-administered medications on file prior to visit.     No Known Allergies   Objective:  BP 96/64 (BP Location: Left Arm, Patient Position: Sitting, Cuff Size:  Normal)   Pulse 64   Temp 98 F (36.7 C) (Oral)   Resp 16   Ht _0  (1.549 m)   Wt 171 lb 6.4 oz (77.7 kg)   LMP 05/31/2018   SpO2 99%   BMI 32.39 kg/m   Physical Exam  Constitutional: She appears well-nourished. No distress.  Abdominal: Soft. Normal appearance. She exhibits no mass. There is generalized tenderness. There is no rigidity, no rebound, no guarding, no tenderness at McBurney's point and negative Murphy's sign.  Negative Murphy's sign, however most tenderness is of RUQ with palpation.   Musculoskeletal:       Thoracic back: She exhibits tenderness.       Back:  TTP area indicated on back  Skin: Skin is warm and dry. She is not diaphoretic.  Psychiatric: She has a normal mood and affect. Thought content normal.  Vitals reviewed.   Results for orders placed or performed in visit on 06/10/18  POCT urinalysis dipstick  Result Value Ref Range   Color, UA yellow yellow   Clarity, UA clear clear   Glucose, UA negative negative mg/dL   Bilirubin, UA negative negative   Ketones, POC UA negative negative mg/dL   Spec Grav, UA 1.010 1.010 - 1.025   Blood, UA trace-lysed (A) negative   pH, UA 7.0 5.0 - 8.0   Protein Ur, POC negative negative mg/dL   Urobilinogen, UA 0.2 0.2 or 1.0 E.U./dL   Nitrite, UA Negative Negative   Leukocytes, UA Negative Negative  POCT CBC  Result Value Ref Range   WBC 5.9 4.6 - 10.2 K/uL   Lymph, poc 2.1 0.6 - 3.4   POC LYMPH PERCENT 36.4 10 - 50 %L   MID (cbc) 0.3 0 - 0.9   POC MID % 5.7 0 - 12 %M   POC Granulocyte 3.4 2 - 6.9   Granulocyte percent 57.9 37 - 80 %G   RBC 4.64 4.04 - 5.48 M/uL   Hemoglobin 13.6 (A) 9.5 - 13.5 g/dL   HCT, POC 39.8 29 - 41 %   MCV 85.7 76 - 111 fL   MCH, POC 29.4 27 - 31.2 pg   MCHC 34.3 31.8 - 35.4 g/dL   RDW, POC 14.1 %   Platelet Count, POC 247 142 - 424 K/uL   MPV 7.1 0 - 99.8 fL  POCT Microscopic Urinalysis ([facility])  Result Value Ref Range   WBC,UR,HPF,POC None None WBC/hpf   RBC,UR,HPF,POC None  None RBC/hpf   Bacteria None None, Too numerous to count   Mucus Absent Absent   Epithelial Cells, UR Per Microscopy None None, Too numerous to count cells/hpf  POCT urine pregnancy  Result Value Ref Range   Preg Test, Ur Negative Negative    Assessment and Plan :  1. Right upper quadrant abdominal pain - Pt presents c/o RU Q pain x 4 days. NAD, vitals are stable. WBC is wnl. hcg and UA are negative. Suspect possible gallbladder dysfunction. Plan to have abdominal US done. Called and scheduled appt for tomorrow. Will check lipase and CMP.  - POCT urinalysis dipstick - CMP14+EGFR - Lipase - POCT CBC - POCT Microscopic Urinalysis ([facility]) - POCT urine pregnancy - US Abdomen Complete; Future   Mercer Pod, PA-C  Primary Care at [facility] 06/10/2018 10:31 AM  Please note: Portions of this report may have been transcribed using dragon voice recognition software. Every effort was made to ensure accuracy; however, inadvertent computerized transcription errors may be present.

## 2018-06-11 ENCOUNTER — Other Ambulatory Visit: Payer: Self-pay

## 2018-06-11 ENCOUNTER — Ambulatory Visit
Admission: RE | Admit: 2018-06-11 | Discharge: 2018-06-11 | Disposition: A | Discharge status: Home / Self Care | Payer: No Typology Code available for payment source | Source: Ambulatory Visit | Attending: Physician Assistant | Admitting: Physician Assistant

## 2018-06-11 DIAGNOSIS — R1011 Right upper quadrant pain: Secondary | ICD-10-CM

## 2018-06-11 LAB — LIPASE: Lipase: 25 U/L (ref 14–72)

## 2018-06-11 LAB — CMP14+EGFR
ALT: 23 IU/L (ref 0–32)
AST: 16 IU/L (ref 0–40)
Albumin/Globulin Ratio: 1.8 (ref 1.2–2.2)
Albumin: 4.6 g/dL (ref 3.5–5.5)
Alkaline Phosphatase: 98 IU/L (ref 39–117)
BUN/Creatinine Ratio: 22 (ref 9–23)
BUN: 10 mg/dL (ref 6–24)
Bilirubin Total: 0.4 mg/dL (ref 0.0–1.2)
CO2: 25 mmol/L (ref 20–29)
Calcium: 8.9 mg/dL (ref 8.7–10.2)
Chloride: 101 mmol/L (ref 96–106)
Creatinine, Ser: 0.45 mg/dL — ABNORMAL LOW (ref 0.57–1.00)
GFR calc Af Amer: 141 mL/min/{1.73_m2} (ref >59)
GFR calc non Af Amer: 122 mL/min/{1.73_m2} (ref >59)
Globulin, Total: 2.6 g/dL (ref 1.5–4.5)
Glucose: 75 mg/dL (ref 65–99)
Potassium: 4.2 mmol/L (ref 3.5–5.2)
Sodium: 141 mmol/L (ref 134–144)
Total Protein: 7.2 g/dL (ref 6.0–8.5)

## 2018-06-12 ENCOUNTER — Other Ambulatory Visit: Payer: Self-pay | Admitting: Physician Assistant

## 2018-06-16 ENCOUNTER — Telehealth: Payer: Self-pay | Admitting: Physician Assistant

## 2018-06-16 NOTE — Telephone Encounter (Signed)
Copied from CRM [id number]. Topic: General - Other >> Jun 16, 2018  9:57 AM Herby Abraham C wrote: Reason for CRM: pt and daughter is calling in for lab results.  CB: [phone]

## 2018-06-16 NOTE — Telephone Encounter (Signed)
Copied from CRM [id number]. Topic: Quick Communication - See Telephone Encounter >> Jun 16, 2018  9:59 AM Herby Abraham C wrote: CRM for notification. See Telephone encounter for: 06/16/18.  Pt/ daughter called in. Pt was seen and had labs completed however pt says that she is still in a lot of pain and would like to be advised further.    Please advise.

## 2018-06-17 NOTE — Telephone Encounter (Signed)
Spoke to [employer]. McVey is not in office Discussed pain - dtr states continued R/sided that "pierces" to her back - pt unable to eat like normal Advised to be seen at Urgent care at [facility] - they can see her labs and U/S results

## 2018-06-17 NOTE — Telephone Encounter (Signed)
Message sent to McVey Re: continued abd pain

## 2018-06-17 NOTE — Telephone Encounter (Signed)
Pt's daughter, Harrison Mons calling to check on this again today.  She can be reached at [phone]

## 2018-06-21 ENCOUNTER — Encounter: Payer: Self-pay | Admitting: Physician Assistant

## 2018-06-21 NOTE — Progress Notes (Signed)
Please call pt and let her know Your ultrasound showed a small amount of gallbladder sludge. But no gall stone. Gallbladder sludge typically will correct itself with no intervention. If you are still having pain in 3-4 weeks, we will repeat the ultrasound.  In the meantime, please avoid fatty and greasy foods.

## 2018-06-22 ENCOUNTER — Telehealth: Payer: Self-pay | Admitting: Physician Assistant

## 2018-06-22 NOTE — Telephone Encounter (Signed)
Pt had called the office for [place] results. She stated that she can't eat too much, I get nausea and full. I feel when I eat something, from inside I feel so "full," at night. I feel like inflammation in the right side. I already have 2 weeks and I'am still in pain. Pt advised that I will pass the message on. Pt advised if things worsen to go to ED. She stated "okay I will."

## 2018-06-22 NOTE — Telephone Encounter (Signed)
Copied from CRM [id number]. Topic: Quick Communication - See Telephone Encounter >> Jun 22, 2018 10:35 AM Arlyss Gandy, NT wrote: CRM for notification. See Telephone encounter for: 06/22/18. Pts daughter Vikki Ports calling to receive her moms [place] results. Please advise. CB#: [phone]

## 2018-06-28 NOTE — Telephone Encounter (Signed)
Her [place] results were mailed in a letter last week: "Your ultrasound showed a small amount of gallbladder sludge. There is no evidence of a gallstone. This usually resolves on its own. If you are still having pain in 3-4 weeks, we will repeat the ultrasound. In the meantime, please avoid fatty and greasy foods."

## 2018-09-04 ENCOUNTER — Other Ambulatory Visit: Payer: Self-pay

## 2018-09-04 ENCOUNTER — Ambulatory Visit: Payer: Self-pay | Admitting: Family Medicine

## 2018-09-04 ENCOUNTER — Encounter: Payer: Self-pay | Admitting: Family Medicine

## 2018-09-04 VITALS — BP 104/67 | HR 75 | Temp 98.2°F | Resp 14 | Ht 61.0 in | Wt 171.2 lb

## 2018-09-04 DIAGNOSIS — Z8669 Personal history of other diseases of the nervous system and sense organs: Secondary | ICD-10-CM

## 2018-09-04 DIAGNOSIS — R102 Pelvic and perineal pain: Secondary | ICD-10-CM

## 2018-09-04 DIAGNOSIS — F439 Reaction to severe stress, unspecified: Secondary | ICD-10-CM

## 2018-09-04 DIAGNOSIS — R110 Nausea: Secondary | ICD-10-CM

## 2018-09-04 DIAGNOSIS — R1011 Right upper quadrant pain: Secondary | ICD-10-CM

## 2018-09-04 DIAGNOSIS — R030 Elevated blood-pressure reading, without diagnosis of hypertension: Secondary | ICD-10-CM

## 2018-09-04 DIAGNOSIS — R519 Headache, unspecified: Secondary | ICD-10-CM

## 2018-09-04 DIAGNOSIS — R51 Headache: Secondary | ICD-10-CM

## 2018-09-04 LAB — POCT CBC
Granulocyte percent: 58.3 % (ref 37–80)
HCT, POC: 42.7 % — AB (ref 29–41)
Hemoglobin: 14.1 g/dL (ref 11–14.6)
Lymph, poc: 2.1 (ref 0.6–3.4)
MCH, POC: 29.7 pg (ref 27–31.2)
MCHC: 33 g/dL (ref 31.8–35.4)
MCV: 89.9 fL (ref 76–111)
MID (cbc): 0.3 (ref 0–0.9)
MPV: 7.6 fL (ref 0–99.8)
POC Granulocyte: 3.3 (ref 2–6.9)
POC LYMPH PERCENT: 36.4 % (ref 10–50)
POC MID %: 5.3 % (ref 0–12)
Platelet Count, POC: 248 K/uL (ref 142–424)
RBC: 4.75 M/uL (ref 4.04–5.48)
RDW, POC: 13.6 %
WBC: 5.7 K/uL (ref 4.6–10.2)

## 2018-09-04 LAB — POCT URINALYSIS DIP (MANUAL ENTRY)
Bilirubin, UA: NEGATIVE
Glucose, UA: NEGATIVE mg/dL
Ketones, POC UA: NEGATIVE mg/dL
Leukocytes, UA: NEGATIVE
Nitrite, UA: NEGATIVE
Protein Ur, POC: NEGATIVE mg/dL
Spec Grav, UA: 1.01
Urobilinogen, UA: 0.2 U/dL
pH, UA: 7

## 2018-09-04 LAB — POCT URINE PREGNANCY: Preg Test, Ur: NEGATIVE

## 2018-09-04 LAB — GLUCOSE, POCT (MANUAL RESULT ENTRY): POC Glucose: 85 mg/dL (ref 70–99)

## 2018-09-04 NOTE — Progress Notes (Addendum)
By signing my name below, I, Temidayo Atanda-Ogunleye, attest that this documentation has been prepared under the direction and in the presence of Wendie Agreste, MD. Electronically Signed: Stephania Fragmin, Scribe 09/04/2018 at 10:10 AM.  Subjective:    Patient ID: Barbara Coffey, female    DOB: [birth year], 43 y.o.   MRN: [id number]  Chief Complaint  Patient presents with  . Hypertension    headache, sinus pressure and stuffy nose, ear buzzing, after meal feels jittery and nausea, BP has been high at home with bp cuff so took a little of husband tab and feel better.    HPI Barbara Coffey is a 43 y.o. female that presents today for a BP check and other symptoms per chief complaint. She does not have a history of HTN. Last seen 05/2018 with low normal BP at that time w/o medication.   She reports high BP for the past 3 days for around 139-142/89. Her husband takes lisinopril and yesterday she took a small piece of one of his pills.   She reports sinus pain and nasal congestion, nausea, headache and pressure behind the eyes, blurry vision (started 2 days ago).  She feels better today and reports her vision today is getting better. Denies vomiting.   Immediately following meals she reports feeling jittery. 11 years ago she was treated for anxiety. Last year her father passed away. She denies any recent stress, depression or anxiety.  Last Menstrual Period was 8 days ago. She reports it was normal but she experiences abdominal pain before her period. She reports mild abdominal pain today. History of tubal ligation in 2018.  An [place] on June 11 2018 with small amount of sludge in gallbladder.   Offered translator but she expressed understanding with [place].  Past Medical History:  Diagnosis Date  . No pertinent past medical history    Past Surgical History:  Procedure Laterality Date  . left ovarian cyst    . TUBAL LIGATION Bilateral 11/01/2016   Procedure: POST  PARTUM TUBAL LIGATION;  Surgeon: Lavonia Drafts, MD;  Location: [facility];  Service: Gynecology;  Laterality: Bilateral;   No Known Allergies Prior to Admission medications   Medication Sig Start Date End Date Taking? Authorizing Provider  Prenatal Vit-Fe Fumarate-FA (PRENATAL MULTIVITAMIN) TABS tablet Take 1 tablet by mouth daily at 12 noon.    [provider]   Social History   Socioeconomic History  . Marital status: Married    Spouse name: Not on file  . Number of children: Not on file  . Years of education: Not on file  . Highest education level: Not on file  Occupational History  . Not on file  Social Needs  . Financial resource strain: Not on file  . Food insecurity:    Worry: Not on file    Inability: Not on file  . Transportation needs:    Medical: Not on file    Non-medical: Not on file  Tobacco Use  . Smoking status: Never Smoker  . Smokeless tobacco: Never Used  Substance and Sexual Activity  . Alcohol use: No  . Drug use: No  . Sexual activity: Not Currently    Birth control/protection: Surgical    Comment: BTL  Lifestyle  . Physical activity:    Days per week: Not on file    Minutes per session: Not on file  . Stress: Not on file  Relationships  . Social connections:    Talks on phone: Not on file  Gets together: Not on file    Attends religious service: Not on file    Active member of club or organization: Not on file    Attends meetings of clubs or organizations: Not on file    Relationship status: Not on file  . Intimate partner violence:    Fear of current or ex partner: Not on file    Emotionally abused: Not on file    Physically abused: Not on file    Forced sexual activity: Not on file  Other Topics Concern  . Not on file  Social History Narrative  . Not on file     Review of Systems See HPI    Objective: Blood pressure 104/67, pulse 75, temperature 98.2 F (36.8 C), temperature source Oral, resp. rate 14, height 5'  1" (1.549 m), weight 171 lb 3.2 oz (77.7 kg), SpO2 99 %, currently breastfeeding.   Physical Exam Constitutional:      General: She is not in acute distress.    Appearance: Normal appearance.  HENT:     Head: Normocephalic and atraumatic.  Eyes:     Pupils: Pupils are equal, round, and reactive to light.  Cardiovascular:     Rate and Rhythm: Normal rate and regular rhythm.     Heart sounds: Normal heart sounds. No murmur. No friction rub. No gallop.   Pulmonary:     Effort: Pulmonary effort is normal. No respiratory distress.     Breath sounds: Normal breath sounds. No stridor. No wheezing.  Abdominal:     Tenderness: There is abdominal tenderness (minimal) in the right upper quadrant and suprapubic area. There is right CVA tenderness (minimal). Negative signs include Murphy's sign.     Comments: LUQ/LLQ and epigastic non-tender.  Skin:    Coloration: Skin is not pale.     Findings: No erythema or rash.  Neurological:     General: No focal deficit present.     Mental Status: She is alert and oriented to person, place, and time.     GCS: GCS eye subscore is 4. GCS verbal subscore is 5. GCS motor subscore is 6.     Coordination: Romberg sign negative. Finger-Nose-Finger Test normal.     Gait: Gait normal.  Psychiatric:        Mood and Affect: Mood normal.        Behavior: Behavior normal.        Thought Content: Thought content normal.    Results for orders placed or performed in visit on 09/04/18  POCT glucose (manual entry)  Result Value Ref Range   POC Glucose 85 70 - 99 mg/dl  POCT CBC  Result Value Ref Range   WBC 5.7 4.6 - 10.2 K/uL   Lymph, poc 2.1 0.6 - 3.4   POC LYMPH PERCENT 36.4 10 - 50 %L   MID (cbc) 0.3 0 - 0.9   POC MID % 5.3 0 - 12 %M   POC Granulocyte 3.3 2 - 6.9   Granulocyte percent 58.3 37 - 80 %G   RBC 4.75 4.04 - 5.48 M/uL   Hemoglobin 14.1 11 - 14.6 g/dL   HCT, POC 42.7 (A) 29 - 41 %   MCV 89.9 76 - 111 fL   MCH, POC 29.7 27 - 31.2 pg   MCHC 33.0  31.8 - 35.4 g/dL   RDW, POC 13.6 %   Platelet Count, POC 248 142 - 424 K/uL   MPV 7.6 0 - 99.8 fL  POCT urine pregnancy  Result Value Ref Range   Preg Test, Ur Negative Negative  POCT urinalysis dipstick  Result Value Ref Range   Color, UA yellow yellow   Clarity, UA clear clear   Glucose, UA negative negative mg/dL   Bilirubin, UA negative negative   Ketones, POC UA negative negative mg/dL   Spec Grav, UA 1.010 1.010 - 1.025   Blood, UA trace-intact (A) negative   pH, UA 7.0 5.0 - 8.0   Protein Ur, POC negative negative mg/dL   Urobilinogen, UA 0.2 0.2 or 1.0 E.U./dL   Nitrite, UA Negative Negative   Leukocytes, UA Negative Negative       Assessment & Plan:   Dustie Brittle is a 43 y.o. female Nausea without vomiting - Plan: POCT glucose (manual entry), POCT CBC, Comprehensive metabolic panel, POCT urine pregnancy, POCT urinalysis dipstick Elevated blood pressure reading Nonintractable headache, unspecified chronicity pattern, unspecified headache type - Plan: POCT glucose (manual entry) Suprapubic abdominal pain - Plan: POCT CBC, POCT urinalysis dipstick RUQ abdominal pain  -Reassuring glucose, CBC, no apparent signs of infection on urinalysis.  Check CMP.  Consider repeat imaging if persistent abdominal symptoms.  Other symptoms may be early viral/upper respiratory infection.  Reassuring BP in office, home monitoring discussed, RTC/ER precautions discussed.  History of blurry vision - Plan: POCT glucose (manual entry)  -Now improved.  Still recommended follow-up with ophthalmology/eye care provider, RTC/ER precautions if return of symptoms  Situational stress  -Handout given and resources discussed for counseling.  Recheck 1 week   No orders of the defined types were placed in this encounter.  Patient Instructions   Please call your eye care provider for appointment next week to discuss blurry vision. I'm glad to hear those symptoms have improved.   Urine  test, blood sugar and blood counts look ok. I will check other blood tests.   Here is a number for counseling. See information on stress below.  [facility]: [phone]  Blood pressure ok today. See info on Coffey to check at home. If it is over 140/90 can follow up to discuss meds, but not needed at this time.  Headache and congestion could be early cold symptoms.  Recheck in next 1 week. Sooner if worse.  Return to the clinic or go to the nearest emergency room if any of your symptoms worsen or new symptoms occur.   Dolor abdominal en adultos Abdominal Pain, Adult El dolor abdominal puede tener muchas causas. A menudo, no es grave y [place] sin tratamiento o con tratamiento en la casa. Sin embargo, a Product/process development scientist abdominal es intenso. El mdico revisar sus antecedentes mdicos y le har un examen fsico para tratar de Office manager causa del dolor abdominal. Siga estas instrucciones en su casa:  Tome los medicamentos de venta libre y los recetados solamente como se lo haya indicado el mdico. No tome un laxante a menos que se lo haya indicado el mdico.  Beba suficiente lquido para Theatre manager la orina clara o de color amarillo plido.  Controle su afeccin para ver si hay cambios.  Concurra a todas las visitas de control como se lo haya indicado el mdico. Esto es importante. Comunquese con un mdico si:  El dolor abdominal cambia o empeora.  No tiene apetito o baja de peso sin proponrselo.  Est estreido o tiene diarrea durante ms de 2 o 3das.  Tiene dolor cuando orina o defeca.  El dolor abdominal lo despierta de noche.  El dolor empeora con las  comidas, despus de comer o con determinados alimentos.  Tiene vmitos y no puede retener nada.  Tiene fiebre. Solicite ayuda de inmediato si:  El dolor no desaparece tan pronto como el mdico le dijo que era esperable.  No puede detener los vmitos.  El Social research officer, government se siente solo en zonas del abdomen, como  el lado derecho o la parte inferior izquierda del abdomen.  Las heces son sanguinolentas o de color negro, o de aspecto alquitranado.  Tiene dolor intenso, clicos, o meteorismo en el abdomen.  Tiene signos de deshidratacin, por ejemplo: ? Elmon Else, muy escasa o falta de [place]. ? Labios agrietados. ? [employer]. ? Ojos hundidos. ? Somnolencia. ? Debilidad. Esta informacin no tiene Marine scientist el consejo del mdico. Asegrese de hacerle al mdico cualquier pregunta que tenga. Document Released: 08/04/2005 Document Revised: 07/24/2016 Document Reviewed: 01/16/2016 Elsevier Interactive Patient Education  2019 [place] presin arterial Coffey to Take Your Blood Pressure La presin arterial es la medida de la fuerza de la sangre al presionar contra las paredes de las arterias. Las arterias son los vasos sanguneos que transportan la sangre desde el corazn hacia todas las partes del cuerpo. Su mdico toma su presin arterial en cada visita al consultorio. Usted tambin puede tomar su presin arterial en casa con un aparato de medicin de la presin arterial. Es posible que deba tomar su propia presin arterial:  Para confirmar un diagnstico de presin arterial elevada (hipertensin).  Para controlar su presin arterial a lo largo del tiempo.  Para asegurarse de que el medicamento para la presin arterial est surtiendo Insurance claims handler. Materiales necesarios: Para tomar su presin arterial, necesitar un aparato de medicin de la presin arterial. Puede comprar un aparato de medicin de la presin arterial, o tensimetro, en la mayora de las farmacias o en lnea. Existen varios tipos de tensimetros para Corporate investment banker. Al escoger uno, considere lo siguiente:  Escoja un tensimetro que tenga un brazalete.  Escoja un brazalete que envuelva ceidamente la parte superior de su brazo. Debe poder meter nicamente un dedo entre el brazalete y Water engineer.  No escoja un  tensimetro que mida su presin arterial en la mueca o el dedo. Su mdico puede sugerirle un tensimetro confiable que cumpla con sus necesidades. Cmo prepararse Para obtener la lectura ms precisa, evite realizar lo siguiente durante los 30 minutos previos a Chief Technology Officer su presin arterial:  Beber cafena.  Consumir alcohol.  Comer.  Fumar.  Realizar actividad fsica. Cinco minutos antes de controlar su presin arterial:  Vace la vejiga.  Sintese tranquilo en una silla de comedor y no en un silln blando o sof. Cmo tomarse la presin arterial Para controlar su presin arterial, siga las instrucciones presentes en el manual que se incluye con el tensimetro. Si tiene Best boy, las instrucciones podran ser las siguientes: 1. Sintese con la espalda recta. 2. Coloque los pies en el piso. No cruce los tobillos o las piernas. 3. Apoye su brazo izquierdo al nivel de su corazn en una mesa o escritorio, o en el brazo de la silla. 4. Arremnguese. 5. Envuelva la parte superior de su brazo izquierdo, 1 pulgada (2,5 cm) sobre su codo, con el brazalete. Es mejor Production manager brazalete alrededor de la piel [place]. 6. Ajuste el brazalete alrededor de su brazo. Debe poder meter nicamente un dedo entre el brazalete y Water engineer. 7. Coloque el cable dentro del surco de su codo. 8. Presione el botn  de encendido. 9. Permanezca sentado tranquilamente mientras el brazalete se infla y se desinfla. 10. Lea la lectura digital que aparece en la pantalla del tensimetro y antela (regstrela). 11. Espere de 2a3 minutos, y luego repita los pasos desde el paso 1. Qu significa mi lectura de presin arterial? Una lectura de la presin arterial consta de un nmero ms alto sobre un nmero ms bajo. En condiciones ideales, la presin arterial debe estar por debajo de 120/80. El primer nmero ("superior") es la presin sistlica. Es la medida de la presin de las arterias cuando el corazn  late. El segundo nmero ("inferior") es la presin diastlica. Es la medida de la presin en las arterias cuando el corazn se relaja. La presin arterial se clasifica en cuatro etapas. Las siguientes son las etapas para adultos que no tienen enfermedad grave de corto plazo o una afeccin crnica. La presin sistlica y la presin diastlica se miden en una unidad llamada mm Hg. Normal  Presin sistlica: por debajo de 725.  Presin diastlica: por debajo de 80. Elevada  Presin sistlica: 366-440.  Presin diastlica: por debajo de 80. Etapa 1 de hipertensin  Presin sistlica: 347-425.  Presin diastlica: 95-63. Etapa 2 de hipertensin  Presin sistlica: 875 o ms.  Presin diastlica: 90 o ms. Puede tener prehipertensin o hipertensin incluso si nicamente el nmero sistlico o el diastlico de su lectura es ms elevado que lo normal. Siga estas indicaciones en su casa:  Mida su presin arterial con la frecuencia recomendada por su mdico.  Lleve el tensimetro a su prxima cita con el mdico para asegurarse de lo siguiente: ? Que lo [place] correctamente. ? Que genera lecturas precisas.  Asegrese de entender cules son sus objetivos para la presin arterial.  Dgale a su mdico si tiene efectos secundarios causados por los medicamentos para la presin arterial. Comunquese con un mdico si:  Su presin arterial es sistemticamente alta. Solicite ayuda de inmediato si:  Su presin arterial sistlica est por encima de 180.  Su presin arterial diastlica est por encima de 110. Esta informacin no tiene Marine scientist el consejo del mdico. Asegrese de hacerle al mdico cualquier pregunta que tenga. Document Released: 07/16/2016 Document Revised: 08/03/2017 Document Reviewed: 01/11/2016 Elsevier Interactive Patient Education  2019 [place] estrs es una reaccin normal a los sucesos de la vida. Es lo que se siente cuando la vida le  exige ms de lo que est acostumbrado o ms de lo que cree que puede [place]. Un poco de estrs puede ser til, por ejemplo, cuando se estudia para una prueba o se debe cumplir con una fecha lmite para un trabajo. El estrs muy frecuente o que dura mucho tiempo puede causar problemas. Puede afectar la salud emocional y [place] y las actividades cotidianas normales. El exceso de estrs puede Academic librarian sistema de defensa del cuerpo (sistema inmunitario) y aumentar el riesgo de tener enfermedades fsicas. Si ya tiene un problema mdico, el estrs puede empeorarlo. Cules son las causas? Todos los sucesos de la vida pueden causar estrs. Un suceso que le causa estrs a una persona puede no ser estresante para Theatre manager. Los sucesos ms importantes de la vida, sean positivos o negativos, suelen causar estrs. Por ejemplo:  Perder un trabajo o empezar un Nicholas Lose.  Perder a un ser querido.  Mudarse a una casa o un barrio [place].  Casarse o divorciarse.  Tener un beb.  Lesionarse o enfermarse. [place]  vida menos evidentes tambin pueden causar estrs, especialmente si ocurren da tras da o combinados entre s. Por ejemplo:  Trabajar muchas horas.  Conducir en medio del trfico.  Cuidar de los nios.  Tener deudas.  Estar en una relacin difcil. Cules son los signos o sntomas? El estrs puede causar sntomas emocionales, entre ellos, los siguientes:  Ansiedad. Sentirse preocupado, temeroso, nervioso, abrumado o fuera de control.  Enojo, incluso irritacin o impaciencia.  Depresin. Sentirse triste, decado, desesperanzado o culpable.  Dificultad para concentrarse, recordar o tomar decisiones. El estrs puede causar sntomas fsicos, entre [place], los siguientes:  Dolores y [place]. Estos pueden afectar la cabeza, el cuello, la espalda, el estmago u otras zonas del cuerpo.  Rigidez muscular o tensin mandibular.  Poca energa.  Dificultad  para dormir. El estrs puede provocar conductas poco saludables, entre [place], las siguientes:  Comer para sentirse mejor (comer en exceso) o IT consultant comidas.  Trabajar mucho o postergar tareas.  Fumar, beber alcohol o consumir drogas para sentirse mejor. Cmo se diagnostica? El estrs se diagnostica a travs de una evaluacin que realiza el mdico. Esta afeccin se puede diagnosticar en funcin de lo siguiente:  Sus sntomas y cualquier suceso estresante que le haya ocurrido en la vida.  Sus antecedentes mdicos.  Estudios para Freight forwarder causas de los sntomas. Segn la afeccin, el mdico podr derivarlo a un especialista para que le haga ms evaluaciones. Cmo se trata?  El tratamiento recomendado para el estrs son las tcnicas de control del estrs. Generalmente, no se recomiendan medicamentos para el tratamiento del estrs. Entre las tcnicas para reducir su respuesta a los sucesos estresantes de la vida, se incluyen las siguientes:  Identificacin del Psychologist, forensic. Observarse a s mismo para detectar sntomas de estrs e identificar sus causas. Estas habilidades pueden ayudarlo a evitar sucesos estresantes o a prepararse para ellos.  Control del tiempo. Establecer las prioridades, llevar un calendario de los sucesos y aprender a Software engineer "no". Estas medidas pueden ayudarlo a evitar que asuma muchos compromisos. Las tcnicas para lidiar con el estrs incluyen lo siguiente:  Replantearse el problema. Tratar de pensar de un modo realista los eventos estresantes en lugar de ignorarlos o Horticulturist, commercial. Intente encontrar los aspectos positivos en una situacin estresante, en lugar de enfocarse en los negativos.  Actividad fsica. El ejercicio fsico puede liberar la tensin fsica y Architectural technologist. La clave es encontrar un tipo de ejercicio fsico que disfrute y practique con regularidad.  Tcnicas de relajacin. Estas relajan el cuerpo y la [place]. Nuevamente, la clave es  encontrar una o ms opciones que disfrute y Hydrographic surveyor con regularidad. Por ejemplo: ? Meditacin, respiracin profunda o tcnicas de relajacin progresiva. ? Yoga o tai chi. ? Biorretroalimentacin, tcnicas de plena conciencia o anotaciones en un diario. ? Conservation officer, nature, estar en contacto con la naturaleza o participar en otros pasatiempos.  Llevar un estilo de vida saludable. Siga una dieta equilibrada, tome mucha agua, limite o evite la cafena y [place].  Tener una red de [employer]. Pase tiempo con la familia, los amigos y otras personas cuya compaa disfrute. Exprese sus sentimientos y converse acerca de las cosas que le preocupan con alguien en quien confe. La orientacin o la psicoterapia con un profesional de la salud mental pueden ser de ayuda si tiene dificultades para controlar el estrs por s solo. Siga estas indicaciones en su casa: Estilo de [place] drogas.  No consuma ningn producto que contenga nicotina o tabaco,  como cigarrillos y Psychologist, sport and exercise. Si necesita ayuda para dejar de fumar, consulte al mdico.  Limite el consumo de alcohol a no ms de 50mdida por da si es mujer y no est [place] y 241midas por da si es hombre. Una medida equivale a 12oz (35578mde cerveza, 5oz (148m57me vino o 1oz (44ml40m bebidas alcohlicas de alta graduacin.  No consuma alcohol ni drogas para relajarse.  Lleve una dieta equilibrada que incluya frutas y verduras frescas, cereales integrales, carnes magras, pescado, huevos, frijoles y lcteos descremados. Evite los alimentos procesados y los alimentos con alto contenido de grasa, azcarLocation managerl.  Haga Sherilyn Cooterenos 30min43m de ejercicio 5o ms das de la semana.  Intente dormir de 7 a 8horas todas las noches. Instrucciones generales   Practique las tcnicas de control del estrs como se lo haya indicado el mdico.  Beba suficiente lquido para mantenTheatre managerina clara o de color amarillo  plido.  Tome los medicamentos de venta libre y los recetados solamente como se lo haya indicado el mdico.  ConcurConsulting civil engineeras las visitas de control como se lo haya indicado el mdico. Esto es importante. Comunquese con un mdico si:  Los sntomas empeoran.  Aparecen nuevos sntomas.  Se siente abrumado por los problemas y ya no puede manejarlos solo. Solicite ayuda de inmediato si:  Tiene pensamientos acerca de lastimGlass blower/designerr a otras Producer, television/film/videolguna vez siente que puede lastimarse o lastimMarket researcher dems, o tiene pensamientos de poner fin a su vida, busque ayuda de inmediato. Puede dirigirse al servicio de urgencias ms cercano o comunicarse con:  El servicio de emergeTherapist, sportsen los [place]).  Una lnea de asistencia al suicida y atenciFreight forwarderisis, como la Lnea NLincoln National Corporationevencin del Suicidio (National Suicide Prevention Lifeline) al 1-800-(308)769-1411disponible las 24 horas del da. Resumen  El estrs es una reaccin normal a los sucesos de la vida. Puede causar problemas si es muy frecuente o dura mucho The PNC Financialmejor forma de tratar el estrs es practiPsychologist, prison and probation servicescnicas de control del estrs.  La orientacin o la psicoterapia con un profesional de la salud mental pueden ser de ayuda si tiene dificultades para controlar el estrs por s solo. Esta informacin no tiene como fMarine scientistnsejo del mdico. Asegrese de hacerle al mdico cualquier pregunta que tenga. Document Released: 05/14/2005 Document Revised: 04/14/2017 Document Reviewed: 04/14/2017 Elsevier Interactive Patient Education  2019 [employer] you have lab work done today you will be contacted with your lab results within the next 2 weeks.  If you have not heard from us theKoreaplease contact us. ThKoreafastest way to get your results is to register for My Chart.   IF you received an x-ray today, you will receive an invoice from [facility]. Please contact  [facility] at [phone] or concerns regarding your invoice.   IF you received labwork today, you will receive an invoice from [facility] contact [facility] at [phone] or concerns regarding your invoice.   Our billing staff will not be able to assist you with questions regarding bills from these companies.  You will be contacted with the lab results as soon as they are available. The fastest way to get your results is to activate your My Chart account. Instructions are located on the last page of this paperwork. If you have not heard from us regKoreading the results in 2 weeks, please  contact this office.       I personally performed the services described in this documentation, which was scribed in my presence. The recorded information has been reviewed and considered for accuracy and completeness, addended by me as needed, and agree with information above.  Signed,   Merri Ray, MD [facility].  09/07/18 7:29 PM

## 2018-09-04 NOTE — Patient Instructions (Addendum)
Please call your eye care provider for appointment next week to discuss blurry vision. I'm glad to hear those symptoms have improved.   Urine test, blood sugar and blood counts look ok. I will check other blood tests.   Here is a number for counseling. See information on stress below.  [facility]: [phone]  Blood pressure ok today. See info on how to check at home. If it is over 140/90 can follow up to discuss meds, but not needed at this time.  Headache and congestion could be early cold symptoms.  Recheck in next 1 week. Sooner if worse.  Return to the clinic or go to the nearest emergency room if any of your symptoms worsen or new symptoms occur.   Dolor abdominal en adultos Abdominal Pain, Adult El dolor abdominal puede tener muchas causas. A menudo, no es grave y [place] sin tratamiento o con tratamiento en la casa. Sin embargo, a Product/process development scientist abdominal es intenso. El mdico revisar sus antecedentes mdicos y le har un examen fsico para tratar de Office manager causa del dolor abdominal. Siga estas instrucciones en su casa:  Tome los medicamentos de venta libre y los recetados solamente como se lo haya indicado el mdico. No tome un laxante a menos que se lo haya indicado el mdico.  Beba suficiente lquido para Theatre manager la orina clara o de color amarillo plido.  Controle su afeccin para ver si hay cambios.  Concurra a todas las visitas de control como se lo haya indicado el mdico. Esto es importante. Comunquese con un mdico si:  El dolor abdominal cambia o empeora.  No tiene apetito o baja de peso sin proponrselo.  Est estreido o tiene diarrea durante ms de 2 o 3das.  Tiene dolor cuando orina o defeca.  El dolor abdominal lo despierta de noche.  El dolor empeora con las comidas, despus de comer o con determinados alimentos.  Tiene vmitos y no puede retener nada.  Tiene fiebre. Solicite ayuda de inmediato si:  El dolor no  desaparece tan pronto como el mdico le dijo que era esperable.  No puede detener los vmitos.  El Social research officer, government se siente solo en zonas del abdomen, como el lado derecho o la parte inferior izquierda del abdomen.  Las heces son sanguinolentas o de color negro, o de aspecto alquitranado.  Tiene dolor intenso, clicos, o meteorismo en el abdomen.  Tiene signos de deshidratacin, por ejemplo: ? Elmon Else, muy escasa o falta de [place]. ? Labios agrietados. ? [employer]. ? Ojos hundidos. ? Somnolencia. ? Debilidad. Esta informacin no tiene Marine scientist el consejo del mdico. Asegrese de hacerle al mdico cualquier pregunta que tenga. Document Released: 08/04/2005 Document Revised: 07/24/2016 Document Reviewed: 01/16/2016 Elsevier Interactive Patient Education  2019 [place] presin arterial How to Take Your Blood Pressure La presin arterial es la medida de la fuerza de la sangre al presionar contra las paredes de las arterias. Las arterias son los vasos sanguneos que transportan la sangre desde el corazn hacia todas las partes del cuerpo. Su mdico toma su presin arterial en cada visita al consultorio. Usted tambin puede tomar su presin arterial en casa con un aparato de medicin de la presin arterial. Es posible que deba tomar su propia presin arterial:  Para confirmar un diagnstico de presin arterial elevada (hipertensin).  Para controlar su presin arterial a lo largo del tiempo.  Para asegurarse de que el medicamento para la presin arterial est surtiendo  efecto. Materiales necesarios: Para tomar su presin arterial, necesitar un aparato de medicin de la presin arterial. Puede comprar un aparato de medicin de la presin arterial, o tensimetro, en la mayora de las farmacias o en lnea. Existen varios tipos de tensimetros para Corporate investment banker. Al escoger uno, considere lo siguiente:  Escoja un tensimetro que tenga un brazalete.  Escoja un  brazalete que envuelva ceidamente la parte superior de su brazo. Debe poder meter nicamente un dedo entre el brazalete y Water engineer.  No escoja un tensimetro que mida su presin arterial en la mueca o el dedo. Su mdico puede sugerirle un tensimetro confiable que cumpla con sus necesidades. Cmo prepararse Para obtener la lectura ms precisa, evite realizar lo siguiente durante los 30 minutos previos a Chief Technology Officer su presin arterial:  Beber cafena.  Consumir alcohol.  Comer.  Fumar.  Realizar actividad fsica. Cinco minutos antes de controlar su presin arterial:  Vace la vejiga.  Sintese tranquilo en una silla de comedor y no en un silln blando o sof. Cmo tomarse la presin arterial Para controlar su presin arterial, siga las instrucciones presentes en el manual que se incluye con el tensimetro. Si tiene Best boy, las instrucciones podran ser las siguientes: 1. Sintese con la espalda recta. 2. Coloque los pies en el piso. No cruce los tobillos o las piernas. 3. Apoye su brazo izquierdo al nivel de su corazn en una mesa o escritorio, o en el brazo de la silla. 4. Arremnguese. 5. Envuelva la parte superior de su brazo izquierdo, 1 pulgada (2,5 cm) sobre su codo, con el brazalete. Es mejor Production manager brazalete alrededor de la piel [place]. 6. Ajuste el brazalete alrededor de su brazo. Debe poder meter nicamente un dedo entre el brazalete y Water engineer. 7. Coloque el cable dentro del surco de su codo. 8. Presione el botn de encendido. 9. Permanezca sentado tranquilamente mientras el brazalete se infla y se desinfla. 10. Lea la lectura digital que aparece en la pantalla del tensimetro y antela (regstrela). 11. Espere de 2a3 minutos, y luego repita los pasos desde el paso 1. Qu significa mi lectura de presin arterial? Una lectura de la presin arterial consta de un nmero ms alto sobre un nmero ms bajo. En condiciones ideales, la presin arterial  debe estar por debajo de 120/80. El primer nmero ("superior") es la presin sistlica. Es la medida de la presin de las arterias cuando el corazn late. El segundo nmero ("inferior") es la presin diastlica. Es la medida de la presin en las arterias cuando el corazn se relaja. La presin arterial se clasifica en cuatro etapas. Las siguientes son las etapas para adultos que no tienen enfermedad grave de corto plazo o una afeccin crnica. La presin sistlica y la presin diastlica se miden en una unidad llamada mm Hg. Normal  Presin sistlica: por debajo de 664.  Presin diastlica: por debajo de 80. Elevada  Presin sistlica: 403-474.  Presin diastlica: por debajo de 80. Etapa 1 de hipertensin  Presin sistlica: 259-563.  Presin diastlica: 87-56. Etapa 2 de hipertensin  Presin sistlica: 433 o ms.  Presin diastlica: 90 o ms. Puede tener prehipertensin o hipertensin incluso si nicamente el nmero sistlico o el diastlico de su lectura es ms elevado que lo normal. Siga estas indicaciones en su casa:  Mida su presin arterial con la frecuencia recomendada por su mdico.  Lleve el tensimetro a su prxima cita con el mdico para asegurarse de lo siguiente: ? Fabio Bering  lo [place] correctamente. ? Que genera lecturas precisas.  Asegrese de entender cules son sus objetivos para la presin arterial.  Dgale a su mdico si tiene efectos secundarios causados por los medicamentos para la presin arterial. Comunquese con un mdico si:  Su presin arterial es sistemticamente alta. Solicite ayuda de inmediato si:  Su presin arterial sistlica est por encima de 180.  Su presin arterial diastlica est por encima de 110. Esta informacin no tiene Marine scientist el consejo del mdico. Asegrese de hacerle al mdico cualquier pregunta que tenga. Document Released: 07/16/2016 Document Revised: 08/03/2017 Document Reviewed: 01/11/2016 Elsevier Interactive Patient  Education  2019 [place] estrs es una reaccin normal a los sucesos de la vida. Es lo que se siente cuando la vida le exige ms de lo que est acostumbrado o ms de lo que cree que puede [place]. Un poco de estrs puede ser til, por ejemplo, cuando se estudia para una prueba o se debe cumplir con una fecha lmite para un trabajo. El estrs muy frecuente o que dura mucho tiempo puede causar problemas. Puede afectar la salud emocional y [place] y las actividades cotidianas normales. El exceso de estrs puede Academic librarian sistema de defensa del cuerpo (sistema inmunitario) y aumentar el riesgo de tener enfermedades fsicas. Si ya tiene un problema mdico, el estrs puede empeorarlo. Cules son las causas? Todos los sucesos de la vida pueden causar estrs. Un suceso que le causa estrs a una persona puede no ser estresante para Theatre manager. Los sucesos ms importantes de la vida, sean positivos o negativos, suelen causar estrs. Por ejemplo:  Perder un trabajo o empezar un Nicholas Lose.  Perder a un ser querido.  Mudarse a una casa o un barrio [place].  Casarse o divorciarse.  Tener un beb.  Lesionarse o enfermarse. Los sucesos de la vida menos evidentes tambin pueden causar estrs, especialmente si ocurren da tras da o combinados entre s. Por ejemplo:  Trabajar muchas horas.  Conducir en medio del trfico.  Cuidar de los nios.  Tener deudas.  Estar en una relacin difcil. Cules son los signos o sntomas? El estrs puede causar sntomas emocionales, entre ellos, los siguientes:  Ansiedad. Sentirse preocupado, temeroso, nervioso, abrumado o fuera de control.  Enojo, incluso irritacin o impaciencia.  Depresin. Sentirse triste, decado, desesperanzado o culpable.  Dificultad para concentrarse, recordar o tomar decisiones. El estrs puede causar sntomas fsicos, entre [place], los siguientes:  Dolores y [place]. Estos pueden  afectar la cabeza, el cuello, la espalda, el estmago u otras zonas del cuerpo.  Rigidez muscular o tensin mandibular.  Poca energa.  Dificultad para dormir. El estrs puede provocar conductas poco saludables, entre [place], las siguientes:  Comer para sentirse mejor (comer en exceso) o IT consultant comidas.  Trabajar mucho o postergar tareas.  Fumar, beber alcohol o consumir drogas para sentirse mejor. Cmo se diagnostica? El estrs se diagnostica a travs de una evaluacin que realiza el mdico. Esta afeccin se puede diagnosticar en funcin de lo siguiente:  Sus sntomas y cualquier suceso estresante que le haya ocurrido en la vida.  Sus antecedentes mdicos.  Estudios para Freight forwarder causas de los sntomas. Segn la afeccin, el mdico podr derivarlo a un especialista para que le haga ms evaluaciones. Cmo se trata?  El tratamiento recomendado para el estrs son las tcnicas de control del estrs. Generalmente, no se recomiendan medicamentos para el tratamiento del estrs. Entre las tcnicas para reducir su respuesta a los  sucesos estresantes de la vida, se incluyen las siguientes:  Identificacin del estrs. Observarse a s mismo para detectar sntomas de estrs e identificar sus causas. Estas habilidades pueden ayudarlo a evitar sucesos estresantes o a prepararse para ellos.  Control del tiempo. Establecer las prioridades, llevar un calendario de los sucesos y aprender a Software engineer "no". Estas medidas pueden ayudarlo a evitar que asuma muchos compromisos. Las tcnicas para lidiar con el estrs incluyen lo siguiente:  Replantearse el problema. Tratar de pensar de un modo realista los eventos estresantes en lugar de ignorarlos o Horticulturist, commercial. Intente encontrar los aspectos positivos en una situacin estresante, en lugar de enfocarse en los negativos.  Actividad fsica. El ejercicio fsico puede liberar la tensin fsica y Architectural technologist. La clave es encontrar un tipo  de ejercicio fsico que disfrute y practique con regularidad.  Tcnicas de relajacin. Estas relajan el cuerpo y la [place]. Nuevamente, la clave es encontrar una o ms opciones que disfrute y Hydrographic surveyor con regularidad. Por ejemplo: ? Meditacin, respiracin profunda o tcnicas de relajacin progresiva. ? Yoga o tai chi. ? Biorretroalimentacin, tcnicas de plena conciencia o anotaciones en un diario. ? Conservation officer, nature, estar en contacto con la naturaleza o participar en otros pasatiempos.  Llevar un estilo de vida saludable. Siga una dieta equilibrada, tome mucha agua, limite o evite la cafena y [place].  Tener una red de [employer]. Pase tiempo con la familia, los amigos y otras personas cuya compaa disfrute. Exprese sus sentimientos y converse acerca de las cosas que le preocupan con alguien en quien confe. La orientacin o la psicoterapia con un profesional de la salud mental pueden ser de ayuda si tiene dificultades para controlar el estrs por s solo. Siga estas indicaciones en su casa: Estilo de [place] drogas.  No consuma ningn producto que contenga nicotina o tabaco, como cigarrillos y Psychologist, sport and exercise. Si necesita ayuda para dejar de fumar, consulte al mdico.  Limite el consumo de alcohol a no ms de 80mdida por da si es mujer y no est [place] y 245midas por da si es hombre. Una medida equivale a 12oz (35568mde cerveza, 5oz (148m43me vino o 1oz (44ml17m bebidas alcohlicas de alta graduacin.  No consuma alcohol ni drogas para relajarse.  Lleve una dieta equilibrada que incluya frutas y verduras frescas, cereales integrales, carnes magras, pescado, huevos, frijoles y lcteos descremados. Evite los alimentos procesados y los alimentos con alto contenido de grasa, azcarLocation managerl.  Haga Sherilyn Cooterenos 30min47m de ejercicio 5o ms das de la semana.  Intente dormir de 7 a 8horas todas las noches. Instrucciones  generales   Practique las tcnicas de control del estrs como se lo haya indicado el mdico.  Beba suficiente lquido para mantenTheatre managerina clara o de color amarillo plido.  Tome los medicamentos de venta libre y los recetados solamente como se lo haya indicado el mdico.  ConcurConsulting civil engineeras las visitas de control como se lo haya indicado el mdico. Esto es importante. Comunquese con un mdico si:  Los sntomas empeoran.  Aparecen nuevos sntomas.  Se siente abrumado por los problemas y ya no puede manejarlos solo. Solicite ayuda de inmediato si:  Tiene pensamientos acerca de lastimGlass blower/designerr a otras Producer, television/film/videolguna vez siente que puede lastimarse o lastimMarket researcher dems, o tiene pensamientos de poner fin a su vida, busque ayuda de inmediato. Puede dirigirse al servicio de urgencias ms cercano o comunicarse con:  El  servicio de Therapist, sports (911 en [place]).  Una lnea de asistencia al suicida y Freight forwarder en crisis, como la [employer] de Prevencin del Suicidio (National Suicide Prevention Lifeline) al [phone]. Est disponible las 24 horas del da. Resumen  El estrs es una reaccin normal a los sucesos de la vida. Puede causar problemas si es muy frecuente o dura [employer].  La mejor forma de tratar el estrs es Psychologist, prison and probation services las tcnicas de control del estrs.  La orientacin o la psicoterapia con un profesional de la salud mental pueden ser de ayuda si tiene dificultades para controlar el estrs por s solo. Esta informacin no tiene Marine scientist el consejo del mdico. Asegrese de hacerle al mdico cualquier pregunta que tenga. Document Released: 05/14/2005 Document Revised: 04/14/2017 Document Reviewed: 04/14/2017 Elsevier Interactive Patient Education  [employer].   If you have lab work done today you will be contacted with your lab results within the next 2 weeks.  If you have not heard from [place] then please contact us. The  fastest way to get your results is to register for My Chart.   IF you received an x-ray today, you will receive an invoice from [facility]. Please contact [facility] at [phone] with questions or concerns regarding your invoice.   IF you received labwork today, you will receive an invoice from [place]. Please contact [facility] at [phone] with questions or concerns regarding your invoice.   Our billing staff will not be able to assist you with questions regarding bills from these companies.  You will be contacted with the lab results as soon as they are available. The fastest way to get your results is to activate your My Chart account. Instructions are located on the last page of this paperwork. If you have not heard from [place] regarding the results in 2 weeks, please contact this office.

## 2018-09-05 LAB — COMPREHENSIVE METABOLIC PANEL
A/G RATIO: 1.8 (ref 1.2–2.2)
ALT: 24 IU/L (ref 0–32)
AST: 13 IU/L (ref 0–40)
Albumin: 4.5 g/dL (ref 3.5–5.5)
Alkaline Phosphatase: 94 IU/L (ref 39–117)
BUN/Creatinine Ratio: 19 (ref 9–23)
BUN: 9 mg/dL (ref 6–24)
Bilirubin Total: 0.3 mg/dL (ref 0.0–1.2)
CALCIUM: 9 mg/dL (ref 8.7–10.2)
CHLORIDE: 101 mmol/L (ref 96–106)
CO2: 20 mmol/L (ref 20–29)
Creatinine, Ser: 0.47 mg/dL — ABNORMAL LOW (ref 0.57–1.00)
GFR calc Af Amer: 139 mL/min/{1.73_m2} (ref >59)
GFR, EST NON AFRICAN AMERICAN: 121 mL/min/{1.73_m2} (ref >59)
Globulin, Total: 2.5 g/dL (ref 1.5–4.5)
Glucose: 80 mg/dL (ref 65–99)
Potassium: 4.2 mmol/L (ref 3.5–5.2)
Sodium: 140 mmol/L (ref 134–144)
Total Protein: 7 g/dL (ref 6.0–8.5)

## 2019-04-07 ENCOUNTER — Telehealth: Payer: Self-pay | Admitting: General Practice

## 2019-04-07 ENCOUNTER — Encounter: Payer: Self-pay | Admitting: Emergency Medicine

## 2019-04-07 ENCOUNTER — Ambulatory Visit (INDEPENDENT_AMBULATORY_CARE_PROVIDER_SITE_OTHER): Payer: Self-pay | Admitting: Emergency Medicine

## 2019-04-07 ENCOUNTER — Other Ambulatory Visit: Payer: Self-pay

## 2019-04-07 VITALS — BP 110/69 | HR 72 | Temp 97.6°F | Resp 16 | Wt 182.8 lb

## 2019-04-07 DIAGNOSIS — R102 Pelvic and perineal pain: Secondary | ICD-10-CM

## 2019-04-07 DIAGNOSIS — Z8742 Personal history of other diseases of the female genital tract: Secondary | ICD-10-CM

## 2019-04-07 LAB — POCT URINALYSIS DIP (MANUAL ENTRY)
Bilirubin, UA: NEGATIVE
Blood, UA: NEGATIVE
Glucose, UA: NEGATIVE mg/dL
Ketones, POC UA: NEGATIVE mg/dL
Leukocytes, UA: NEGATIVE
Nitrite, UA: NEGATIVE
Protein Ur, POC: NEGATIVE mg/dL
Spec Grav, UA: 1.01 (ref 1.010–1.025)
Urobilinogen, UA: 0.2 E.U./dL
pH, UA: 7 (ref 5.0–8.0)

## 2019-04-07 LAB — POCT URINE PREGNANCY: Preg Test, Ur: NEGATIVE

## 2019-04-07 LAB — POC MICROSCOPIC URINALYSIS (UMFC): Mucus: ABSENT

## 2019-04-07 MED ORDER — TRAMADOL HCL 50 MG PO TABS: 50.0000 mg | ORAL_TABLET | Freq: Three times a day (TID) | ORAL | 0 refills | Status: AC | PRN

## 2019-04-07 NOTE — Progress Notes (Signed)
Barbara Coffey 43 y.o.   Chief Complaint  Patient presents with  . Pelvic Pain    x 1 week per patient on the RIGHT side to leg  . Nausea    no vomiting and dizziness    HISTORY OF PRESENT ILLNESS: This is a 43 y.o. female complaining of right-sided pelvic pain that started 1 week ago.  Has a history of ovarian cysts.  Tubal ligation 2 years ago.  Married.  Last sexual intercourse 3 days ago without any abnormal symptoms or problems.  Denies abnormal vaginal bleeding or vaginal discharge.  Complaining of nausea but no vomiting.  Still able to eat and drink okay.  Has urinary frequency.  Pain is sharp, waxes and wanes, and radiates to right lumbar area.  No other significant symptoms.  HPI   Prior to Admission medications   Medication Sig Start Date End Date Taking? Authorizing Provider  Prenatal Vit-Fe Fumarate-FA (PRENATAL MULTIVITAMIN) TABS tablet Take 1 tablet by mouth daily at 12 noon.   Yes [provider]    No Known Allergies  Patient Active Problem List   Diagnosis Date Noted  . Language barrier affecting health care 09/21/2012    Past Medical History:  Diagnosis Date  . No pertinent past medical history     Past Surgical History:  Procedure Laterality Date  . left ovarian cyst    . TUBAL LIGATION Bilateral 11/01/2016   Procedure: POST PARTUM TUBAL LIGATION;  Surgeon: Lavonia Drafts, MD;  Location: [facility];  Service: Gynecology;  Laterality: Bilateral;    Social History   Socioeconomic History  . Marital status: Married    Spouse name: Not on file  . Number of children: Not on file  . Years of education: Not on file  . Highest education level: Not on file  Occupational History  . Not on file  Social Needs  . Financial resource strain: Not on file  . Food insecurity    Worry: Not on file    Inability: Not on file  . Transportation needs    Medical: Not on file    Non-medical: Not on file  Tobacco Use  . Smoking status: Never  Smoker  . Smokeless tobacco: Never Used  Substance and Sexual Activity  . Alcohol use: No  . Drug use: No  . Sexual activity: Not Currently    Birth control/protection: Surgical    Comment: BTL  Lifestyle  . Physical activity    Days per week: Not on file    Minutes per session: Not on file  . Stress: Not on file  Relationships  . Social Herbalist on phone: Not on file    Gets together: Not on file    Attends religious service: Not on file    Active member of club or organization: Not on file    Attends meetings of clubs or organizations: Not on file    Relationship status: Not on file  . Intimate partner violence    Fear of current or ex partner: Not on file    Emotionally abused: Not on file    Physically abused: Not on file    Forced sexual activity: Not on file  Other Topics Concern  . Not on file  Social History Narrative  . Not on file    Family History  Problem Relation Age of Onset  . Hypertension Father      Review of Systems  Constitutional: Negative.  Negative for chills and fever.  HENT: Negative.  Negative for congestion and sore throat.   Eyes: Negative.   Respiratory: Negative.  Negative for shortness of breath.   Cardiovascular: Negative.  Negative for chest pain and palpitations.  Gastrointestinal: Positive for nausea. Negative for abdominal pain, blood in stool, diarrhea, melena and vomiting.  Genitourinary: Positive for frequency and urgency. Negative for flank pain and hematuria.  Skin: Negative.   Neurological: Negative for dizziness and headaches.  All other systems reviewed and are negative.  Today's Vitals   04/07/19 1401  BP: 110/69  Pulse: 72  Resp: 16  Temp: 97.6 F (36.4 C)  TempSrc: Oral  SpO2: 97%  Weight: 182 lb 12.8 oz (82.9 kg)   Body mass index is 34.54 kg/m.   Physical Exam Vitals signs reviewed.  Constitutional:      Appearance: Normal appearance.  HENT:     Head: Normocephalic.  Eyes:      Extraocular Movements: Extraocular movements intact.     Pupils: Pupils are equal, round, and reactive to light.  Neck:     Musculoskeletal: Normal range of motion.  Cardiovascular:     Rate and Rhythm: Normal rate and regular rhythm.     Heart sounds: Normal heart sounds.  Pulmonary:     Effort: Pulmonary effort is normal.     Breath sounds: Normal breath sounds.  Abdominal:     General: Bowel sounds are normal.     Palpations: Abdomen is soft.     Tenderness: There is abdominal tenderness in the suprapubic area. There is no right CVA tenderness, left CVA tenderness, guarding or rebound. Negative signs include McBurney's sign.    Musculoskeletal: Normal range of motion.  Skin:    General: Skin is warm and dry.  Neurological:     General: No focal deficit present.     Mental Status: She is alert and oriented to person, place, and time.  Psychiatric:        Mood and Affect: Mood normal.        Behavior: Behavior normal.    Results for orders placed or performed in visit on 04/07/19 (from the past 24 hour(s))  POCT urinalysis dipstick     Status: None   Collection Time: 04/07/19  2:19 PM  Result Value Ref Range   Color, UA yellow yellow   Clarity, UA clear clear   Glucose, UA negative negative mg/dL   Bilirubin, UA negative negative   Ketones, POC UA negative negative mg/dL   Spec Grav, UA [id number] [id number] - 1.025   Blood, UA negative negative   pH, UA 7.0 5.0 - 8.0   Protein Ur, POC negative negative mg/dL   Urobilinogen, UA 0.2 0.2 or 1.0 E.U./dL   Nitrite, UA Negative Negative   Leukocytes, UA Negative Negative  POCT urine pregnancy     Status: None   Collection Time: 04/07/19  2:20 PM  Result Value Ref Range   Preg Test, Ur Negative Negative  POCT Microscopic Urinalysis ([facility])     Status: Abnormal   Collection Time: 04/07/19  2:24 PM  Result Value Ref Range   WBC,UR,HPF,POC None None WBC/hpf   RBC,UR,HPF,POC None None RBC/hpf   Bacteria Few (A) None, Too numerous to  count   Mucus Absent Absent   Epithelial Cells, UR Per Microscopy None None, Too numerous to count cells/hpf     ASSESSMENT & PLAN: [name] was seen today for pelvic pain and nausea.  Diagnoses and all orders for this visit:  Pelvic pain -  POCT Microscopic Urinalysis ([facility]) -     POCT urinalysis dipstick -     POCT urine pregnancy -     US Pelvis Complete; Future -     traMADol (ULTRAM) 50 MG tablet; Take 1 tablet (50 mg total) by mouth every 8 (eight) hours as needed for up to 5 days. -     Ambulatory referral to Obstetrics / Gynecology  History of ovarian cyst    Patient Instructions       If you have lab work done today you will be contacted with your lab results within the next 2 weeks.  If you have not heard from us then please contact us. The fastest way to get your results is to register for My Chart.   IF you received an x-ray today, you will receive an invoice from [facility]. Please contact [facility] at [phone] with questions or concerns regarding your invoice.   IF you received labwork today, you will receive an invoice from [facility]. Please contact [facility] at [phone] with questions or concerns regarding your invoice.   Our billing staff will not be able to assist you with questions regarding bills from these companies.  You will be contacted with the lab results as soon as they are available. The fastest way to get your results is to activate your My Chart account. Instructions are located on the last page of this paperwork. If you have not heard from us regarding the results in 2 weeks, please contact this office.     Dolor plvico en la mujer Pelvic Pain, Female El dolor plvico se siente en la parte inferior del vientre (abdomen), debajo del ombligo y a nivel de las caderas. El dolor puede comenzar en forma repentina (ser Constellation Brandsagudo), reaparecer (ser recurrente) o durar mucho tiempo (volverse crnico). El dolor plvico que  dura ms de 6 meses se denomina dolor plvico crnico. El dolor plvico puede tener muchas causas. A veces, la causa del dolor plvico no se conoce. Siga estas indicaciones en su casa:   Tome los medicamentos de venta libre y los recetados solamente como se lo haya indicado el mdico.  Haga reposo como se lo haya indicado el mdico.  No tenga relaciones sexuales si siente dolor.  Lleve un registro del dolor plvico. Escriba los siguientes datos: ? Cundo Radiation protection practitionercomenz el dolor. ? La ubicacin del dolor. ? Qu parece mejorar o empeorar el dolor, como alimentos o el perodo (ciclo menstrual). ? Cualquier sntoma que se presente junto con Chief Technology Officerel dolor.  Concurra a todas las visitas de control como se lo haya indicado el mdico. Esto es importante. Comunquese con un mdico si:  Los medicamentos no Tourist information centre managerle alivian el dolor.  El dolor regresa.  Aparecen nuevos sntomas.  Tiene sangrado o secrecin inusual de la vagina.  Tiene fiebre o escalofros.  Tiene dificultad para defecar (estreimiento).  Observa sangre en el pis (orina) o en la materia fecal (heces).  El pis tiene mal olor.  Se siente dbil o siente que va a desvanecerse. Solicite ayuda inmediatamente si:  Freight forwarderiente un dolor repentino y Optometristmuy intenso.  El dolor es cada Transport plannervez peor.  Siente un dolor muy intenso y tambin tiene alguno de estos sntomas: ? Grant RutsFiebre. ? Malestar estomacal (nuseas). ? Vmitos. ? Tiene mucho sudor.  Se desmaya (pierde el conocimiento). Resumen  El dolor plvico se siente en la parte inferior del vientre (abdomen), debajo del ombligo y a nivel de las caderas.  Hay muchas causas posibles del dolor  plvico.  Lleve un registro del dolor plvico. Esta informacin no tiene Theme park managercomo fin reemplazar el consejo del mdico. Asegrese de hacerle al mdico cualquier pregunta que tenga. Document Released: 02/03/2012 Document Revised: 03/25/2018 Document Reviewed: 03/25/2018 Elsevier Patient Education  2020 Elsevier Inc.       Edwina BarthMiguel Ranard Harte, MD [facility]

## 2019-04-07 NOTE — Telephone Encounter (Signed)
Pt went to pick up her Rx for traMADol (ULTRAM) 50 MG tablet  But the pharmacist advised her to check with the Dr. Due to the fact that she is still breast feeding. / please advise

## 2019-04-07 NOTE — Patient Instructions (Addendum)
     If you have lab work done today you will be contacted with your lab results within the next 2 weeks.  If you have not heard from [place] then please contact us. The fastest way to get your results is to register for My Chart.   IF you received an x-ray today, you will receive an invoice from [facility]. Please contact [facility] at [phone] with questions or concerns regarding your invoice.   IF you received labwork today, you will receive an invoice from [facility]. Please contact [facility] at [phone] with questions or concerns regarding your invoice.   Our billing staff will not be able to assist you with questions regarding bills from these companies.  You will be contacted with the lab results as soon as they are available. The fastest way to get your results is to activate your My Chart account. Instructions are located on the last page of this paperwork. If you have not heard from [place] regarding the results in 2 weeks, please contact this office.     Dolor plvico en la mujer Pelvic Pain, Female El dolor plvico se siente en la parte inferior del vientre (abdomen), debajo del ombligo y a nivel de las caderas. El dolor puede comenzar en forma repentina (ser [employer]), reaparecer (ser recurrente) o durar mucho tiempo (volverse crnico). El dolor plvico que dura ms de 6 meses se denomina dolor plvico crnico. El dolor plvico puede tener muchas causas. A veces, la causa del dolor plvico no se conoce. Siga estas indicaciones en su casa:   Tome los medicamentos de venta libre y los recetados solamente como se lo haya indicado el mdico.  Haga reposo como se lo haya indicado el mdico.  No tenga relaciones sexuales si siente dolor.  Lleve un registro del dolor plvico. Escriba los siguientes datos: ? Cundo Energy manager. ? La ubicacin del dolor. ? Qu parece mejorar o empeorar el dolor, como alimentos o el perodo (ciclo menstrual). ? Cualquier sntoma  que se presente junto con Conservation officer, historic buildings.  Concurra a todas las visitas de control como se lo haya indicado el mdico. Esto es importante. Comunquese con un mdico si:  Los medicamentos no Forensic psychologist.  El dolor regresa.  Aparecen nuevos sntomas.  Tiene sangrado o secrecin inusual de la vagina.  Tiene fiebre o escalofros.  Tiene dificultad para defecar (estreimiento).  Observa sangre en el pis (orina) o en la materia fecal (heces).  El pis tiene mal olor.  Se siente dbil o siente que va a desvanecerse. Solicite ayuda inmediatamente si:  Technical brewer repentino y Wellsite geologist.  El dolor es cada Producer, television/film/video.  Siente un dolor muy intenso y tambin tiene alguno de estos sntomas: ? Cristy Hilts. ? Malestar estomacal (nuseas). ? Vmitos. ? Tiene mucho sudor.  Se desmaya (pierde el conocimiento). Resumen  El dolor plvico se siente en la parte inferior del vientre (abdomen), debajo del ombligo y a nivel de las caderas.  Hay muchas causas posibles del dolor plvico.  Lleve un registro del dolor plvico. Esta informacin no tiene Marine scientist el consejo del mdico. Asegrese de hacerle al mdico cualquier pregunta que tenga. Document Released: 02/03/2012 Document Revised: 03/25/2018 Document Reviewed: 03/25/2018 Elsevier Patient Education  2020 [employer].

## 2019-04-11 NOTE — Telephone Encounter (Signed)
Dr Sagardia please advise. Dgaddy, CMA 

## 2019-04-11 NOTE — Telephone Encounter (Signed)
Better to just take Tylenol as needed.  Safer with breast-feeding.  Thanks.

## 2019-04-20 NOTE — Telephone Encounter (Signed)
[place] # [phone] advised pt it is better to take tylenol as needed.  Safer with breastfeeding.  Pt agreeable but advises she has not heard anything from ob/gyn re: ultrasound for pelvic pain. Referral placed on 8/20 with [facility] ob/gyn and pt placed in waiting que.  Pt says she take ibuprofen at night to help with pain at night so she can sleep.  I did to take the ibuprofen sparingly.  Pt advises she does.   Dgaddy, CMA

## 2019-05-23 ENCOUNTER — Ambulatory Visit: Payer: No Typology Code available for payment source | Admitting: Obstetrics & Gynecology

## 2019-05-24 ENCOUNTER — Ambulatory Visit
Admission: RE | Admit: 2019-05-24 | Discharge: 2019-05-24 | Disposition: A | Discharge status: Home / Self Care | Payer: No Typology Code available for payment source | Source: Ambulatory Visit | Attending: Emergency Medicine | Admitting: Emergency Medicine

## 2019-05-24 DIAGNOSIS — R102 Pelvic and perineal pain: Secondary | ICD-10-CM

## 2019-06-14 ENCOUNTER — Ambulatory Visit: Payer: Self-pay | Admitting: Obstetrics & Gynecology

## 2019-06-30 ENCOUNTER — Other Ambulatory Visit (HOSPITAL_COMMUNITY): Payer: Self-pay | Admitting: *Deleted

## 2019-06-30 ENCOUNTER — Other Ambulatory Visit: Payer: Self-pay

## 2019-06-30 ENCOUNTER — Ambulatory Visit (INDEPENDENT_AMBULATORY_CARE_PROVIDER_SITE_OTHER): Payer: Self-pay | Admitting: Obstetrics & Gynecology

## 2019-06-30 ENCOUNTER — Encounter: Payer: Self-pay | Admitting: Obstetrics & Gynecology

## 2019-06-30 VITALS — BP 117/73 | HR 80 | Wt 179.0 lb

## 2019-06-30 DIAGNOSIS — Z113 Encounter for screening for infections with a predominantly sexual mode of transmission: Secondary | ICD-10-CM

## 2019-06-30 DIAGNOSIS — N898 Other specified noninflammatory disorders of vagina: Secondary | ICD-10-CM

## 2019-06-30 DIAGNOSIS — B3731 Acute candidiasis of vulva and vagina: Secondary | ICD-10-CM

## 2019-06-30 DIAGNOSIS — R102 Pelvic and perineal pain: Secondary | ICD-10-CM

## 2019-06-30 DIAGNOSIS — Z1231 Encounter for screening mammogram for malignant neoplasm of breast: Secondary | ICD-10-CM

## 2019-06-30 DIAGNOSIS — B373 Candidiasis of vulva and vagina: Secondary | ICD-10-CM

## 2019-06-30 NOTE — Patient Instructions (Signed)
Pelvic Pain, Female Pelvic pain is pain in your lower abdomen, below your belly button and between your hips. The pain may start suddenly (be acute), keep coming back (be recurring), or last a long time (become chronic). Pelvic pain that lasts longer than 6 months is considered chronic. Pelvic pain may affect your:  Reproductive organs.  Urinary system.  Digestive tract.  Musculoskeletal system. There are many potential causes of pelvic pain. Sometimes, the pain can be a result of digestive or urinary conditions, strained muscles or ligaments, or reproductive conditions. Sometimes the cause of pelvic pain is not known. Follow these instructions at home:   Take over-the-counter and prescription medicines only as told by your health care provider.  Rest as told by your health care provider.  Do not have sex if it hurts.  Keep a journal of your pelvic pain. Write down: ? When the pain started. ? Where the pain is located. ? What seems to make the pain better or worse, such as food or your period (menstrual cycle). ? Any symptoms you have along with the pain.  Keep all follow-up visits as told by your health care provider. This is important. Contact a health care provider if:  Medicine does not help your pain.  Your pain comes back.  You have new symptoms.  You have abnormal vaginal discharge or bleeding, including bleeding after menopause.  You have a fever or chills.  You are constipated.  You have blood in your urine or stool.  You have foul-smelling urine.  You feel weak or light-headed. Get help right away if:  You have sudden severe pain.  Your pain gets steadily worse.  You have severe pain along with fever, nausea, vomiting, or excessive sweating.  You lose consciousness. Summary  Pelvic pain is pain in your lower abdomen, below your belly button and between your hips.  There are many potential causes of pelvic pain.  Keep a journal of your pelvic  pain. This information is not intended to replace advice given to you by your health care provider. Make sure you discuss any questions you have with your health care provider. Document Released: 07/01/2004 Document Revised: 01/20/2018 Document Reviewed: 01/20/2018 Elsevier Patient Education  2020 Elsevier Inc.  

## 2019-06-30 NOTE — Progress Notes (Signed)
GYNECOLOGY OFFICE VISIT NOTE  History:   Barbara Coffey is a 43 y.o. [phone] here today for evaluation of right sided pelvic pain for several months. Reports pain is present before her period, but no pain during her period or afterwards. No pain during intercourse. No pain during urinary or bowel habits, no relation to food. No current pain.  Alleviated by pain medications. Was seen by her PCP, she had an ultrasound last month that showed no anatomic abnormalities. She denies any current abnormal vaginal discharge, bleeding, pelvic pain or other concerns.    Past Medical History:  Diagnosis Date  . No pertinent past medical history     Past Surgical History:  Procedure Laterality Date  . left ovarian cyst    . TUBAL LIGATION Bilateral 11/01/2016   Procedure: POST PARTUM TUBAL LIGATION;  Surgeon: Lavonia Drafts, MD;  Location: [facility];  Service: Gynecology;  Laterality: Bilateral;    The following portions of the patient's history were reviewed and updated as appropriate: allergies, current medications, past family history, past medical history, past social history, past surgical history and problem list.   Health Maintenance:  Normal pap and negative HRHPV in 2017  Normal mammogram many years ago.   Review of Systems:  Pertinent items noted in HPI and remainder of comprehensive ROS otherwise negative.  Physical Exam:  BP 117/73   Pulse 80   Wt 179 lb (81.2 kg)   LMP 06/11/2019 (Exact Date)   BMI 33.82 kg/m  CONSTITUTIONAL: Well-developed, well-nourished female in no acute distress.  HEENT:  Normocephalic, atraumatic. External right and left ear normal. No scleral icterus.  NECK: Normal range of motion, supple, no masses noted on observation SKIN: No rash noted. Not diaphoretic. No erythema. No pallor. MUSCULOSKELETAL: Normal range of motion. No edema noted. NEUROLOGIC: Alert and oriented to person, place, and time. Normal muscle tone coordination. No cranial  nerve deficit noted. PSYCHIATRIC: Normal mood and affect. Normal behavior. Normal judgment and thought content. CARDIOVASCULAR: Normal heart rate noted RESPIRATORY: Effort and breath sounds normal, no problems with respiration noted ABDOMEN: No masses noted. No other overt distention noted.  No tenderness to palpation in lower abdomen PELVIC: Normal appearing external genitalia; normal appearing vaginal mucosa and cervix.  Scant white discharge noted, testing sample obtained.  Normal uterine size, no other palpable masses, mild diffuse uterine and adnexal tenderness.  Imaging US Pelvis Complete  Result Date: 05/24/2019 CLINICAL DATA:  RIGHT lower quadrant pain for months, history of LEFT ovarian cyst 15 years ago EXAM: TRANSABDOMINAL ULTRASOUND OF PELVIS TECHNIQUE: Transabdominal ultrasound examination of the pelvis was performed including evaluation of the uterus, ovaries, adnexal regions, and pelvic cul-de-sac. Transvaginal imaging was not ordered. COMPARISON:  01/28/2011 FINDINGS: Uterus Measurements: 8.6 x 4.0 x 5.7 cm = volume: 102 mL. Anteverted. Normal morphology without mass Endometrium Thickness: 7 mm.  No endometrial fluid or focal abnormality Right ovary Measurements: 2.9 x 1.9 x 1.9 cm = volume: 5.6 mL. Dominant follicle without mass Left ovary Measurements: 2.8 x 1.9 x 2.0 cm = volume: 5.6 mL. Normal morphology without mass Other findings: No free pelvic fluid. No adnexal masses. Visualized urinary bladder unremarkable. IMPRESSION: Normal exam. Electronically Signed   By: Lavonia Dana M.D.   On: 05/24/2019 15:30       Assessment and Plan:    1. Pelvic pain in female Reviewed ultrasound results. Unlikely to be endometriosis given recent initiation of symptoms. She could just have pre-menstrual pain;  advised taking NSAIDs prior to menstrual cycles  for pain.  She is reassured that no cyst or other anomaly was found.  Infection screen done, will follow up results and manage accordingly. -  Cervicovaginal ancillary only Told to call/come in for worsening symptoms. Routine preventative health maintenance measures emphasized; referred to [facility] for pap and mammogram service. Please refer to After Visit Summary for other counseling recommendations.   Return for any gynecologic concerns.    Total face-to-face time with patient: 15 minutes.  Over 50% of encounter was spent on counseling and coordination of care.   Jaynie Collins, MD, FACOG Obstetrician & Gynecologist, [facility] for [employer], [facility]

## 2019-07-01 LAB — CERVICOVAGINAL ANCILLARY ONLY
Bacterial Vaginitis (gardnerella): NEGATIVE
Candida Glabrata: NEGATIVE
Candida Vaginitis: POSITIVE — AB
Chlamydia: NEGATIVE
Comment: NEGATIVE
Comment: NEGATIVE
Comment: NEGATIVE
Comment: NEGATIVE
Comment: NEGATIVE
Comment: NORMAL
Neisseria Gonorrhea: NEGATIVE
Trichomonas: NEGATIVE

## 2019-07-03 MED ORDER — FLUCONAZOLE 150 MG PO TABS: 150.0000 mg | ORAL_TABLET | Freq: Once | ORAL | 3 refills | Status: AC

## 2019-07-03 NOTE — Addendum Note (Signed)
Addended by: Verita Schneiders A on: 07/03/2019 09:36 PM   Modules accepted: Orders

## 2019-07-04 ENCOUNTER — Telehealth: Payer: Self-pay | Admitting: *Deleted

## 2019-07-04 NOTE — Telephone Encounter (Signed)
Called pt using [employer], pt informed of her results and medication sent into the pharmacy.

## 2019-07-04 NOTE — Telephone Encounter (Signed)
-----   Message from Osborne Oman, MD sent at 07/03/2019  9:36 PM EST ----- Vaginal discharge test is abnormal and showed yeast infection.  Diflucan was prescribed. Please inform patient of results and advise to pick up prescription.  Spanish speaking.

## 2019-08-30 ENCOUNTER — Ambulatory Visit
Admission: RE | Admit: 2019-08-30 | Discharge: 2019-08-30 | Disposition: A | Discharge status: Home / Self Care | Payer: No Typology Code available for payment source | Source: Ambulatory Visit | Attending: Obstetrics and Gynecology | Admitting: Obstetrics and Gynecology

## 2019-08-30 ENCOUNTER — Encounter (HOSPITAL_COMMUNITY): Payer: Self-pay

## 2019-08-30 ENCOUNTER — Ambulatory Visit (HOSPITAL_COMMUNITY)
Admission: RE | Admit: 2019-08-30 | Discharge: 2019-08-30 | Disposition: A | Discharge status: Home / Self Care | Payer: Self-pay | Source: Ambulatory Visit | Attending: Obstetrics and Gynecology | Admitting: Obstetrics and Gynecology

## 2019-08-30 ENCOUNTER — Other Ambulatory Visit: Payer: Self-pay

## 2019-08-30 DIAGNOSIS — Z1231 Encounter for screening mammogram for malignant neoplasm of breast: Secondary | ICD-10-CM

## 2019-08-30 DIAGNOSIS — Z1239 Encounter for other screening for malignant neoplasm of breast: Secondary | ICD-10-CM

## 2019-08-30 NOTE — Progress Notes (Signed)
No complaints today.   Pap Smear: Pap smear not completed today. Last Pap smear was 04/17/2016 at the [facility] and normal with negative HPV. Per patient has no history of an abnormal Pap smear. Last Pap smear result is in Epic.  Physical exam: Breasts Breasts symmetrical. No skin abnormalities bilateral breasts. No nipple retraction bilateral breasts. No nipple discharge bilateral breasts. No lymphadenopathy. No lumps palpated bilateral breasts. No complaints of pain or tenderness on exam. Referred patient to the [facility] for a screening mammogram. Appointment scheduled for Tuesday, August 30, 2019 at 1430.        Pelvic/Bimanual No Pap smear completed today since last Pap smear and HPV typing was 04/17/2016. Pap smear not indicated per BCCCP guidelines.   Smoking History: Patient has never smoked.  Patient Navigation: Patient education provided. Access to services provided for patient through BCCCP program.   Breast and Cervical Cancer Risk Assessment: Patient has no family history of breast cancer, known genetic mutations, or radiation treatment to the chest before age 60. Patient has no history of cervical dysplasia, immunocompromised, or DES exposure in-utero.   Risk Assessment    Risk Scores      08/30/2019   Last edited by: Priscille Heidelberg, RN   5-year risk: 0.6 %   Lifetime risk: 6.7 %

## 2019-08-30 NOTE — Patient Instructions (Signed)
Explained breast self awareness with Gilles Chiquito. Patient did not need a Pap smear today due to last Pap smear and HPV typing was 04/17/2016. Let her know BCCCP will cover Pap smears and HPV typing every 5 years unless has a history of abnormal Pap smears. Referred patient to the [facility] for a screening mammogram. Appointment scheduled for Tuesday, August 30, 2019 at 1430. Patient aware of appointment and will be there. Let patient know the Breast Center will follow up with her within the next couple weeks with results of her mammogram by letter or phone. Gilles Chiquito verbalized understanding.  Zayvier Caravello, Kathaleen Maser, RN 12:50 PM

## 2019-09-29 ENCOUNTER — Encounter: Payer: Self-pay | Admitting: Family Medicine

## 2019-09-29 ENCOUNTER — Other Ambulatory Visit: Payer: Self-pay

## 2019-09-29 ENCOUNTER — Ambulatory Visit (INDEPENDENT_AMBULATORY_CARE_PROVIDER_SITE_OTHER): Payer: Self-pay | Admitting: Family Medicine

## 2019-09-29 VITALS — BP 119/78 | HR 77 | Temp 98.4°F | Ht 61.0 in | Wt 182.4 lb

## 2019-09-29 DIAGNOSIS — K644 Residual hemorrhoidal skin tags: Secondary | ICD-10-CM

## 2019-09-29 MED ORDER — HYDROCORTISONE (PERIANAL) 2.5 % EX CREA: 1.0000 "application " | TOPICAL_CREAM | Freq: Two times a day (BID) | CUTANEOUS | 2 refills | Status: DC

## 2019-09-29 NOTE — Patient Instructions (Addendum)
If you have lab work done today you will be contacted with your lab results within the next 2 weeks.  If you have not heard from [place] then please contact us. The fastest way to get your results is to register for My Chart.   IF you received an x-ray today, you will receive an invoice from [facility]. Please contact [facility] at [phone] with questions or concerns regarding your invoice.   IF you received labwork today, you will receive an invoice from [place]. Please contact [facility] at [phone] with questions or concerns regarding your invoice.   Our billing staff will not be able to assist you with questions regarding bills from these companies.  You will be contacted with the lab results as soon as they are available. The fastest way to get your results is to activate your My Chart account. Instructions are located on the last page of this paperwork. If you have not heard from [place] regarding the results in 2 weeks, please contact this office.        If you have lab work done today you will be contacted with your lab results within the next 2 weeks.  If you have not heard from [place] then please contact us. The fastest way to get your results is to register for My Chart.   IF you received an x-ray today, you will receive an invoice from [facility]. Please contact [facility] at [phone] with questions or concerns regarding your invoice.   IF you received labwork today, you will receive an invoice from [place]. Please contact [facility] at [phone] with questions or concerns regarding your invoice.   Our billing staff will not be able to assist you with questions regarding bills from these companies.  You will be contacted with the lab results as soon as they are available. The fastest way to get your results is to activate your My Chart account. Instructions are located on the last page of this paperwork. If you have not heard from [place]  regarding the results in 2 weeks, please contact this office.     Hemorroides Hemorrhoids Las hemorroides son venas inflamadas que pueden desarrollarse:  En el ano (recto). Estas se denominan hemorroides internas.  Alrededor de la abertura del ano. Estas se denominan hemorroides externas. Las hemorroides pueden causar dolor, picazn o hemorragias. Generalmente no causan problemas graves. Con frecuencia mejoran al [employer] dieta, el estilo de vida y otros tratamientos Financial planner. Cules son las causas? Esta afeccin puede ser causada por lo siguiente:  Tener dificultad para defecar (estreimiento).  Hacer mucha fuerza (esfuerzo) para defecar.  Materia fecal lquida (diarrea).  Embarazo.  Tener mucho sobrepeso (obesidad).  Estar sentado durante largos perodos de [place].  Levantar objetos pesados u otras actividades que impliquen esfuerzo.  Sexo anal.  Andar en bicicleta por un largo perodo de tiempo. Cules son los signos o los sntomas? Los sntomas de esta afeccin incluyen los siguientes:  Social research officer, government.  Picazn o irritacin en el ano.  Sangrado proveniente del ano.  Prdida de materia fecal.  Inflamacin en la zona.  Uno o ms bultos alrededor de la abertura del ano. Cmo se diagnostica? A menudo un mdico puede diagnosticar esta afeccin al observar la zona afectada. El mdico tambin puede:  Optometrist un examen que implica palpar la zona con la mano enguantada (examen rectal digital).  Examinar el interior de la zona anal utilizando un pequeo tubo (anoscopio).  Pedir anlisis de [place]. Es  posible que esto se realice si ha perdido [employer].  Solicitarle un estudio que consiste en la observacin del interior del colon utilizando un tubo flexible con una cmara en el extremo (sigmoidoscopia o colonoscopa). Cmo se trata? Esta afeccin generalmente se puede tratar en el hogar. El mdico puede indicarle que cambie de Paediatric nurse, de estilo de vida o  que trate de Psychologist, sport and exercise. Si esto no da resultado, se pueden realizar procedimientos para extirpar las hemorroides o reducir Brewing technologist. Estos pueden implicar lo siguiente:  Museum/gallery conservator en la base de las hemorroides para interrumpir la irrigacin de Risk manager.  Inyectar un medicamento en las hemorroides para reducir Brewing technologist.  Dirigir un tipo de Engineer, drilling de luz hacia las hemorroides para Radio producer que se caigan.  Realizar Neomia Dear ciruga para extirpar las hemorroides o cortar la irrigacin de [place]. Siga estas indicaciones en su casa: Comida y bebida   Consuma alimentos con alto contenido de [place]. Entre ellos cereales integrales, frijoles, frutos secos, frutas y verduras.  Pregntele a su mdico acerca de tomar productos con fibra aadida en ellos (complementos defibra).  Disminuya la cantidad de grasa de la dieta. Para esto, puede hacer lo siguiente: ? Coma productos lcteos descremados. ? Coma menos carne roja. ? No consuma alimentos procesados.  Beba suficiente lquido para Photographer orina de color amarillo plido. Control del dolor y la hinchazn   Tome un bao de agua tibia (bao de asiento) durante 20 minutos para Engineer, materials. Hgalo 3 o 4veces al da. Puede hacer esto en una baera o usar un dispositivo porttil para bao de asiento que se coloca sobre el inodoro.  Si se lo indican, aplique hielo sobre la zona dolorida. Puede ser beneficioso aplicar hielo [employer] baos con agua tibia. ? Ponga el hielo en una bolsa plstica. ? Coloque una [employer] piel y Copy. ? Coloque el hielo durante , 2 a 3veces por da. Indicaciones generales  [employer] de venta libre y los recetados solamente como se lo haya indicado el mdico. ? Las Health Net y los medicamentos pueden usarse como se lo hayan indicado.  Haga ejercicio fsico con frecuencia. Consulte al mdico qu tipos de ejercicios son mejores para  usted y qu cantidad.  Vaya al bao cuando sienta ganas de defecar. No espere.  Evite hacer demasiada fuerza al defecar.  Mantenga el ano seco y limpio. Use papel higinico hmedo o toallitas humedecidas despus de defecar.  No pase mucho tiempo sentado en el inodoro.  Concurra a todas las visitas de [address] se lo haya indicado el mdico. Esto es importante. Comunquese con un mdico si:  Tiene dolor e hinchazn que no mejoran con el tratamiento o los medicamentos.  Tiene problemas para defecar.  No puede defecar.  Tiene dolor o hinchazn en la zona exterior de las hemorroides. Solicite ayuda inmediatamente si tiene:  Hemorragia que no se detiene. Resumen  Las hemorroides son venas hinchadas en el ano o la zona que rodea el ano.  Pueden causar dolor, picazn o sangrado.  Consuma alimentos con alto contenido de [place]. Entre ellos cereales integrales, frijoles, frutos secos, frutas y verduras.  Tome un bao de agua tibia (bao de asiento) durante 20 minutos para Engineer, materials. Hgalo 3 o 4veces al da. Esta informacin no tiene Theme park manager el consejo del mdico. Asegrese de hacerle al mdico cualquier pregunta que tenga. Document Revised: 02/11/2018 Document Reviewed: 02/11/2018 Elsevier Patient Education  2020 Elsevier Inc.  

## 2019-09-29 NOTE — Progress Notes (Signed)
   2/11/20211:28 PM  Barbara Coffey 24-May-1976, 44 y.o., female [id number]  Chief Complaint  Patient presents with  . Hemorrhoids    for thr past 15 days whenever she eats, quickly eliminates. She says she has felt the hemorrhoids, no bleeding    HPI:   Patient is a 44 y.o. female who presents today for hemorrhoids  For past 2 weeks having frequent normal stools, specially after each meal, no abd pain, no nausea or vomiting, no black tarry stools, no bright red blood, this has caused hemorrhoid issues camomille tea and sitz baths have been helpful  Depression screen [facility] 2/9 09/29/2019 04/07/2019 09/04/2018  Decreased Interest 0 0 0  Down, Depressed, Hopeless 0 0 1  PHQ - 2 Score 0 0 1    Fall Risk  09/29/2019 04/07/2019 09/04/2018 06/10/2018 04/03/2018  Falls in the past year? 0 0 0 No No  Number falls in past yr: 0 - - - -  Injury with Fall? 0 - - - -  Follow up - Falls evaluation completed - - -     No Known Allergies  Prior to Admission medications   Not on File    Past Medical History:  Diagnosis Date  . No pertinent past medical history     Past Surgical History:  Procedure Laterality Date  . left ovarian cyst    . TUBAL LIGATION Bilateral 11/01/2016   Procedure: POST PARTUM TUBAL LIGATION;  Surgeon: Willodean Rosenthal, MD;  Location: [facility];  Service: Gynecology;  Laterality: Bilateral;    Social History   Tobacco Use  . Smoking status: Never Smoker  . Smokeless tobacco: Never Used  Substance Use Topics  . Alcohol use: No    Family History  Problem Relation Age of Onset  . Hypertension Father   . Hypertension Sister     ROS Per hpi  OBJECTIVE:  Today's Vitals   09/29/19 1318  BP: 119/78  Pulse: 77  Temp: 98.4 F (36.9 C)  SpO2: 97%  Weight: 182 lb 6.4 oz (82.7 kg)  Height: 5\' 1"  (1.549 m)   Body mass index is 34.46 kg/m.   Physical Exam Vitals and nursing note reviewed. Exam conducted with a chaperone present.    Constitutional:      Appearance: She is well-developed.  HENT:     Head: Normocephalic and atraumatic.  Eyes:     General: No scleral icterus.    Conjunctiva/sclera: Conjunctivae normal.     Pupils: Pupils are equal, round, and reactive to light.  Pulmonary:     Effort: Pulmonary effort is normal.  Genitourinary:    Rectum: External hemorrhoid present. No tenderness or anal fissure.  Musculoskeletal:     Cervical back: Neck supple.  Skin:    General: Skin is warm and dry.  Neurological:     Mental Status: She is alert and oriented to person, place, and time.     No results found for this or any previous visit (from the past 24 hour(s)).  No results found.   ASSESSMENT and PLAN  1. External hemorrhoid Discussed supportive measures, new meds r/se/b and RTC precautions. Patient educational handout given. Other orders - hydrocortisone (PROCTOSOL HC) 2.5 % rectal cream; Place 1 application rectally 2 (two) times daily.  Return if symptoms worsen or fail to improve.    , MD Primary Care at [facility] [address] [place], [place] Ph.  [phone] Fax [phone]

## 2019-10-20 ENCOUNTER — Encounter: Payer: Self-pay | Admitting: Family Medicine

## 2020-05-02 ENCOUNTER — Ambulatory Visit (INDEPENDENT_AMBULATORY_CARE_PROVIDER_SITE_OTHER): Payer: Self-pay | Admitting: Emergency Medicine

## 2020-05-02 ENCOUNTER — Other Ambulatory Visit: Payer: Self-pay

## 2020-05-02 ENCOUNTER — Encounter: Payer: Self-pay | Admitting: Emergency Medicine

## 2020-05-02 VITALS — BP 106/68 | HR 73 | Temp 98.3°F | Resp 16 | Ht 61.0 in | Wt 190.0 lb

## 2020-05-02 DIAGNOSIS — L259 Unspecified contact dermatitis, unspecified cause: Secondary | ICD-10-CM

## 2020-05-02 MED ORDER — CLOTRIMAZOLE-BETAMETHASONE 1-0.05 % EX CREA: 1.0000 "application " | TOPICAL_CREAM | Freq: Two times a day (BID) | CUTANEOUS | 1 refills | Status: DC

## 2020-05-02 NOTE — Progress Notes (Signed)
Barbara Coffey 45 y.o.   Chief Complaint  Patient presents with  . Rash    both hands for 4 weeks with burning and has white spots, also body itching    HISTORY OF PRESENT ILLNESS: This is a 44 y.o. female complaining of rash to both hands and fingers for the past month.  Has some intermittent generalized body itching.  No other symptoms. No history of chronic medical problems.  No history of diabetes. No other complaints or medical concerns today.  HPI   Prior to Admission medications   Medication Sig Start Date End Date Taking? Authorizing Provider  hydrocortisone (PROCTOSOL HC) 2.5 % rectal cream Place 1 application rectally 2 (two) times daily. 09/29/19  Yes Myles Lipps, MD    No Known Allergies  There are no problems to display for this patient.   Past Medical History:  Diagnosis Date  . No pertinent past medical history     Past Surgical History:  Procedure Laterality Date  . left ovarian cyst    . TUBAL LIGATION Bilateral 11/01/2016   Procedure: POST PARTUM TUBAL LIGATION;  Surgeon: Willodean Rosenthal, MD;  Location: [facility];  Service: Gynecology;  Laterality: Bilateral;    Social History   Socioeconomic History  . Marital status: Married    Spouse name: Not on file  . Number of children: Not on file  . Years of education: Not on file  . Highest education level: 9th grade  Occupational History  . Not on file  Tobacco Use  . Smoking status: Never Smoker  . Smokeless tobacco: Never Used  Vaping Use  . Vaping Use: Never used  Substance and Sexual Activity  . Alcohol use: No  . Drug use: No  . Sexual activity: Yes    Birth control/protection: Surgical    Comment: BTL  Other Topics Concern  . Not on file  Social History Narrative  . Not on file   Social Determinants of Health   Financial Resource Strain:   . Difficulty of Paying Living Expenses: Not on file  Food Insecurity:   . Worried About Programme researcher, broadcasting/film/video in the Last Year:  Not on file  . Ran Out of Food in the Last Year: Not on file  Transportation Needs: No Transportation Needs  . Lack of Transportation (Medical): No  . Lack of Transportation (Non-Medical): No  Physical Activity:   . Days of Exercise per Week: Not on file  . Minutes of Exercise per Session: Not on file  Stress:   . Feeling of Stress : Not on file  Social Connections:   . Frequency of Communication with Friends and Family: Not on file  . Frequency of Social Gatherings with Friends and Family: Not on file  . Attends Religious Services: Not on file  . Active Member of Clubs or Organizations: Not on file  . Attends Banker Meetings: Not on file  . Marital Status: Not on file  Intimate Partner Violence:   . Fear of Current or Ex-Partner: Not on file  . Emotionally Abused: Not on file  . Physically Abused: Not on file  . Sexually Abused: Not on file    Family History  Problem Relation Age of Onset  . Hypertension Father   . Hypertension Sister      Review of Systems  Constitutional: Negative.  Negative for chills and fever.       Weight gain  HENT: Negative.  Negative for congestion and sore throat.  Respiratory: Negative.  Negative for cough and shortness of breath.   Cardiovascular: Negative.  Negative for chest pain and palpitations.  Gastrointestinal: Negative for abdominal pain, diarrhea, nausea and vomiting.  Genitourinary: Negative.  Negative for dysuria and hematuria.  Musculoskeletal: Negative.  Negative for myalgias and neck pain.  Skin: Positive for itching and rash.  Neurological: Negative for dizziness and headaches.  All other systems reviewed and are negative.  Today's Vitals   05/02/20 1117  BP: 106/68  Pulse: 73  Resp: 16  Temp: 98.3 F (36.8 C)  TempSrc: Temporal  SpO2: 96%  Weight: 190 lb (86.2 kg)  Height: 5\' 1"  (1.549 m)   Body mass index is 35.9 kg/m.   Physical Exam Vitals reviewed.  Constitutional:      Appearance: Normal  appearance.  HENT:     Head: Normocephalic.  Eyes:     Extraocular Movements: Extraocular movements intact.     Pupils: Pupils are equal, round, and reactive to light.  Cardiovascular:     Rate and Rhythm: Normal rate and regular rhythm.     Pulses: Normal pulses.     Heart sounds: Normal heart sounds.  Pulmonary:     Effort: Pulmonary effort is normal.     Breath sounds: Normal breath sounds.  Musculoskeletal:     Cervical back: Normal range of motion and neck supple.  Skin:    General: Skin is warm and dry.     Capillary Refill: Capillary refill takes less than 2 seconds.     Comments: Hands: Some eczema noted to both hands  Neurological:     General: No focal deficit present.     Mental Status: She is alert and oriented to person, place, and time.  Psychiatric:        Mood and Affect: Mood normal.        Behavior: Behavior normal.      ASSESSMENT & PLAN: Barbara Coffey was seen today for rash.  Diagnoses and all orders for this visit:  Contact dermatitis, unspecified contact dermatitis type, unspecified trigger -     CBC with Differential/Platelet -     Comprehensive metabolic panel -     Hemoglobin A1c -     TSH -     clotrimazole-betamethasone (LOTRISONE) cream; Apply 1 application topically 2 (two) times daily.    Patient Instructions       If you have lab work done today you will be contacted with your lab results within the next 2 weeks.  If you have not heard from [place] Fitz then please contact us. The fastest way to get your results is to register for My Chart.   IF you received an x-ray today, you will receive an invoice from [facility]. Please contact [facility]iology at [phone] with questions or concerns regarding your invoice.   IF you received labwork today, you will receive an invoice from [place]. Please contact [facility] at [phone] with questions or concerns regarding your invoice.   Our billing staff will not be able to assist you  with questions regarding bills from these companies.  You will be contacted with the lab results as soon as they are available. The fastest way to get your results is to activate your My Chart account. Instructions are located on the last page of this paperwork. If you have not heard from [phone] regarding the results in 2 weeks, please contact this office.     Dermatitis de contacto Contact Dermatitis La dermatitis es el enrojecimiento, el dolor  y la hinchazn (inflamacin) de la piel. La dermatitis de contacto es una reaccin a algo que toca la piel. Hay dos tipos de dermatitis de contacto:  Dermatitis de contacto irritativa. Esto ocurre cuando algo molesta (irrita) la piel, [place].  Dermatitis de contacto alrgica. Esto se produce cuando una persona se expone a algo a lo que es Counselling psychologist, como la hiedra venenosa. Cules son las causas?  Las causas frecuentes de la dermatitis de contacto irritante incluyen las siguientes: ? [place]. ? Jabones. ? Detergentes. ? [place]. ? cidos. ? Metales, como el nquel.  Las causas frecuentes de la dermatitis de contacto alrgica incluyen las siguientes: ? Plantas. ? Productos qumicos. ? Alhajas. ? Ltex. ? Medicamentos. ? Conservantes que se utilizan en determinados productos, como la ropa. Qu incrementa el riesgo?  Tener un trabajo que lo expone a cosas que causan molestias en la piel.  Tener asma o eczema. Cules son los signos o los sntomas? Los sntomas pueden ocurrir en cualquier parte de la piel que la sustancia irritante haya tocado. Algunos de los sntomas son los siguientes:  Piel seca o descamada.  Enrojecimiento.  Grietas.  Picazn.  Dolor o sensacin de ardor.  Ampollas.  Sangre o lquido transparente que drena de las grietas de la piel. En el caso de la dermatitis de Engineer, technical sales, puede haber hinchazn. Esto puede ocurrir en [employer] prpados, la boca o los genitales. Cmo se  trata?  Esta afeccin se trata mediante un control para detectar la causa de la reaccin y proteger la piel. El tratamiento tambin puede incluir lo siguiente: ? Ungentos, medicamentos o cremas con corticoesteroides. ? Antibiticos u otros ungentos, si tiene una infeccin en la piel. ? Lociones o medicamentos para Associate Professor. ? Una venda (vendaje). Siga estas indicaciones en su casa: Cuidado de la piel  Humctese la piel segn sea necesario.  Aplique compresas fras sobre la piel.  Pngase una pasta de bicarbonato de [employer]. Agregue agua al bicarbonato de sodio hasta que parezca una pasta.  No se rasque la piel.  Evite que las cosas le rocen la piel.  Evite el uso de [place], perfumes y tintes. Medicamentos  Tome o aplique los medicamentos de venta libre y los recetados solamente como se lo haya indicado el mdico.  Si le recetaron un antibitico, tmelo o aplquelo como se lo haya indicado el mdico. No deje de usarlo aunque la afeccin empiece a mejorar. Baarse  Tome un bao con: ? Sales de [place]. ? Bicarbonato de sodio. ? Avena coloidal.  Bese con menos frecuencia.  Bese con agua tibia. No use agua caliente. Cuidado de la venda  Si le colocaron una venda, [place] como se lo haya indicado el mdico.  [place] las manos con agua y [place] antes y despus de cambiarse la venda. Use desinfectante para manos si no dispone de [place] y [place]. Indicaciones generales  Evite las cosas que le causaron la reaccin. Si no sabe qu la caus, lleve un diario. Escriba los siguientes datos: ? Lo que come. ? Los productos para la piel que [place]. ? Lo que bebe. ? Lo que lleva puesto en la zona que tiene los sntomas. Esto incluye las alhajas.  Controle todos los das las zonas afectadas para detectar signos de infeccin. Est atento a los siguientes signos: ? Aumento del enrojecimiento, la hinchazn o Chief Technology Officer. ? Ms lquido [place]. ? Calor. ? Pus o mal  olor.  Concurra a todas las  visitas de seguimiento como se lo haya indicado el mdico. Esto es importante. Comunquese con un mdico si:  No mejora con el tratamiento.  Su afeccin empeora.  Tiene signos de infeccin, como los siguientes: ? Aumento de la hinchazn. ? Dolor a Insurance claims handlerla palpacin. ? Aumento del enrojecimiento. ? Molestias. ? Calor.  Tiene fiebre.  Aparecen nuevos sntomas. Solicite ayuda inmediatamente si:  Tiene un dolor de cabeza muy intenso.  Siente dolor en el cuello.  Tiene el cuello rgido.  Vomita.  Se siente muy somnoliento.  Nota unas lneas rojas en la piel que salen de la zona.  El hueso o la articulacin que se encuentran cerca de la zona le duelen despus de que la piel se [place].  La zona se oscurece.  Tiene dificultad para respirar. Resumen  La dermatitis es el enrojecimiento, el dolor y la hinchazn de la piel.  Los sntomas pueden ocurrir en donde la sustancia irritante lo ha tocado.  El tratamiento puede incluir medicamentos y cuidado de la piel.  Si no conoce la causa de Radiation protection practitionerla reaccin alrgica, lleve un diario.  Comunquese con un mdico si su afeccin empeora o si tiene signos de infeccin. Esta informacin no tiene Theme park managercomo fin reemplazar el consejo del mdico. Asegrese de hacerle al mdico cualquier pregunta que tenga. Document Revised: 04/07/2018 Document Reviewed: 04/07/2018 Elsevier Patient Education  2020 Elsevier Inc.      Edwina BarthMiguel Jolyne Laye, MD [facility]

## 2020-05-02 NOTE — Patient Instructions (Addendum)
If you have lab work done today you will be contacted with your lab results within the next 2 weeks.  If you have not heard from [place] then please contact us. The fastest way to get your results is to register for My Chart.   IF you received an x-ray today, you will receive an invoice from [facility]. Please contact [facility] at [phone] with questions or concerns regarding your invoice.   IF you received labwork today, you will receive an invoice from [place]. Please contact [facility] at [phone] with questions or concerns regarding your invoice.   Our billing staff will not be able to assist you with questions regarding bills from these companies.  You will be contacted with the lab results as soon as they are available. The fastest way to get your results is to activate your My Chart account. Instructions are located on the last page of this paperwork. If you have not heard from [place] regarding the results in 2 weeks, please contact this office.     Dermatitis de contacto Contact Dermatitis La dermatitis es el enrojecimiento, el dolor y la hinchazn (inflamacin) de la piel. La dermatitis de contacto es una reaccin a algo que toca la piel. Hay dos tipos de dermatitis de contacto:  Dermatitis de contacto irritativa. Esto ocurre cuando algo molesta (irrita) la piel, [place].  Dermatitis de contacto alrgica. Esto se produce cuando una persona se expone a algo a lo que es Counselling psychologist, como la hiedra venenosa. Cules son las causas?  Las causas frecuentes de la dermatitis de contacto irritante incluyen las siguientes: ? Maquillaje. ? Jabones. ? Detergentes. ? [place]andina. ? cidos. ? Metales, como el nquel.  Las causas frecuentes de la dermatitis de contacto alrgica incluyen las siguientes: ? Plantas. ? Productos qumicos. ? Alhajas. ? Ltex. ? Medicamentos. ? Conservantes que se utilizan en determinados productos, como la ropa. Qu incrementa  el riesgo?  Tener un trabajo que lo expone a cosas que causan molestias en la piel.  Tener asma o eczema. Cules son los signos o los sntomas? Los sntomas pueden ocurrir en cualquier parte de la piel que la sustancia irritante haya tocado. Algunos de los sntomas son los siguientes:  Piel seca o descamada.  Enrojecimiento.  Grietas.  Picazn.  Dolor o sensacin de ardor.  Ampollas.  Sangre o lquido transparente que drena de las grietas de la piel. En el caso de la dermatitis de Engineer, technical sales, puede haber hinchazn. Esto puede ocurrir en [employer] prpados, la boca o los genitales. Cmo se trata?  Esta afeccin se trata mediante un control para detectar la causa de la reaccin y proteger la piel. El tratamiento tambin puede incluir lo siguiente: ? Ungentos, medicamentos o cremas con corticoesteroides. ? Antibiticos u otros ungentos, si tiene una infeccin en la piel. ? Lociones o medicamentos para Associate Professor. ? Una venda (vendaje). Siga estas indicaciones en su casa: Cuidado de la piel  Humctese la piel segn sea necesario.  Aplique compresas fras sobre la piel.  Pngase una pasta de bicarbonato de [employer]. Agregue agua al bicarbonato de sodio hasta que parezca una pasta.  No se rasque la piel.  Evite que las cosas le rocen la piel.  Evite el uso de [place], perfumes y tintes. Medicamentos  Tome o aplique los medicamentos de venta libre y los recetados solamente como se lo haya indicado el mdico.  Si le recetaron un antibitico, tmelo o aplquelo  como se lo haya indicado el mdico. No deje de usarlo aunque la afeccin empiece a mejorar. Baarse  Tome un bao con: ? Sales de [place]. ? Bicarbonato de sodio. ? Avena coloidal.  Bese con menos frecuencia.  Bese con agua tibia. No use agua caliente. Cuidado de la venda  Si le colocaron una venda, [place] como se lo haya indicado el mdico.  [place] las manos con  agua y [place] antes y despus de cambiarse la venda. Use desinfectante para manos si no dispone de [place] y [place]. Indicaciones generales  Evite las cosas que le causaron la reaccin. Si no sabe qu la caus, lleve un diario. Escriba los siguientes datos: ? Lo que come. ? Los productos para la piel que [place]. ? Lo que bebe. ? Lo que lleva puesto en la zona que tiene los sntomas. Esto incluye las alhajas.  Controle todos los das las zonas afectadas para detectar signos de infeccin. Est atento a los siguientes signos: ? Aumento del enrojecimiento, la hinchazn o Chief Technology Officer. ? Ms lquido [place]. ? Calor. ? Pus o mal olor.  Concurra a todas las visitas de [address] se lo haya indicado el mdico. Esto es importante. Comunquese con un mdico si:  No mejora con el tratamiento.  Su afeccin empeora.  Tiene signos de infeccin, como los siguientes: ? Aumento de la hinchazn. ? Dolor a Insurance claims handler. ? Aumento del enrojecimiento. ? Molestias. ? Calor.  Tiene fiebre.  Aparecen nuevos sntomas. Solicite ayuda inmediatamente si:  Tiene un dolor de cabeza muy intenso.  Siente dolor en el cuello.  Tiene el cuello rgido.  Vomita.  Se siente muy somnoliento.  Nota unas lneas rojas en la piel que salen de la zona.  El hueso o la articulacin que se encuentran cerca de la zona le duelen despus de que la piel se [place].  La zona se oscurece.  Tiene dificultad para respirar. Resumen  La dermatitis es el enrojecimiento, el dolor y la hinchazn de la piel.  Los sntomas pueden ocurrir en donde la sustancia irritante lo ha tocado.  El tratamiento puede incluir medicamentos y cuidado de la piel.  Si no conoce la causa de Radiation protection practitioner, lleve un diario.  Comunquese con un mdico si su afeccin empeora o si tiene signos de infeccin. Esta informacin no tiene Theme park manager el consejo del mdico. Asegrese de hacerle al mdico cualquier pregunta que  tenga. Document Revised: 04/07/2018 Document Reviewed: 04/07/2018 Elsevier Patient Education  2020 [employer].

## 2020-05-03 LAB — COMPREHENSIVE METABOLIC PANEL
ALT: 24 IU/L (ref 0–32)
AST: 15 IU/L (ref 0–40)
Albumin/Globulin Ratio: 1.8 (ref 1.2–2.2)
Albumin: 4.2 g/dL (ref 3.8–4.8)
Alkaline Phosphatase: 83 IU/L (ref 44–121)
BUN/Creatinine Ratio: 16 (ref 9–23)
BUN: 11 mg/dL (ref 6–24)
Bilirubin Total: <0.2 mg/dL (ref 0.0–1.2)
CO2: 24 mmol/L (ref 20–29)
Calcium: 9.1 mg/dL (ref 8.7–10.2)
Chloride: 102 mmol/L (ref 96–106)
Creatinine, Ser: 0.68 mg/dL (ref 0.57–1.00)
GFR calc Af Amer: 121 mL/min/{1.73_m2} (ref >59)
GFR calc non Af Amer: 105 mL/min/{1.73_m2} (ref >59)
Globulin, Total: 2.4 g/dL (ref 1.5–4.5)
Glucose: 104 mg/dL — ABNORMAL HIGH (ref 65–99)
Potassium: 3.7 mmol/L (ref 3.5–5.2)
Sodium: 139 mmol/L (ref 134–144)
Total Protein: 6.6 g/dL (ref 6.0–8.5)

## 2020-05-03 LAB — CBC WITH DIFFERENTIAL/PLATELET
Basophils Absolute: 0 10*3/uL (ref 0.0–0.2)
Basos: 0 %
EOS (ABSOLUTE): 0.2 10*3/uL (ref 0.0–0.4)
Eos: 3 %
Hematocrit: 37.5 % (ref 34.0–46.6)
Hemoglobin: 12.9 g/dL (ref 11.1–15.9)
Immature Grans (Abs): 0 10*3/uL (ref 0.0–0.1)
Immature Granulocytes: 0 %
Lymphocytes Absolute: 3.1 10*3/uL (ref 0.7–3.1)
Lymphs: 42 %
MCH: 29.8 pg (ref 26.6–33.0)
MCHC: 34.4 g/dL (ref 31.5–35.7)
MCV: 87 fL (ref 79–97)
Monocytes Absolute: 0.6 10*3/uL (ref 0.1–0.9)
Monocytes: 8 %
Neutrophils Absolute: 3.5 10*3/uL (ref 1.4–7.0)
Neutrophils: 47 %
Platelets: 239 10*3/uL (ref 150–450)
RBC: 4.33 x10E6/uL (ref 3.77–5.28)
RDW: 12.7 % (ref 11.7–15.4)
WBC: 7.3 10*3/uL (ref 3.4–10.8)

## 2020-05-03 LAB — TSH: TSH: 3.25 u[IU]/mL (ref 0.450–4.500)

## 2020-05-03 LAB — HEMOGLOBIN A1C
Est. average glucose Bld gHb Est-mCnc: 114 mg/dL
Hgb A1c MFr Bld: 5.6 % (ref 4.8–5.6)

## 2021-06-25 ENCOUNTER — Inpatient Hospital Stay: Admission: RE | Admit: 2021-06-25 | Payer: Self-pay | Source: Ambulatory Visit

## 2021-06-25 ENCOUNTER — Ambulatory Visit
Admission: EM | Admit: 2021-06-25 | Discharge: 2021-06-25 | Disposition: A | Discharge status: Home / Self Care | Payer: No Typology Code available for payment source | Attending: Emergency Medicine | Admitting: Emergency Medicine

## 2021-06-25 ENCOUNTER — Encounter: Payer: Self-pay | Admitting: Emergency Medicine

## 2021-06-25 DIAGNOSIS — M62838 Other muscle spasm: Secondary | ICD-10-CM

## 2021-06-25 DIAGNOSIS — M542 Cervicalgia: Secondary | ICD-10-CM

## 2021-06-25 MED ORDER — BACLOFEN 10 MG PO TABS: 10.0000 mg | ORAL_TABLET | Freq: Every day | ORAL | 0 refills | Status: AC

## 2021-06-25 MED ORDER — METHYLPREDNISOLONE 4 MG PO TBPK: ORAL_TABLET | ORAL | 0 refills | Status: DC

## 2021-06-25 MED ORDER — KETOROLAC TROMETHAMINE 60 MG/2ML IM SOLN
60.0000 mg | Freq: Once | INTRAMUSCULAR | Status: AC
  Administered 2021-06-25: 60 mg via INTRAMUSCULAR
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
## 2021-09-19 NOTE — Progress Notes (Signed)
New Patient Office Visit  Subjective:  Patient ID: Barbara Coffey, female    DOB: 06-10-1976  Age: 46 y.o. MRN: [id number]  CC:  Chief Complaint  Patient presents with   Establish Care    Np. Est care. Pt c    HPI Barbara Coffey presents for new patient visit to establish care.  Introduced to Designer, jewellery role and practice setting.  All questions answered.  Discussed provider/patient relationship and expectations. She would like her blood work updated today.   She endorses itchy and intermittent pain to both ears. When she was in [place], she was diagnosed with an ear infection, however she has had ongoing symptoms. She denies sore throat, nasal congestion, and fever.   Depression screen done:  Depression screen [facility] 2/9 09/20/2021 09/20/2021 05/02/2020 09/29/2019 04/07/2019  Decreased Interest 0 1 0 0 0  Down, Depressed, Hopeless 1 1 0 0 0  PHQ - 2 Score 1 2 0 0 0  Altered sleeping 0 - - - -  Tired, decreased energy 1 - - - -  Change in appetite 2 - - - -  Feeling bad or failure about yourself  0 - - - -  Trouble concentrating 0 - - - -  Moving slowly or fidgety/restless 1 - - - -  Suicidal thoughts 0 - - - -  PHQ-9 Score 5 - - - -  Difficult doing work/chores Not difficult at all - - - -   GAD 7 : Generalized Anxiety Score 09/20/2021  Nervous, Anxious, on Edge 0  Control/stop worrying 0  Worry too much - different things 0  Trouble relaxing 0  Restless 1  Easily annoyed or irritable 0  Afraid - awful might happen 0  Total GAD 7 Score 1  Anxiety Difficulty Not difficult at all    Past Medical History:  Diagnosis Date   No pertinent past medical history     Past Surgical History:  Procedure Laterality Date   left ovarian cyst     TUBAL LIGATION Bilateral 11/01/2016   Procedure: POST PARTUM TUBAL LIGATION;  Surgeon: Lavonia Drafts, MD;  Location: [facility];  Service: Gynecology;  Laterality: Bilateral;    Family History  Problem Relation Age of  Onset   Hypertension Father    Hypertension Sister     Social History   Socioeconomic History   Marital status: Married    Spouse name: Not on file   Number of children: Not on file   Years of education: Not on file   Highest education level: 9th grade  Occupational History   Not on file  Tobacco Use   Smoking status: Never   Smokeless tobacco: Never  Vaping Use   Vaping Use: Never used  Substance and Sexual Activity   Alcohol use: No   Drug use: No   Sexual activity: Yes    Birth control/protection: Surgical    Comment: BTL  Other Topics Concern   Not on file  Social History Narrative   Not on file   Social Determinants of Health   Financial Resource Strain: Not on file  Food Insecurity: Not on file  Transportation Needs: Not on file  Physical Activity: Not on file  Stress: Not on file  Social Connections: Not on file  Intimate Partner Violence: Not on file    ROS Review of Systems  Constitutional:  Positive for fatigue. Negative for fever.  HENT:  Positive for ear pain (and itching bilaterally). Negative for congestion and sore throat.  Eyes: Negative.   Respiratory: Negative.    Cardiovascular: Negative.   Gastrointestinal: Negative.   Endocrine: Positive for polyuria.  Genitourinary:  Positive for frequency. Negative for dysuria.  Musculoskeletal: Negative.   Skin: Negative.   Neurological: Negative.   Psychiatric/Behavioral: Negative.     Objective:   Today's Vitals: BP 110/70    Pulse 76    Temp (!) 96.9 F (36.1 C) (Temporal)    Ht 5' 0.75" (1.543 m)    Wt 184 lb 6.4 oz (83.6 kg)    LMP 09/12/2021 (Approximate)    SpO2 100%    BMI 35.13 kg/m   Physical Exam Vitals and nursing note reviewed.  Constitutional:      General: She is not in acute distress.    Appearance: Normal appearance.  HENT:     Head: Normocephalic and atraumatic.     Right Ear: External ear normal. Tympanic membrane is erythematous.     Left Ear: Tympanic membrane, ear  canal and external ear normal.     Nose: Nose normal.     Mouth/Throat:     Mouth: Mucous membranes are moist.     Pharynx: Oropharynx is clear.  Eyes:     Conjunctiva/sclera: Conjunctivae normal.  Cardiovascular:     Rate and Rhythm: Normal rate and regular rhythm.     Pulses: Normal pulses.     Heart sounds: Normal heart sounds.  Pulmonary:     Effort: Pulmonary effort is normal.     Breath sounds: Normal breath sounds.  Abdominal:     General: Bowel sounds are normal.     Palpations: Abdomen is soft.     Tenderness: There is no abdominal tenderness.  Musculoskeletal:        General: Normal range of motion.     Cervical back: Normal range of motion.  Skin:    General: Skin is warm and dry.     Comments: Spider veins to lower extremities bilaterally  Neurological:     General: No focal deficit present.     Mental Status: She is alert and oriented to person, place, and time.     Cranial Nerves: No cranial nerve deficit.     Coordination: Coordination normal.     Gait: Gait normal.  Psychiatric:        Mood and Affect: Mood normal.        Behavior: Behavior normal.        Thought Content: Thought content normal.        Judgment: Judgment normal.    Assessment & Plan:   Problem List Items Addressed This Visit       Cardiovascular and Mediastinum   Hemorrhoids    Chronic, stable. Uses hydrocortisone cream prn. Refill sent to pharmacy. Follow up with any concerns.       Spider veins of both lower extremities    Spider veins to bilateral lower extremities, left greater than right. She endorses pain at night. Discussed wearing compression socks during they day. Referral placed to vascular for evaluation.      Relevant Orders   Ambulatory referral to Vascular Surgery     Other   Obesity (BMI 30-39.9)    BMI 35 today. Discussed diet and exercise. Goal is to lose 1-2 pounds per week.       Relevant Orders   Hemoglobin A1c   Other Visit Diagnoses     Routine  general medical examination at a health care facility    -  Primary  Health maintenance reviewed. Flu vaccine today. Has appointment with GYN in March for pap. Referral to GI for colonoscopy. Check CMP, CBC, TSH   Relevant Orders   CBC with Differential/Platelet   Comprehensive metabolic panel   TSH   Urinary frequency       Relevant Orders   Hemoglobin A1c   POCT urinalysis dipstick (Completed)   Encounter for hepatitis C screening test for low risk patient       Screen hepatitis C today   Relevant Orders   Hepatitis C antibody   Screen for colon cancer       Referral placed to GI for colonoscopy   Relevant Orders   Ambulatory referral to Gastroenterology   Screening, lipid       Lipid panel today, treat based on results.   Relevant Orders   Lipid panel   Acute otitis media, unspecified otitis media type       Will treat with augmentin BID x7days. Can use warm/cool compresses to help with itching. Follow up if not improving.    Relevant Medications   amoxicillin-clavulanate (AUGMENTIN) 875-125 MG tablet   Need for influenza vaccination       Relevant Orders   Flu Vaccine QUAD 6+ mos PF IM (Fluarix Quad PF) (Completed)       Outpatient Encounter Medications as of 09/20/2021  Medication Sig   acetaminophen (TYLENOL) 500 MG tablet Take 500 mg by mouth every 6 (six) hours as needed.   amoxicillin-clavulanate (AUGMENTIN) 875-125 MG tablet Take 1 tablet by mouth 2 (two) times daily.   [DISCONTINUED] hydrocortisone (PROCTOSOL HC) 2.5 % rectal cream Place 1 application rectally 2 (two) times daily.   hydrocortisone (PROCTOSOL HC) 2.5 % rectal cream Place 1 application rectally 2 (two) times daily.   [DISCONTINUED] methylPREDNISolone (MEDROL DOSEPAK) 4 MG TBPK tablet Take 24 mg on day 1, 20 mg on day 2, 16 mg on day 3, 12 mg on day 4, 8 mg on day 5, 4 mg on day 6.   No facility-administered encounter medications on file as of 09/20/2021.    Follow-up: Return in about 6 months (around  03/20/2022) for weight, fatigue.   Charyl Dancer, NP

## 2021-09-20 ENCOUNTER — Other Ambulatory Visit: Payer: Self-pay

## 2021-09-20 ENCOUNTER — Encounter: Payer: Self-pay | Admitting: Nurse Practitioner

## 2021-09-20 ENCOUNTER — Ambulatory Visit (INDEPENDENT_AMBULATORY_CARE_PROVIDER_SITE_OTHER): Payer: 59 | Admitting: Nurse Practitioner

## 2021-09-20 VITALS — BP 110/70 | HR 76 | Temp 96.9°F | Ht 60.75 in | Wt 184.4 lb

## 2021-09-20 DIAGNOSIS — R350 Frequency of micturition: Secondary | ICD-10-CM | POA: Diagnosis not present

## 2021-09-20 DIAGNOSIS — H6690 Otitis media, unspecified, unspecified ear: Secondary | ICD-10-CM

## 2021-09-20 DIAGNOSIS — Z13220 Encounter for screening for lipoid disorders: Secondary | ICD-10-CM

## 2021-09-20 DIAGNOSIS — K649 Unspecified hemorrhoids: Secondary | ICD-10-CM

## 2021-09-20 DIAGNOSIS — Z23 Encounter for immunization: Secondary | ICD-10-CM | POA: Diagnosis not present

## 2021-09-20 DIAGNOSIS — I8393 Asymptomatic varicose veins of bilateral lower extremities: Secondary | ICD-10-CM

## 2021-09-20 DIAGNOSIS — Z1211 Encounter for screening for malignant neoplasm of colon: Secondary | ICD-10-CM | POA: Diagnosis not present

## 2021-09-20 DIAGNOSIS — Z0000 Encounter for general adult medical examination without abnormal findings: Secondary | ICD-10-CM | POA: Diagnosis not present

## 2021-09-20 DIAGNOSIS — Z1159 Encounter for screening for other viral diseases: Secondary | ICD-10-CM

## 2021-09-20 DIAGNOSIS — E669 Obesity, unspecified: Secondary | ICD-10-CM | POA: Diagnosis not present

## 2021-09-20 LAB — COMPREHENSIVE METABOLIC PANEL
ALT: 23 U/L (ref 0–35)
AST: 16 U/L (ref 0–37)
Albumin: 4.4 g/dL (ref 3.5–5.2)
Alkaline Phosphatase: 70 U/L (ref 39–117)
BUN: 11 mg/dL (ref 6–23)
CO2: 31 mEq/L (ref 19–32)
Calcium: 8.9 mg/dL (ref 8.4–10.5)
Chloride: 103 mEq/L (ref 96–112)
Creatinine, Ser: 0.53 mg/dL (ref 0.40–1.20)
GFR: 109.95 mL/min (ref >60.00)
Glucose, Bld: 81 mg/dL (ref 70–99)
Potassium: 4.1 mEq/L (ref 3.5–5.1)
Sodium: 139 mEq/L (ref 135–145)
Total Bilirubin: 0.4 mg/dL (ref 0.2–1.2)
Total Protein: 7.1 g/dL (ref 6.0–8.3)

## 2021-09-20 LAB — POCT URINALYSIS DIPSTICK
Bilirubin, UA: NEGATIVE
Blood, UA: NEGATIVE
Glucose, UA: NEGATIVE
Ketones, UA: NEGATIVE
Nitrite, UA: NEGATIVE
Protein, UA: NEGATIVE
Spec Grav, UA: 1.01 (ref 1.010–1.025)
Urobilinogen, UA: 1 E.U./dL
pH, UA: 7 (ref 5.0–8.0)

## 2021-09-20 LAB — CBC WITH DIFFERENTIAL/PLATELET
Basophils Absolute: 0 10*3/uL (ref 0.0–0.1)
Basophils Relative: 0.4 % (ref 0.0–3.0)
Eosinophils Absolute: 0.1 10*3/uL (ref 0.0–0.7)
Eosinophils Relative: 2.5 % (ref 0.0–5.0)
HCT: 40.9 % (ref 36.0–46.0)
Hemoglobin: 13.3 g/dL (ref 12.0–15.0)
Lymphocytes Relative: 39.1 % (ref 12.0–46.0)
Lymphs Abs: 2 10*3/uL (ref 0.7–4.0)
MCHC: 32.4 g/dL (ref 30.0–36.0)
MCV: 88.3 fl (ref 78.0–100.0)
Monocytes Absolute: 0.3 10*3/uL (ref 0.1–1.0)
Monocytes Relative: 6.3 % (ref 3.0–12.0)
Neutro Abs: 2.7 10*3/uL (ref 1.4–7.7)
Neutrophils Relative %: 51.7 % (ref 43.0–77.0)
Platelets: 231 10*3/uL (ref 150.0–400.0)
RBC: 4.64 Mil/uL (ref 3.87–5.11)
RDW: 13.8 % (ref 11.5–15.5)
WBC: 5.2 10*3/uL (ref 4.0–10.5)

## 2021-09-20 LAB — LIPID PANEL
Cholesterol: 186 mg/dL (ref 0–200)
HDL: 46.2 mg/dL (ref >39.00)
LDL Cholesterol: 104 mg/dL — ABNORMAL HIGH (ref 0–99)
NonHDL: 139.9
Total CHOL/HDL Ratio: 4
Triglycerides: 181 mg/dL — ABNORMAL HIGH (ref 0.0–149.0)
VLDL: 36.2 mg/dL (ref 0.0–40.0)

## 2021-09-20 LAB — TSH: TSH: 0.84 u[IU]/mL (ref 0.35–5.50)

## 2021-09-20 LAB — HEMOGLOBIN A1C: Hgb A1c MFr Bld: 5.5 % (ref 4.6–6.5)

## 2021-09-20 MED ORDER — HYDROCORTISONE (PERIANAL) 2.5 % EX CREA: 1.0000 "application " | TOPICAL_CREAM | Freq: Two times a day (BID) | CUTANEOUS | 2 refills | Status: AC

## 2021-09-20 MED ORDER — AMOXICILLIN-POT CLAVULANATE 875-125 MG PO TABS: 1.0000 | ORAL_TABLET | Freq: Two times a day (BID) | ORAL | 0 refills | Status: DC

## 2021-09-20 NOTE — Assessment & Plan Note (Signed)
Chronic, stable. Uses hydrocortisone cream prn. Refill sent to pharmacy. Follow up with any concerns.

## 2021-09-20 NOTE — Assessment & Plan Note (Signed)
Spider veins to bilateral lower extremities, left greater than right. She endorses pain at night. Discussed wearing compression socks during they day. Referral placed to vascular for evaluation.

## 2021-09-20 NOTE — Assessment & Plan Note (Signed)
BMI 35 today. Discussed diet and exercise. Goal is to lose 1-2 pounds per week.  

## 2021-09-20 NOTE — Patient Instructions (Addendum)
It was great to see you!  We are checking your labs and urine and will call you with the results.   I have placed a referral to GI for a colonoscopy and vascular for the spider veins and leg pain.   Start antibiotic, 1 tablet twice a day for 7 days for your ear infection  Let's follow-up in 6 months, sooner if you have concerns.  If a referral was placed today, you will be contacted for an appointment. Please note that routine referrals can sometimes take up to 3-4 weeks to process. Please call our office if you haven't heard anything after this time frame.  Take care,  Rodman Pickle, NP

## 2021-09-23 LAB — HEPATITIS C ANTIBODY
Hepatitis C Ab: NONREACTIVE
SIGNAL TO CUT-OFF: 0.03 (ref <1.00)

## 2021-10-02 ENCOUNTER — Encounter: Payer: Self-pay | Admitting: Internal Medicine

## 2021-10-16 ENCOUNTER — Other Ambulatory Visit: Payer: Self-pay

## 2021-10-16 DIAGNOSIS — I8393 Asymptomatic varicose veins of bilateral lower extremities: Secondary | ICD-10-CM

## 2021-10-18 ENCOUNTER — Ambulatory Visit (INDEPENDENT_AMBULATORY_CARE_PROVIDER_SITE_OTHER): Payer: 59 | Admitting: Family Medicine

## 2021-10-18 ENCOUNTER — Other Ambulatory Visit: Payer: Self-pay

## 2021-10-18 ENCOUNTER — Encounter: Payer: Self-pay | Admitting: Family Medicine

## 2021-10-18 VITALS — BP 118/68 | HR 68 | Temp 98.1°F | Ht 60.0 in | Wt 184.6 lb

## 2021-10-18 DIAGNOSIS — S161XXA Strain of muscle, fascia and tendon at neck level, initial encounter: Secondary | ICD-10-CM

## 2021-10-18 MED ORDER — MELOXICAM 7.5 MG PO TABS: 7.5000 mg | ORAL_TABLET | Freq: Every day | ORAL | 0 refills | Status: DC

## 2021-10-18 MED ORDER — CYCLOBENZAPRINE HCL 10 MG PO TABS: 10.0000 mg | ORAL_TABLET | Freq: Every day | ORAL | 0 refills | Status: AC

## 2021-10-18 NOTE — Progress Notes (Signed)
? ?Established Patient Office Visit ? ?Subjective:  ?Patient ID: Barbara Coffey, female    DOB: [birth year]  Age: 46 y.o. MRN: [id number] ? ?CC:  ?Chief Complaint  ?Patient presents with  ? Edema  ?  Right side of neck seems to be swollen x 2 weeks little pain.   ? ? ?HPI ?Barbara Coffey presents for pain on the right side of her anterior neck.  There is pain in her lateral neck and posterior neck with range of motion through her neck.  There is been no injury or trauma. ? ?Past Medical History:  ?Diagnosis Date  ? No pertinent past medical history   ? ? ?Past Surgical History:  ?Procedure Laterality Date  ? left ovarian cyst    ? TUBAL LIGATION Bilateral 11/01/2016  ? Procedure: POST PARTUM TUBAL LIGATION;  Surgeon: Willodean Rosenthal, MD;  Location: [facility];  Service: Gynecology;  Laterality: Bilateral;  ? ? ?Family History  ?Problem Relation Age of Onset  ? Hypertension Father   ? Hypertension Sister   ? ? ?Social History  ? ?Socioeconomic History  ? Marital status: Married  ?  Spouse name: Not on file  ? Number of children: Not on file  ? Years of education: Not on file  ? Highest education level: 9th grade  ?Occupational History  ? Not on file  ?Tobacco Use  ? Smoking status: Never  ? Smokeless tobacco: Never  ?Vaping Use  ? Vaping Use: Never used  ?Substance and Sexual Activity  ? Alcohol use: No  ? Drug use: No  ? Sexual activity: Yes  ?  Birth control/protection: Surgical  ?  Comment: BTL  ?Other Topics Concern  ? Not on file  ?Social History Narrative  ? Not on file  ? ?Social Determinants of Health  ? ?Financial Resource Strain: Not on file  ?Food Insecurity: Not on file  ?Transportation Needs: Not on file  ?Physical Activity: Not on file  ?Stress: Not on file  ?Social Connections: Not on file  ?Intimate Partner Violence: Not on file  ? ? ?Outpatient Medications Prior to Visit  ?Medication Sig Dispense Refill  ? acetaminophen (TYLENOL) 500 MG tablet Take 500 mg by mouth every 6 (six) hours  as needed.    ? hydrocortisone (PROCTOSOL HC) 2.5 % rectal cream Place 1 application rectally 2 (two) times daily. 30 g 2  ? amoxicillin-clavulanate (AUGMENTIN) 875-125 MG tablet Take 1 tablet by mouth 2 (two) times daily. 14 tablet 0  ? ?No facility-administered medications prior to visit.  ? ? ?No Known Allergies ? ?ROS ?Review of Systems  ?Constitutional:  Negative for diaphoresis, fatigue, fever and unexpected weight change.  ?Respiratory: Negative.    ?Cardiovascular: Negative.  Negative for palpitations.  ?Musculoskeletal:  Positive for myalgias and neck pain. Negative for neck stiffness.  ?Neurological:  Positive for weakness. Negative for numbness.  ? ?  ?Objective:  ?  ?Physical Exam ?Vitals and nursing note reviewed.  ?Constitutional:   ?   General: She is not in acute distress. ?   Appearance: Normal appearance. She is not ill-appearing, toxic-appearing or diaphoretic.  ?HENT:  ?   Head: Normocephalic and atraumatic.  ?   Right Ear: External ear normal.  ?   Left Ear: External ear normal.  ?Eyes:  ?   General: No scleral icterus.    ?   Right eye: No discharge.     ?   Left eye: No discharge.  ?   Extraocular Movements: Extraocular movements intact.  ?  Conjunctiva/sclera: Conjunctivae normal.  ?   Pupils: Pupils are equal, round, and reactive to light.  ?Neck:  ?   Thyroid: No thyromegaly.  ?Musculoskeletal:  ?   Cervical back: Tenderness present. No rigidity, spasms or bony tenderness. Pain with movement present. Normal range of motion.  ?   Comments: There is some discomfort at the insertion of the anterior scl. ?  ?Lymphadenopathy:  ?   Cervical: No cervical adenopathy.  ?Neurological:  ?   Mental Status: She is alert and oriented to person, place, and time.  ?   Motor: No weakness.  ?   Deep Tendon Reflexes:  ?   Reflex Scores: ?     Tricep reflexes are 1+ on the right side and 1+ on the left side. ?     Bicep reflexes are 2+ on the right side and 2+ on the left side. ?     Brachioradialis reflexes  are 2+ on the right side and 2+ on the left side. ?Psychiatric:     ?   Behavior: Behavior normal.  ? ? ?BP 118/68 (BP Location: Right Arm, Patient Position: Sitting, Cuff Size: Normal)   Pulse 68   Temp 98.1 ?F (36.7 ?C) (Temporal)   Ht 5' (1.524 m)   Wt 184 lb 9.6 oz (83.7 kg)   SpO2 98%   BMI 36.05 kg/m?  ?Wt Readings from Last 3 Encounters:  ?10/18/21 184 lb 9.6 oz (83.7 kg)  ?09/20/21 184 lb 6.4 oz (83.6 kg)  ?05/02/20 190 lb (86.2 kg)  ? ? ? ?There are no preventive care reminders to display for this patient. ? ?There are no preventive care reminders to display for this patient. ? ?Lab Results  ?Component Value Date  ? TSH 0.84 09/20/2021  ? ?Lab Results  ?Component Value Date  ? WBC 5.2 09/20/2021  ? HGB 13.3 09/20/2021  ? HCT 40.9 09/20/2021  ? MCV 88.3 09/20/2021  ? PLT 231.0 09/20/2021  ? ?Lab Results  ?Component Value Date  ? NA 139 09/20/2021  ? K 4.1 09/20/2021  ? CO2 31 09/20/2021  ? GLUCOSE 81 09/20/2021  ? BUN 11 09/20/2021  ? CREATININE 0.53 09/20/2021  ? BILITOT 0.4 09/20/2021  ? ALKPHOS 70 09/20/2021  ? AST 16 09/20/2021  ? ALT 23 09/20/2021  ? PROT 7.1 09/20/2021  ? ALBUMIN 4.4 09/20/2021  ? CALCIUM 8.9 09/20/2021  ? ANIONGAP 10 10/31/2016  ? GFR 109.95 09/20/2021  ? ?Lab Results  ?Component Value Date  ? CHOL 186 09/20/2021  ? ?Lab Results  ?Component Value Date  ? HDL 46.20 09/20/2021  ? ?Lab Results  ?Component Value Date  ? LDLCALC 104 (H) 09/20/2021  ? ?Lab Results  ?Component Value Date  ? TRIG 181.0 (H) 09/20/2021  ? ?Lab Results  ?Component Value Date  ? CHOLHDL 4 09/20/2021  ? ?Lab Results  ?Component Value Date  ? HGBA1C 5.5 09/20/2021  ? ? ?  ?Assessment & Plan:  ? ?Problem List Items Addressed This Visit   ?None ?Visit Diagnoses   ? ? Strain of neck muscle, initial encounter    -  Primary  ? Relevant Medications  ? meloxicam (MOBIC) 7.5 MG tablet  ? cyclobenzaprine (FLEXERIL) 10 MG tablet  ? ?  ? ? ?Meds ordered this encounter  ?Medications  ? meloxicam (MOBIC) 7.5 MG tablet  ?   Sig: Take 1 tablet (7.5 mg total) by mouth daily.  ?  Dispense:  30 tablet  ?  Refill:  0  ?  cyclobenzaprine (FLEXERIL) 10 MG tablet  ?  Sig: Take 1 tablet (10 mg total) by mouth at bedtime.  ?  Dispense:  30 tablet  ?  Refill:  0  ? ? ?Follow-up: Return follow up with Rodman Pickle in a few weeks.Marland Kitchen  ?Exercises were given to try. ? ?Mliss Sax, MD ?

## 2021-10-23 ENCOUNTER — Other Ambulatory Visit (HOSPITAL_COMMUNITY)
Admission: RE | Admit: 2021-10-23 | Discharge: 2021-10-23 | Disposition: A | Discharge status: Home / Self Care | Payer: 59 | Source: Ambulatory Visit | Attending: Obstetrics and Gynecology | Admitting: Obstetrics and Gynecology

## 2021-10-23 ENCOUNTER — Ambulatory Visit (INDEPENDENT_AMBULATORY_CARE_PROVIDER_SITE_OTHER): Payer: 59 | Admitting: Obstetrics and Gynecology

## 2021-10-23 ENCOUNTER — Encounter: Payer: Self-pay | Admitting: Obstetrics and Gynecology

## 2021-10-23 ENCOUNTER — Other Ambulatory Visit: Payer: Self-pay

## 2021-10-23 DIAGNOSIS — Z1231 Encounter for screening mammogram for malignant neoplasm of breast: Secondary | ICD-10-CM | POA: Diagnosis not present

## 2021-10-23 DIAGNOSIS — Z01419 Encounter for gynecological examination (general) (routine) without abnormal findings: Secondary | ICD-10-CM

## 2021-10-23 NOTE — Progress Notes (Signed)
46 y.o GYN presents for AEX/PAP/STD screening.  She is starting to get hot flashes. ?

## 2021-10-23 NOTE — Patient Instructions (Signed)

## 2021-10-23 NOTE — Progress Notes (Signed)
Patient ID: Barbara Coffey, female   DOB: Jan 05, 1976, 46 y.o.   MRN: [id number] ? Barbara Coffey is a 46 y.o. [phone] female here for a routine annual gynecologic exam.  Current complaints: Denies any GYN problems.   Denies abnormal vaginal bleeding, discharge, pelvic pain, problems with intercourse or other gynecologic concerns.  ?  ?Gynecologic History ?Patient's last menstrual period was 10/13/2021. ?Contraception: tubal ligation ?Last Pap: 2017. Results were: normal ?Last mammogram: NA.  ? ?Obstetric History ?OB History  ?Gravida Para Term Preterm AB Living  ?7 5 5  0 2 5  ?SAB IAB Ectopic Multiple Live Births  ?2 0 0 0 5  ?  ?# Outcome Date GA Lbr Len/2nd Weight Sex Delivery Anes PTL Lv  ?7 Term 11/01/16 [redacted]w[redacted]d 10:25 / 00:18 8 lb 8.7 oz (3.875 kg) M Vag-Spont EPI  LIV  ?6 Term 02/13/13 [redacted]w[redacted]d 12:00 / 00:16 8 lb 2.9 oz (3.71 kg) F Vag-Spont EPI  LIV  ?5 SAB  [redacted]w[redacted]d         ?   Birth Comments: System Generated. Please review and update pregnancy details.  ?4 Term  [redacted]w[redacted]d  8 lb 10 oz (3.912 kg) M Vag-Spont   LIV  ?3 Term  [redacted]w[redacted]d  7 lb 15 oz (3.6 kg) F Vag-Spont  Y LIV  ?2 Term  [redacted]w[redacted]d  7 lb 8 oz (3.402 kg) F Vag-Spont   LIV  ?1 SAB  [redacted]w[redacted]d         ? ? ?Past Medical History:  ?Diagnosis Date  ? No pertinent past medical history   ? ? ?Past Surgical History:  ?Procedure Laterality Date  ? left ovarian cyst    ? TUBAL LIGATION Bilateral 11/01/2016  ? Procedure: POST PARTUM TUBAL LIGATION;  Surgeon: 11/03/2016, MD;  Location: [facility];  Service: Gynecology;  Laterality: Bilateral;  ? ? ?Current Outpatient Medications on File Prior to Visit  ?Medication Sig Dispense Refill  ? acetaminophen (TYLENOL) 500 MG tablet Take 500 mg by mouth every 6 (six) hours as needed.    ? cyclobenzaprine (FLEXERIL) 10 MG tablet Take 1 tablet (10 mg total) by mouth at bedtime. 30 tablet 0  ? hydrocortisone (PROCTOSOL HC) 2.5 % rectal cream Place 1 application rectally 2 (two) times daily. 30 g 2  ? meloxicam (MOBIC) 7.5 MG  tablet Take 1 tablet (7.5 mg total) by mouth daily. 30 tablet 0  ? ?No current facility-administered medications on file prior to visit.  ? ? ?No Known Allergies ? ?Social History  ? ?Socioeconomic History  ? Marital status: Married  ?  Spouse name: Not on file  ? Number of children: Not on file  ? Years of education: Not on file  ? Highest education level: 9th grade  ?Occupational History  ? Not on file  ?Tobacco Use  ? Smoking status: Never  ? Smokeless tobacco: Never  ?Vaping Use  ? Vaping Use: Never used  ?Substance and Sexual Activity  ? Alcohol use: No  ? Drug use: No  ? Sexual activity: Yes  ?  Birth control/protection: Surgical  ?  Comment: BTL  ?Other Topics Concern  ? Not on file  ?Social History Narrative  ? Not on file  ? ?Social Determinants of Health  ? ?Financial Resource Strain: Not on file  ?Food Insecurity: Not on file  ?Transportation Needs: Not on file  ?Physical Activity: Not on file  ?Stress: Not on file  ?Social Connections: Not on file  ?Intimate Partner Violence: Not on file  ? ? ?  Family History  ?Problem Relation Age of Onset  ? Hypertension Father   ? Hypertension Sister   ? ? ?The following portions of the patient's history were reviewed and updated as appropriate: allergies, current medications, past family history, past medical history, past social history, past surgical history and problem list. ? ?Review of Systems ?Pertinent items noted in HPI and remainder of comprehensive ROS otherwise negative. ?  ?Objective:  ?BP 116/77   Pulse 73   Ht 5\' 1"  (1.549 m)   Wt 188 lb (85.3 kg)   LMP 10/13/2021   BMI 35.52 kg/m?  ?CONSTITUTIONAL: Well-developed, well-nourished female in no acute distress.  ?HENT:  Normocephalic, atraumatic, External right and left ear normal. Oropharynx is clear and moist ?EYES: Conjunctivae and EOM are normal. Pupils are equal, round, and reactive to light. No scleral icterus.  ?NECK: Normal range of motion, supple, no masses.  Normal thyroid.  ?SKIN: Skin is  warm and dry. No rash noted. Not diaphoretic. No erythema. No pallor. ?NEUROLGIC: Alert and oriented to person, place, and time. Normal reflexes, muscle tone coordination. No cranial nerve deficit noted. ?PSYCHIATRIC: Normal mood and affect. Normal behavior. Normal judgment and thought content. ?CARDIOVASCULAR: Normal heart rate noted, regular rhythm ?RESPIRATORY: Clear to auscultation bilaterally. Effort and breath sounds normal, no problems with respiration noted. ?BREASTS: Symmetric in size. No masses, skin changes, nipple drainage, or lymphadenopathy. ?ABDOMEN: Soft, normal bowel sounds, no distention noted.  No tenderness, rebound or guarding.  ?PELVIC: Normal appearing external genitalia; normal appearing vaginal mucosa and cervix.  No abnormal discharge noted.  Pap smear obtained.  Normal uterine size, no other palpable masses, no uterine or adnexal tenderness. ?MUSCULOSKELETAL: Normal range of motion. No tenderness.  No cyanosis, clubbing, or edema.  2+ distal pulses. ? ? ?Assessment:  ?Annual gynecologic examination with pap smear ? ?Plan:  ?Will follow up results of pap smear and manage accordingly. ?Mammogram scheduled ?Routine preventative health maintenance measures emphasized. ?Please refer to After Visit Summary for other counseling recommendations.  ? ? ?10/15/2021, MD, FACOG ?Attending Obstetrician & Gynecologist ?[facility]  ?

## 2021-10-25 ENCOUNTER — Encounter: Payer: Self-pay | Admitting: Nurse Practitioner

## 2021-10-25 ENCOUNTER — Ambulatory Visit: Payer: 59 | Admitting: Nurse Practitioner

## 2021-10-25 ENCOUNTER — Other Ambulatory Visit: Payer: Self-pay

## 2021-10-25 VITALS — BP 121/77 | HR 70 | Temp 97.3°F | Ht 61.0 in | Wt 188.0 lb

## 2021-10-25 DIAGNOSIS — J029 Acute pharyngitis, unspecified: Secondary | ICD-10-CM

## 2021-10-25 DIAGNOSIS — B079 Viral wart, unspecified: Secondary | ICD-10-CM | POA: Insufficient documentation

## 2021-10-25 LAB — POCT RAPID STREP A (OFFICE): Rapid Strep A Screen: NEGATIVE

## 2021-10-25 MED ORDER — NYSTATIN 100000 UNIT/ML MT SUSP: 5.0000 mL | Freq: Four times a day (QID) | OROMUCOSAL | 0 refills | Status: DC | PRN

## 2021-10-25 NOTE — Assessment & Plan Note (Signed)
2 warts noted to her left index finger. She has a history of having them "burned off" in the past. She states that they tend to come and go. She has tried over the counter medications without success. She would like them taken care of again. Discussed the possible complications and what to watch for. She gave verbal consent. Histofreezer cryotherapy applied to each lesion for 40 seconds. She tolerated the procedure well and no complications noted. Follow up if symptoms ongoing or with any concerns.  ?

## 2021-10-25 NOTE — Progress Notes (Signed)
? ?Acute Office Visit ? ?Subjective:  ? ? Patient ID: Barbara Coffey, female    DOB: 12/22/1975, 46 y.o.   MRN: [id number] ? ?Chief Complaint  ?Patient presents with  ? Sore Throat  ?  Pt c/o sore throat, headache, and neck pain x3 days.   ? ? ?HPI ?Patient is in today for sore throat since Tuesday. She also endorses 2 warts on her left index finger. She states that they come and go. She had them burned off in the past. She has tried over the counter medicine, however they have not gone away. They do not itch or hurt.  ? ?UPPER RESPIRATORY TRACT INFECTION ? ?Fever: no ?Cough: no ?Shortness of breath: no ?Wheezing: no ?Chest pain: no ?Chest tightness: no ?Chest congestion: no ?Nasal congestion: no ?Runny nose: no ?Post nasal drip: no ?Sneezing: no ?Sore throat: yes ?Swollen glands: yes ?Sinus pressure: yes ?Headache: yes ?Face pain: no ?Toothache: no ?Ear pain: no bilateral ?Ear pressure: yes bilateral ?Eyes red/itching:yes ?Eye drainage/crusting: no  ?Vomiting: no ?Rash: no ?Fatigue: yes ?Sick contacts: yes ?Strep contacts: yes kids ?Context: worse ?Recurrent sinusitis: no ?Relief with OTC cold/cough medications: yes  ?Treatments attempted: anti-histamine, naproxen, vicks lozenge, zyrtec  ? ? ?Past Medical History:  ?Diagnosis Date  ? No pertinent past medical history   ? ? ?Past Surgical History:  ?Procedure Laterality Date  ? left ovarian cyst    ? TUBAL LIGATION Bilateral 11/01/2016  ? Procedure: POST PARTUM TUBAL LIGATION;  Surgeon: Willodean Rosenthalarolyn Harraway-Smith, MD;  Location: [facility];  Service: Gynecology;  Laterality: Bilateral;  ? ? ?Family History  ?Problem Relation Age of Onset  ? Hypertension Father   ? Hypertension Sister   ? ? ?Social History  ? ?Socioeconomic History  ? Marital status: Married  ?  Spouse name: Not on file  ? Number of children: Not on file  ? Years of education: Not on file  ? Highest education level: 9th grade  ?Occupational History  ? Not on file  ?Tobacco Use  ? Smoking status: Never   ? Smokeless tobacco: Never  ?Vaping Use  ? Vaping Use: Never used  ?Substance and Sexual Activity  ? Alcohol use: No  ? Drug use: No  ? Sexual activity: Yes  ?  Birth control/protection: Surgical  ?  Comment: BTL  ?Other Topics Concern  ? Not on file  ?Social History Narrative  ? Not on file  ? ?Social Determinants of Health  ? ?Financial Resource Strain: Not on file  ?Food Insecurity: Not on file  ?Transportation Needs: Not on file  ?Physical Activity: Not on file  ?Stress: Not on file  ?Social Connections: Not on file  ?Intimate Partner Violence: Not on file  ? ? ?Outpatient Medications Prior to Visit  ?Medication Sig Dispense Refill  ? acetaminophen (TYLENOL) 500 MG tablet Take 500 mg by mouth every 6 (six) hours as needed.    ? cyclobenzaprine (FLEXERIL) 10 MG tablet Take 1 tablet (10 mg total) by mouth at bedtime. 30 tablet 0  ? hydrocortisone (PROCTOSOL HC) 2.5 % rectal cream Place 1 application rectally 2 (two) times daily. 30 g 2  ? meloxicam (MOBIC) 7.5 MG tablet Take 1 tablet (7.5 mg total) by mouth daily. 30 tablet 0  ? ?No facility-administered medications prior to visit.  ? ? ?No Known Allergies ? ?Review of Systems ?See pertinent positives and negatives per HPI. ? ?   ?Objective:  ?  ?Physical Exam ?Vitals and nursing note reviewed.  ?Constitutional:   ?  General: She is not in acute distress. ?   Appearance: Normal appearance.  ?HENT:  ?   Head: Normocephalic.  ?Eyes:  ?   Conjunctiva/sclera: Conjunctivae normal.  ?Cardiovascular:  ?   Rate and Rhythm: Normal rate and regular rhythm.  ?   Pulses: Normal pulses.  ?   Heart sounds: Normal heart sounds.  ?Pulmonary:  ?   Effort: Pulmonary effort is normal.  ?   Breath sounds: Normal breath sounds.  ?Musculoskeletal:  ?   Cervical back: Normal range of motion and neck supple. Tenderness present.  ?Lymphadenopathy:  ?   Cervical: No cervical adenopathy.  ?Skin: ?   General: Skin is warm.  ?   Comments: White raised lesion to left index finger x2   ?Neurological:  ?   General: No focal deficit present.  ?   Mental Status: She is alert and oriented to person, place, and time.  ?Psychiatric:     ?   Mood and Affect: Mood normal.     ?   Behavior: Behavior normal.     ?   Thought Content: Thought content normal.     ?   Judgment: Judgment normal.  ? ? ?BP 121/77 (BP Location: Left Arm, Patient Position: Sitting, Cuff Size: Normal)   Pulse 70   Temp (!) 97.3 ?F (36.3 ?C) (Temporal)   Ht 5\' 1"  (1.549 m)   Wt 188 lb (85.3 kg)   LMP 10/13/2021   SpO2 100%   BMI 35.52 kg/m?  ?Wt Readings from Last 3 Encounters:  ?10/25/21 188 lb (85.3 kg)  ?10/23/21 188 lb (85.3 kg)  ?10/18/21 184 lb 9.6 oz (83.7 kg)  ? ? ?Health Maintenance Due  ?Topic Date Due  ? PAP SMEAR-Modifier  04/18/2019  ? MAMMOGRAM  08/29/2020  ? ? ?There are no preventive care reminders to display for this patient. ? ? ?Lab Results  ?Component Value Date  ? TSH 0.84 09/20/2021  ? ?Lab Results  ?Component Value Date  ? WBC 5.2 09/20/2021  ? HGB 13.3 09/20/2021  ? HCT 40.9 09/20/2021  ? MCV 88.3 09/20/2021  ? PLT 231.0 09/20/2021  ? ?Lab Results  ?Component Value Date  ? NA 139 09/20/2021  ? K 4.1 09/20/2021  ? CO2 31 09/20/2021  ? GLUCOSE 81 09/20/2021  ? BUN 11 09/20/2021  ? CREATININE 0.53 09/20/2021  ? BILITOT 0.4 09/20/2021  ? ALKPHOS 70 09/20/2021  ? AST 16 09/20/2021  ? ALT 23 09/20/2021  ? PROT 7.1 09/20/2021  ? ALBUMIN 4.4 09/20/2021  ? CALCIUM 8.9 09/20/2021  ? ANIONGAP 10 10/31/2016  ? GFR 109.95 09/20/2021  ? ?Lab Results  ?Component Value Date  ? CHOL 186 09/20/2021  ? ?Lab Results  ?Component Value Date  ? HDL 46.20 09/20/2021  ? ?Lab Results  ?Component Value Date  ? LDLCALC 104 (H) 09/20/2021  ? ?Lab Results  ?Component Value Date  ? TRIG 181.0 (H) 09/20/2021  ? ?Lab Results  ?Component Value Date  ? CHOLHDL 4 09/20/2021  ? ?Lab Results  ?Component Value Date  ? HGBA1C 5.5 09/20/2021  ? ? ?   ?Assessment & Plan:  ? ?Problem List Items Addressed This Visit   ? ?  ? Other  ? [place]  ?   2 warts noted to her left index finger. She has a history of having them "burned off" in the past. She states that they tend to come and go. She has tried over the counter medications without success. She would  like them taken care of again. Discussed the possible complications and what to watch for. She gave verbal consent. Histofreezer cryotherapy applied to each lesion for 40 seconds. She tolerated the procedure well and no complications noted. Follow up if symptoms ongoing or with any concerns.  ?  ?  ? Relevant Medications  ? magic mouthwash (nystatin, lidocaine, diphenhydrAMINE, alum & mag hydroxide) suspension  ? ?Other Visit Diagnoses   ? ? Sore throat    -  Primary  ? Strep negative.Most likely viral.Continue naproxen, zyrtec, and benadryl prn.Magic mouth wash sent to pharmacy.Encourage fluids.Can gargle with warm salt water  ? Relevant Orders  ? POCT rapid strep A  ? ?  ? ? ?Meds ordered this encounter  ?Medications  ? magic mouthwash (nystatin, lidocaine, diphenhydrAMINE, alum & mag hydroxide) suspension  ?  Sig: Swish and swallow 5 mLs 4 (four) times daily as needed for mouth pain.  ?  Dispense:  180 mL  ?  Refill:  0  ? ? ? ?Gerre Scull, NP ? ?

## 2021-10-25 NOTE — Patient Instructions (Signed)
It was great to see you! ? ?I sent in some mouthwash to gargle and swallow for your throat pain. Keep taking zyrtec daily. You can take naproxen as needed. ? ?Let's follow-up if your symptoms worsen or don't improve ? ?Take care, ? ?Rodman Pickle, NP ? ? ?

## 2021-10-28 LAB — CYTOLOGY - PAP
Comment: NEGATIVE
Diagnosis: NEGATIVE
High risk HPV: NEGATIVE

## 2021-11-01 ENCOUNTER — Encounter (HOSPITAL_COMMUNITY): Payer: 59

## 2021-11-01 ENCOUNTER — Encounter: Payer: 59 | Admitting: Vascular Surgery

## 2021-11-08 ENCOUNTER — Other Ambulatory Visit: Payer: Self-pay

## 2021-11-08 ENCOUNTER — Encounter (HOSPITAL_BASED_OUTPATIENT_CLINIC_OR_DEPARTMENT_OTHER): Payer: Self-pay | Admitting: Radiology

## 2021-11-08 ENCOUNTER — Ambulatory Visit (HOSPITAL_BASED_OUTPATIENT_CLINIC_OR_DEPARTMENT_OTHER)
Admission: RE | Admit: 2021-11-08 | Discharge: 2021-11-08 | Disposition: A | Discharge status: Home / Self Care | Payer: 59 | Source: Ambulatory Visit | Attending: Obstetrics and Gynecology | Admitting: Obstetrics and Gynecology

## 2021-11-08 DIAGNOSIS — Z1231 Encounter for screening mammogram for malignant neoplasm of breast: Secondary | ICD-10-CM | POA: Diagnosis present

## 2021-11-21 ENCOUNTER — Ambulatory Visit (AMBULATORY_SURGERY_CENTER): Payer: 59 | Admitting: *Deleted

## 2021-11-21 VITALS — Ht 61.0 in | Wt 184.0 lb

## 2021-11-21 DIAGNOSIS — Z1211 Encounter for screening for malignant neoplasm of colon: Secondary | ICD-10-CM

## 2021-11-21 DIAGNOSIS — Z789 Other specified health status: Secondary | ICD-10-CM | POA: Insufficient documentation

## 2021-11-21 MED ORDER — PEG 3350-KCL-NA BICARB-NACL 420 G PO SOLR: 4000.0000 mL | Freq: Once | ORAL | 0 refills | Status: AC

## 2021-11-21 NOTE — Progress Notes (Signed)
No egg or soy allergy known to patient  ?No issues known to pt with past sedation with any surgeries or procedures ?Patient denies ever being told they had issues or difficulty with intubation  ?No FH of Malignant Hyperthermia ?Pt is not on diet pills ?Pt is not on  home 02  ?Pt is not on blood thinners  ?Pt denies issues with constipation currently- has a past hx of issues  ?No A fib or A flutter ? ?  NO PA's for preps discussed with pt In PV today  ?Discussed with pt there will be an out-of-pocket cost for prep and that varies from $0 to 70 +  dollars - pt verbalized understanding  ? ?Due to the COVID-19 pandemic we are asking patients to follow certain guidelines in PV and the [facility]   ?Pt aware of COVID protocols and [facility] guidelines  ? ?PV completed over the phone. Pt verified name, DOB, address and insurance during PV today.  ?Pt mailed instruction packet with copy of consent form to read and not return, and instructions.  ?Pt encouraged to call with questions or issues.  ?  ? ?

## 2021-11-22 ENCOUNTER — Encounter (HOSPITAL_COMMUNITY): Payer: 59

## 2021-11-22 ENCOUNTER — Encounter: Payer: 59 | Admitting: Vascular Surgery

## 2021-11-28 ENCOUNTER — Encounter: Payer: Self-pay | Admitting: Internal Medicine

## 2021-12-01 ENCOUNTER — Encounter: Payer: Self-pay | Admitting: Certified Registered Nurse Anesthetist

## 2021-12-06 ENCOUNTER — Encounter: Payer: Self-pay | Admitting: Internal Medicine

## 2021-12-06 ENCOUNTER — Ambulatory Visit (AMBULATORY_SURGERY_CENTER): Payer: 59 | Admitting: Internal Medicine

## 2021-12-06 VITALS — BP 113/71 | HR 65 | Temp 98.3°F | Resp 13 | Ht 61.0 in | Wt 184.0 lb

## 2021-12-06 DIAGNOSIS — Z1211 Encounter for screening for malignant neoplasm of colon: Secondary | ICD-10-CM | POA: Diagnosis present

## 2021-12-06 MED ORDER — SODIUM CHLORIDE 0.9 % IV SOLN: 500.0000 mL | Freq: Once | INTRAVENOUS | Status: DC

## 2021-12-06 NOTE — Op Note (Signed)
[facility] ?Patient Name: Barbara Coffey ?Procedure Date: 12/06/2021 9:29 AM ?MRN: [id number] ?Endoscopist: Nicole Kindred "Eulah Pont ,  ?Age: 46 ?Referring MD:  ?Date of Birth: September 04, 1975 ?Gender: Female ?Account #: [id number] ?Procedure:                Colonoscopy ?Indications:              Screening for colorectal malignant neoplasm ?Medicines:                Monitored Anesthesia Care ?Procedure:                Pre-Anesthesia Assessment: ?                          - Prior to the procedure, a History and Physical  ?                          was performed, and patient medications and  ?                          allergies were reviewed. The patient's tolerance of  ?                          previous anesthesia was also reviewed. The risks  ?                          and benefits of the procedure and the sedation  ?                          options and risks were discussed with the patient.  ?                          All questions were answered, and informed consent  ?                          was obtained. Prior Anticoagulants: The patient has  ?                          taken no previous anticoagulant or antiplatelet  ?                          agents. ASA Grade Assessment: II - A patient with  ?                          mild systemic disease. After reviewing the risks  ?                          and benefits, the patient was deemed in  ?                          satisfactory condition to undergo the procedure. ?                          After obtaining informed consent, the colonoscope  ?  was passed under direct vision. Throughout the  ?                          procedure, the patient's blood pressure, pulse, and  ?                          oxygen saturations were monitored continuously. The  ?                          Olympus CF-[id number] ([id number]) Colonoscope was  ?                          introduced through the anus and advanced to the the  ?                           terminal ileum. The colonoscopy was performed  ?                          without difficulty. The patient tolerated the  ?                          procedure well. The quality of the bowel  ?                          preparation was good. The terminal ileum, ileocecal  ?                          valve, appendiceal orifice, and rectum were  ?                          photographed. ?Scope In: 9:32:50 AM ?Scope Out: 9:47:53 AM ?Scope Withdrawal Time: 0 hours 11 minutes 37 seconds  ?Total Procedure Duration: 0 hours 15 minutes 3 seconds  ?Findings:                 The terminal ileum appeared normal. ?                          Non-bleeding internal hemorrhoids were found during  ?                          retroflexion. ?Complications:            No immediate complications. ?Estimated Blood Loss:     Estimated blood loss was minimal. ?Impression:               - The examined portion of the ileum was normal. ?                          - Non-bleeding internal hemorrhoids. ?                          - No specimens collected. ?Recommendation:           - Discharge patient to home (with escort). ?                          -  Repeat colonoscopy in 10 years for screening  ?                          purposes. ?                          - The findings and recommendations were discussed  ?                          with the patient. ?Particia Lather,  ?12/06/2021 9:56:39 AM ?

## 2021-12-06 NOTE — Patient Instructions (Addendum)
YOU HAD AN ENDOSCOPIC PROCEDURE TODAY AT THE [facility]:   Refer to the procedure report that was given to you for any specific questions about what was found during the examination.  If the procedure report does not answer your questions, please call your gastroenterologist to clarify.  If you requested that your care partner not be given the details of your procedure findings, then the procedure report has been included in a sealed envelope for you to review at your convenience later.  YOU SHOULD EXPECT: Some feelings of bloating in the abdomen. Passage of more gas than usual.  Walking can help get rid of the air that was put into your GI tract during the procedure and reduce the bloating. If you had a lower endoscopy (such as a colonoscopy or flexible sigmoidoscopy) you may notice spotting of blood in your stool or on the toilet paper. If you underwent a bowel prep for your procedure, you may not have a normal bowel movement for a few days.  Please Note:  You might notice some irritation and congestion in your nose or some drainage.  This is from the oxygen used during your procedure.  There is no need for concern and it should clear up in a day or so.  SYMPTOMS TO REPORT IMMEDIATELY:   Following lower endoscopy (colonoscopy or flexible sigmoidoscopy):  Excessive amounts of blood in the stool  Significant tenderness or worsening of abdominal pains  Swelling of the abdomen that is new, acute  Fever of 100F or higher  For urgent or emergent issues, a gastroenterologist can be reached at any hour by calling [phone]. Do not use MyChart messaging for urgent concerns.    DIET:  We do recommend a small meal at first, but then you may proceed to your regular diet.  Drink plenty of fluids but you should avoid alcoholic beverages for 24 hours.  ACTIVITY:  You should plan to take it easy for the rest of today and you should NOT DRIVE or use heavy machinery until tomorrow (because  of the sedation medicines used during the test).    FOLLOW UP: Our staff will call the number listed on your records 48-72 hours following your procedure to check on you and address any questions or concerns that you may have regarding the information given to you following your procedure. If we do not reach you, we will leave a message.  We will attempt to reach you two times.  During this call, we will ask if you have developed any symptoms of COVID 19. If you develop any symptoms (ie: fever, flu-like symptoms, shortness of breath, cough etc.) before then, please call [phone].  If you test positive for Covid 19 in the 2 weeks post procedure, please call and report this information to us.    If any biopsies were taken you will be contacted by phone or by letter within the next 1-3 weeks.  Please call us at [phone] if you have not heard about the biopsies in 3 weeks.    SIGNATURES/CONFIDENTIALITY: You and/or your care partner have signed paperwork which will be entered into your electronic medical record.  These signatures attest to the fact that that the information above on your After Visit Summary has been reviewed and is understood.  Full responsibility of the confidentiality of this discharge information lies with you and/or your care-partner. 

## 2021-12-06 NOTE — Progress Notes (Signed)
Report given to PACU, vss 

## 2021-12-06 NOTE — Progress Notes (Signed)
Vitals-CW  Pt's states no medical or surgical changes since previsit or office visit. 

## 2021-12-06 NOTE — Progress Notes (Signed)
? ?GASTROENTEROLOGY PROCEDURE H&P NOTE  ? ?Primary Care Physician: ?McElwee, Lauren A, NP ? ? ? ?Reason for Procedure:   Colon cancer screening ? ?Plan:    Colonoscopy ? ?Patient is appropriate for endoscopic procedure(s) in the ambulatory ([facility]) setting. ? ?The nature of the procedure, as well as the risks, benefits, and alternatives were carefully and thoroughly reviewed with the patient. Ample time for discussion and questions allowed. The patient understood, was satisfied, and agreed to proceed.  ? ? ? ?HPI: ?Barbara Coffey is a 46 y.o. female who presents for colonoscopy for colon cancer screening. Denies blood in stools, changes in bowel habits, weight loss. Denies fam hx of colon cancer. ? ?Past Medical History:  ?Diagnosis Date  ? Allergy   ? Depression   ? 12-13 years ago  ? Hemorrhoids   ? ? ?Past Surgical History:  ?Procedure Laterality Date  ? left ovarian cyst    ? TUBAL LIGATION Bilateral 11/01/2016  ? Procedure: POST PARTUM TUBAL LIGATION;  Surgeon: Willodean Rosenthal, MD;  Location: [facility];  Service: Gynecology;  Laterality: Bilateral;  ? ? ?Prior to Admission medications   ?Medication Sig Start Date End Date Taking? Authorizing Provider  ?acetaminophen (TYLENOL) 500 MG tablet Take 500 mg by mouth every 6 (six) hours as needed.    [provider]  ?Calcium Carbonate (CALCIUM 600 PO) Take by mouth.    [provider]  ?Cyanocobalamin (VITAMIN B 12 PO) Take by mouth.    [provider]  ?cyclobenzaprine (FLEXERIL) 10 MG tablet Take 1 tablet (10 mg total) by mouth at bedtime. 10/18/21   Mliss Sax, MD  ?hydrocortisone (PROCTOSOL HC) 2.5 % rectal cream Place 1 application rectally 2 (two) times daily. 09/20/21   McElwee, Jake Church, NP  ?magic mouthwash (nystatin, lidocaine, diphenhydrAMINE, alum & mag hydroxide) suspension Swish and swallow 5 mLs 4 (four) times daily as needed for mouth pain. 10/25/21   McElwee, Lauren A, NP  ?MAGNESIUM PO Take by mouth.     [provider]  ?meloxicam (MOBIC) 7.5 MG tablet Take 1 tablet (7.5 mg total) by mouth daily. 10/18/21   Mliss Sax, MD  ?VITAMIN D PO Take by mouth.    [provider]  ? ? ?Current Outpatient Medications  ?Medication Sig Dispense Refill  ? acetaminophen (TYLENOL) 500 MG tablet Take 500 mg by mouth every 6 (six) hours as needed.    ? Calcium Carbonate (CALCIUM 600 PO) Take by mouth.    ? Cyanocobalamin (VITAMIN B 12 PO) Take by mouth.    ? cyclobenzaprine (FLEXERIL) 10 MG tablet Take 1 tablet (10 mg total) by mouth at bedtime. 30 tablet 0  ? hydrocortisone (PROCTOSOL HC) 2.5 % rectal cream Place 1 application rectally 2 (two) times daily. 30 g 2  ? magic mouthwash (nystatin, lidocaine, diphenhydrAMINE, alum & mag hydroxide) suspension Swish and swallow 5 mLs 4 (four) times daily as needed for mouth pain. 180 mL 0  ? MAGNESIUM PO Take by mouth.    ? meloxicam (MOBIC) 7.5 MG tablet Take 1 tablet (7.5 mg total) by mouth daily. 30 tablet 0  ? VITAMIN D PO Take by mouth.    ? ?No current facility-administered medications for this visit.  ? ? ?Allergies as of 12/06/2021  ? (No Known Allergies)  ? ? ?Family History  ?Problem Relation Age of Onset  ? Hypertension Father   ? Hypertension Sister   ? Colon cancer Neg Hx   ? Colon polyps Neg Hx   ?  Esophageal cancer Neg Hx   ? Stomach cancer Neg Hx   ? Rectal cancer Neg Hx   ? ? ?Social History  ? ?Socioeconomic History  ? Marital status: Married  ?  Spouse name: Not on file  ? Number of children: Not on file  ? Years of education: Not on file  ? Highest education level: 9th grade  ?Occupational History  ? Not on file  ?Tobacco Use  ? Smoking status: Never  ? Smokeless tobacco: Never  ?Vaping Use  ? Vaping Use: Never used  ?Substance and Sexual Activity  ? Alcohol use: No  ? Drug use: No  ? Sexual activity: Yes  ?  Birth control/protection: Surgical  ?  Comment: BTL  ?Other Topics Concern  ? Not on file  ?Social History Narrative  ? Not on file   ? ?Social Determinants of Health  ? ?Financial Resource Strain: Not on file  ?Food Insecurity: Not on file  ?Transportation Needs: Not on file  ?Physical Activity: Not on file  ?Stress: Not on file  ?Social Connections: Not on file  ?Intimate Partner Violence: Not on file  ? ? ?Physical Exam: ?Vital signs in last 24 hours: ?Temp 98.3 ?F (36.8 ?C)   LMP 11/10/2021  ?GEN: NAD ?EYE: Sclerae anicteric ?ENT: MMM ?CV: Non-tachycardic ?Pulm: No increased work of breathing ?GI: Soft, NT/ND ?NEURO:  Alert & Oriented ? ? ?Eulah Pont, MD ?[facility] ? ?12/06/2021 8:59 AM ? ?

## 2021-12-10 ENCOUNTER — Telehealth: Payer: Self-pay

## 2021-12-10 NOTE — Telephone Encounter (Signed)
?  Follow up Call- ? ? ?  12/06/2021  ?  8:49 AM  ?Call back number  ?Post procedure Call Back phone  # [phone]  ?Permission to leave phone message Yes  ?  ? ?Patient questions: ? ?Do you have a fever, pain , or abdominal swelling? No. ?Pain Score  0 * ? ?Have you tolerated food without any problems? Yes.   ? ?Have you been able to return to your normal activities? Yes.   ? ?Do you have any questions about your discharge instructions: ?Diet   No. ?Medications  No. ?Follow up visit  No. ? ?Do you have questions or concerns about your Care? No. ? ?Actions: ?* If pain score is 4 or above: ?No action needed, pain <4. ? ? ?

## 2021-12-12 ENCOUNTER — Other Ambulatory Visit: Payer: Self-pay | Admitting: *Deleted

## 2021-12-12 DIAGNOSIS — I8393 Asymptomatic varicose veins of bilateral lower extremities: Secondary | ICD-10-CM

## 2021-12-20 ENCOUNTER — Encounter (HOSPITAL_COMMUNITY): Payer: 59

## 2021-12-20 ENCOUNTER — Encounter: Payer: 59 | Admitting: Vascular Surgery

## 2021-12-26 NOTE — Progress Notes (Signed)
?Office Note  ? ? ? ?CC: Bilateral lower extremity telangiectasias ?Requesting Provider:  Gerre Scull, NP ? ?HPI: Barbara Coffey is a 46 y.o. (1976/08/04) female who presents at the request of McElwee, Lauren A, NP for evaluation of bilateral lower extremity telangiectasias. ? ?[name] is a mother of 5 children ranging from 21 to 80 years old.  Originally from [place], she immigrated over 30 years ago to the [place] with her husband.  Her oldest is involved in [employer]istic[employer] in [place] with the next oldest  (the daughter who presented with her today) having just graduated from [employer] in [place] planning to pursue a masters in public health at the [employer]. ? ?[name] describes bilateral lower extremity achy and heavy feeling by days end.  She notes a tired feeling.  She denies throbbing, burning, itching, but does have pain in the popliteal fossa's when lying in bed.  She denies bleeding or ulcerations.  No varicosities.  No history of venous procedures, no history of DVT.  She has appreciated a 30 pound weight gain. ? ?Past Medical History:  ?Diagnosis Date  ? Allergy   ? Depression   ? 12-13 years ago  ? Hemorrhoids   ? ? ?Past Surgical History:  ?Procedure Laterality Date  ? left ovarian cyst    ? TUBAL LIGATION Bilateral 11/01/2016  ? Procedure: POST PARTUM TUBAL LIGATION;  Surgeon: Willodean Rosenthal, MD;  Location: [facility];  Service: Gynecology;  Laterality: Bilateral;  ? ? ?Social History  ? ?Socioeconomic History  ? Marital status: Married  ?  Spouse name: Not on file  ? Number of children: Not on file  ? Years of education: Not on file  ? Highest education level: 9th grade  ?Occupational History  ? Not on file  ?Tobacco Use  ? Smoking status: Never  ? Smokeless tobacco: Never  ?Vaping Use  ? Vaping Use: Never used  ?Substance and Sexual Activity  ? Alcohol use: No  ? Drug use: No  ? Sexual activity: Yes  ?  Birth control/protection: Surgical  ?   Comment: BTL  ?Other Topics Concern  ? Not on file  ?Social History Narrative  ? Not on file  ? ?Social Determinants of Health  ? ?Financial Resource Strain: Not on file  ?Food Insecurity: Not on file  ?Transportation Needs: Not on file  ?Physical Activity: Not on file  ?Stress: Not on file  ?Social Connections: Not on file  ?Intimate Partner Violence: Not on file  ? ?Family History  ?Problem Relation Age of Onset  ? Hypertension Father   ? Hypertension Sister   ? Colon cancer Neg Hx   ? Colon polyps Neg Hx   ? Esophageal cancer Neg Hx   ? Stomach cancer Neg Hx   ? Rectal cancer Neg Hx   ? ? ?Current Outpatient Medications  ?Medication Sig Dispense Refill  ? acetaminophen (TYLENOL) 500 MG tablet Take 500 mg by mouth every 6 (six) hours as needed.    ? Calcium Carbonate (CALCIUM 600 PO) Take by mouth.    ? Cyanocobalamin (VITAMIN B 12 PO) Take by mouth.    ? cyclobenzaprine (FLEXERIL) 10 MG tablet Take 1 tablet (10 mg total) by mouth at bedtime. 30 tablet 0  ? hydrocortisone (PROCTOSOL HC) 2.5 % rectal cream Place 1 application rectally 2 (two) times daily. 30 g 2  ? magic mouthwash (nystatin, lidocaine, diphenhydrAMINE, alum & mag hydroxide) suspension Swish and swallow 5 mLs 4 (four) times daily  as needed for mouth pain. 180 mL 0  ? MAGNESIUM PO Take by mouth.    ? meloxicam (MOBIC) 7.5 MG tablet Take 1 tablet (7.5 mg total) by mouth daily. 30 tablet 0  ? VITAMIN D PO Take by mouth.    ? ?No current facility-administered medications for this visit.  ? ? ?No Known Allergies ? ? ?REVIEW OF SYSTEMS:  ?[X]  denotes positive finding, [ ]  denotes negative finding ?Cardiac  Comments:  ?Chest pain or chest pressure:    ?Shortness of breath upon exertion:    ?Short of breath when lying flat:    ?Irregular heart rhythm:    ?    ?Vascular    ?Pain in calf, thigh, or hip brought on by ambulation:    ?Pain in feet at night that wakes you up from your sleep:     ?Blood clot in your veins:    ?Leg swelling:     ?    ?Pulmonary     ?Oxygen at home:    ?Productive cough:     ?Wheezing:     ?    ?Neurologic    ?Sudden weakness in arms or legs:     ?Sudden numbness in arms or legs:     ?Sudden onset of difficulty speaking or slurred speech:    ?Temporary loss of vision in one eye:     ?Problems with dizziness:     ?    ?Gastrointestinal    ?Blood in stool:     ?Vomited blood:     ?    ?Genitourinary    ?Burning when urinating:     ?Blood in urine:    ?    ?Psychiatric    ?Major depression:     ?    ?Hematologic    ?Bleeding problems:    ?Problems with blood clotting too easily:    ?    ?Skin    ?Rashes or ulcers:    ?    ?Constitutional    ?Fever or chills:    ? ? ?PHYSICAL EXAMINATION: ? ?There were no vitals filed for this visit. ? ?General:  WDWN in NAD; vital signs documented above ?Gait: Not observed ?HENT: WNL, normocephalic ?Pulmonary: normal non-labored breathing , without Rales, rhonchi,  wheezing ?Cardiac: regular HR ?Abdomen: soft, NT, no masses ?Skin: without rashes ?Vascular Exam/Pulses: ? Right Left  ?Radial 2+ (normal) 2+ (normal)  ?Ulnar 2+ (normal) 2+ (normal)  ?Femoral    ?Popliteal    ?DP 2+ (normal) 2+ (normal)  ?PT 2+ (normal) 2+ (normal)  ? ?Extremities: without ischemic changes, without Gangrene , without cellulitis; without open wounds;  ?Telangiectasias appreciated throughout the calf.  No varicosities visible. ?Musculoskeletal: no muscle wasting or atrophy  ?Neurologic: A&O X 3;  No focal weakness or paresthesias are detected ?Psychiatric:  The pt has Normal affect. ? ? ?Non-Invasive Vascular Imaging:   ? ?Summary:  ?Left:  ?- No evidence of deep vein thrombosis seen in the left lower extremity,  ?from the common femoral through the popliteal veins.  ?- No evidence of superficial venous thrombosis in the left lower  ?extremity.  ?   ?- Venous reflux is noted in the left popliteal vein.  ?- Venous reflux is noted in the left short saphenous vein.  ? ? ? ?ASSESSMENT/PLAN:: 46 y.o. female presenting with pain in bilateral  lower extremities most appreciated in the evenings after working on her feet all day.  Amount of 5, she notes achy, heaviness, tired feeling  in bilateral calves by days end.  She has been wearing compressions which have been beneficial duplex ultrasonography of the left leg was reviewed demonstrating no reflux within the greater saphenous vein, some reflux in the short saphenous vein, some reflux in the popliteal vein. ? ?I had a long conversation with [name] guarding the above.  Being that her greater saphenous vein is normal, she would not benefit from ablation therapy.  We talked at length about her recent weight gain and the difficulties of raising children as well as taking care of herself.  She is going to try to work on weight loss, and plans to continue wearing her compression stockings. ? ?I told both [name] and her daughter, that I am more than happy to see her should any questions or concerns arise. ?She can follow-up with my office as needed. ? ?Victorino SparrowJoshua E Jasiyah Paulding, MD ?[facility] ?[phone] ? ?

## 2021-12-27 ENCOUNTER — Encounter: Payer: Self-pay | Admitting: Vascular Surgery

## 2021-12-27 ENCOUNTER — Ambulatory Visit (HOSPITAL_COMMUNITY)
Admission: RE | Admit: 2021-12-27 | Discharge: 2021-12-27 | Disposition: A | Discharge status: Home / Self Care | Payer: 59 | Source: Ambulatory Visit | Attending: Vascular Surgery | Admitting: Vascular Surgery

## 2021-12-27 ENCOUNTER — Ambulatory Visit: Payer: 59 | Admitting: Vascular Surgery

## 2021-12-27 VITALS — BP 108/70 | HR 82 | Temp 98.1°F | Resp 20 | Ht 61.0 in | Wt 188.8 lb

## 2021-12-27 DIAGNOSIS — I8393 Asymptomatic varicose veins of bilateral lower extremities: Secondary | ICD-10-CM

## 2021-12-27 DIAGNOSIS — I872 Venous insufficiency (chronic) (peripheral): Secondary | ICD-10-CM

## 2022-01-10 ENCOUNTER — Ambulatory Visit: Payer: 59 | Admitting: Nurse Practitioner

## 2022-01-10 ENCOUNTER — Ambulatory Visit (INDEPENDENT_AMBULATORY_CARE_PROVIDER_SITE_OTHER): Payer: 59

## 2022-01-10 ENCOUNTER — Encounter: Payer: Self-pay | Admitting: Nurse Practitioner

## 2022-01-10 VITALS — BP 118/84 | HR 68 | Temp 97.0°F | Wt 189.4 lb

## 2022-01-10 DIAGNOSIS — M79672 Pain in left foot: Secondary | ICD-10-CM

## 2022-01-10 DIAGNOSIS — H60501 Unspecified acute noninfective otitis externa, right ear: Secondary | ICD-10-CM | POA: Diagnosis not present

## 2022-01-10 MED ORDER — PREDNISONE 20 MG PO TABS: 20.0000 mg | ORAL_TABLET | Freq: Every day | ORAL | 0 refills | Status: DC

## 2022-01-10 MED ORDER — NEOMYCIN-POLYMYXIN-HC 3.5-10000-1 OT SOLN: 4.0000 [drp] | Freq: Four times a day (QID) | OTIC | 0 refills | Status: DC

## 2022-01-10 NOTE — Patient Instructions (Addendum)
It was great to see you!  We are going to check an x-ray of your left heel. Start prednsione 1 talbet daily for 5 days in the morning with food. You can continue ibuprofen or meloxicam as needed for pain. I recommend putting a water bottle in the freezer with water and then rolling your foot over it several times per day.   Start 3-4 drops in your right ear 3-4 times a day for 7 days to help with the ear pain and itching.   Let's follow-up in 2 weeks, sooner if you have concerns.  If a referral was placed today, you will be contacted for an appointment. Please note that routine referrals can sometimes take up to 3-4 weeks to process. Please call our office if you haven't heard anything after this time frame.  Take care,  Rodman Pickle, NP

## 2022-01-10 NOTE — Progress Notes (Signed)
Acute Office Visit  Subjective:     Patient ID: Barbara Coffey, female    DOB: 06/19/1976, 46 y.o.   MRN: [id number]  Chief Complaint  Patient presents with   Right and left heel pain    Patient reports pain started two months ago and is worse in the left heel than the right. Patient states that it feels like a needle in her heels. Denies injury    HPI Patient is in today for bilateral heel pain, left worse than the right and right ear pain and itching.  She has been having heel pain for the last 2 months, however it worsened over the last 3 weeks.  Her left heel is worse than her right.  She is having trouble walking, especially after she has been sitting for long periods of time or first thing in the morning.  She has noticed some swelling on the sides of her left heel, no bruising, injury.  She describes it as a needle sensation.  She has been trying ibuprofen, Advil, meloxicam which has not been helping with the pain.  She has also noticed right ear itching and pain intermittently for the last few days.  She denies fevers, nasal congestion, sore throat, difficulty hearing.  She has not tried anything for this.   ROS See pertinent positives and negatives per HPI.     Objective:    BP 118/84 (BP Location: Left Arm, Patient Position: Sitting, Cuff Size: Large)   Pulse 68   Temp (!) 97 F (36.1 C) (Temporal)   Wt 189 lb 6.4 oz (85.9 kg)   LMP 12/12/2021   SpO2 98%   BMI 35.79 kg/m    Physical Exam Vitals and nursing note reviewed.  Constitutional:      General: She is not in acute distress.    Appearance: Normal appearance.  HENT:     Head: Normocephalic.     Right Ear: Tympanic membrane and external ear normal.     Left Ear: Tympanic membrane, ear canal and external ear normal.     Ears:     Comments: Redness in her right ear canal Eyes:     Conjunctiva/sclera: Conjunctivae normal.  Cardiovascular:     Rate and Rhythm: Normal rate and regular rhythm.      Pulses: Normal pulses.     Heart sounds: Normal heart sounds.  Pulmonary:     Effort: Pulmonary effort is normal.     Breath sounds: Normal breath sounds.  Musculoskeletal:        General: Tenderness present.     Cervical back: Normal range of motion.     Comments: Tenderness and swelling to lateral and medial calcaneus area of heel  Skin:    General: Skin is warm.  Neurological:     General: No focal deficit present.     Mental Status: She is alert and oriented to person, place, and time.  Psychiatric:        Mood and Affect: Mood normal.        Behavior: Behavior normal.        Thought Content: Thought content normal.        Judgment: Judgment normal.      Assessment & Plan:   Problem List Items Addressed This Visit       Other   Pain of left heel - Primary    She has been having chronic pain to her left heel worse than her right for the past 2 months, however  it worsened in the last 3 weeks.  We will check x-ray of her foot.  Most likely from plantar fasciitis, with pain worsening after sitting and in the mornings.  Gave her some stretches to do daily, discussed rolling her foot over and iced water bottle several times a day.  We will start prednisone 20 mg for 5 days.  Follow-up in 2 weeks.       Relevant Orders   DG Foot Complete Left   Other Visit Diagnoses     Acute otitis externa of right ear, unspecified type       Start cortisporin otic QID x 7 days. Follow-up if symptoms worsen or don't improve.       Meds ordered this encounter  Medications   neomycin-polymyxin-hydrocortisone (CORTISPORIN) OTIC solution    Sig: Place 4 drops into the right ear 4 (four) times daily.    Dispense:  10 mL    Refill:  0   predniSONE (DELTASONE) 20 MG tablet    Sig: Take 1 tablet (20 mg total) by mouth daily with breakfast.    Dispense:  5 tablet    Refill:  0    Return in about 2 weeks (around 01/24/2022) for plantar fasciitis.  Gerre Scull, NP

## 2022-01-10 NOTE — Assessment & Plan Note (Signed)
She has been having chronic pain to her left heel worse than her right for the past 2 months, however it worsened in the last 3 weeks.  We will check x-ray of her foot.  Most likely from plantar fasciitis, with pain worsening after sitting and in the mornings.  Gave her some stretches to do daily, discussed rolling her foot over and iced water bottle several times a day.  We will start prednisone 20 mg for 5 days.  Follow-up in 2 weeks.

## 2022-01-14 NOTE — Progress Notes (Signed)
Interpreter services was used during this phone call. Called and informed patient of results and provider instructions. Patient voiced understanding.

## 2022-01-15 MED ORDER — PREDNISONE 10 MG PO TABS: ORAL_TABLET | ORAL | 0 refills | Status: DC

## 2022-01-15 NOTE — Addendum Note (Signed)
Addended by: Rodman Pickle A on: 01/15/2022 05:04 PM   Modules accepted: Orders

## 2022-01-29 ENCOUNTER — Ambulatory Visit: Payer: 59 | Admitting: Nurse Practitioner

## 2022-03-20 ENCOUNTER — Ambulatory Visit: Payer: 59 | Admitting: Nurse Practitioner

## 2023-04-22 ENCOUNTER — Ambulatory Visit (INDEPENDENT_AMBULATORY_CARE_PROVIDER_SITE_OTHER): Payer: Self-pay | Admitting: Internal Medicine

## 2023-04-22 ENCOUNTER — Encounter: Payer: Self-pay | Admitting: Internal Medicine

## 2023-04-22 VITALS — BP 110/80 | HR 64 | Temp 98.1°F | Ht 61.0 in | Wt 192.0 lb

## 2023-04-22 DIAGNOSIS — M7712 Lateral epicondylitis, left elbow: Secondary | ICD-10-CM

## 2023-04-22 MED ORDER — MELOXICAM 7.5 MG PO TABS: 7.5000 mg | ORAL_TABLET | Freq: Every day | ORAL | 0 refills | Status: AC

## 2023-04-22 NOTE — Patient Instructions (Signed)
Get elbow brace  Ice elbow  Rest, avoid lifting heavy objects

## 2023-04-22 NOTE — Progress Notes (Signed)
[facility] [address] [place] [address] Dept: [phone] Dept Fax: [phone]  Acute Care Office Visit  Subjective:   Barbara Coffey 01-11-1976 04/22/2023  Chief Complaint  Patient presents with   Extremity Weakness    Started last week Left arm pain and hip pain comes and goes usually around menstrual ,  arm weakness started 3 days ago     HPI: Barbara Coffey is a 47 yo F who complains of left elbow pain onset 1 week ago. Reports left arm feels heavy. Reports pain has continually worsened, now pain with gripping or lifting heavy objects. Pain radiates from left thumb upwards to left elbow. Associated tingling sensation in hand and mid back pain.  Has been taking ibuprofen with little relief. Denies any recent falls or injuries. Denies fever, chills, CP, SHOB, palpitations, N/V, slurred speech or facial droop, gait difficulty, dizziness, vision changes, lower extremity weakness.    The following portions of the patient's history were reviewed and updated as appropriate: past medical history, past surgical history, family history, social history, allergies, medications, and problem list.   Patient Active Problem List   Diagnosis Date Noted   Pain of left heel 01/10/2022   No pertinent past medical history 11/21/2021   [place] 10/25/2021   Visit for routine gyn exam 10/23/2021   Screening mammogram, encounter for 10/23/2021   Hemorrhoids 09/20/2021   Spider veins of both lower extremities 09/20/2021   Obesity (BMI 30-39.9) 09/20/2021   Past Medical History:  Diagnosis Date   Allergy    Depression    12-13 years ago   Hemorrhoids    Past Surgical History:  Procedure Laterality Date   left ovarian cyst     TUBAL LIGATION Bilateral 11/01/2016   Procedure: POST PARTUM TUBAL LIGATION;  Surgeon: Willodean Rosenthal, MD;  Location: [facility];  Service: Gynecology;  Laterality: Bilateral;   Family  History  Problem Relation Age of Onset   Hypertension Father    Hypertension Sister    Colon cancer Neg Hx    Colon polyps Neg Hx    Esophageal cancer Neg Hx    Stomach cancer Neg Hx    Rectal cancer Neg Hx     Current Outpatient Medications:    acetaminophen (TYLENOL) 500 MG tablet, Take 500 mg by mouth every 6 (six) hours as needed., Disp: , Rfl:    Calcium Carbonate (CALCIUM 600 PO), Take by mouth., Disp: , Rfl:    Cyanocobalamin (VITAMIN B 12 PO), Take by mouth., Disp: , Rfl:    hydrocortisone (PROCTOSOL HC) 2.5 % rectal cream, Place 1 application rectally 2 (two) times daily., Disp: 30 g, Rfl: 2   meloxicam (MOBIC) 7.5 MG tablet, Take 1 tablet (7.5 mg total) by mouth daily., Disp: 30 tablet, Rfl: 0   VITAMIN D PO, Take by mouth., Disp: , Rfl:    cyclobenzaprine (FLEXERIL) 10 MG tablet, Take 1 tablet (10 mg total) by mouth at bedtime. (Patient not taking: Reported on 04/22/2023), Disp: 30 tablet, Rfl: 0   magic mouthwash (nystatin, lidocaine, diphenhydrAMINE, alum & mag hydroxide) suspension, Swish and swallow 5 mLs 4 (four) times daily as needed for mouth pain. (Patient not taking: Reported on 04/22/2023), Disp: 180 mL, Rfl: 0   MAGNESIUM PO, Take by mouth. (Patient not taking: Reported on 04/22/2023), Disp: , Rfl:    neomycin-polymyxin-hydrocortisone (CORTISPORIN) OTIC solution, Place 4 drops into the right ear 4 (four) times daily. (Patient not taking: Reported on 04/22/2023), Disp: 10  mL, Rfl: 0   predniSONE (DELTASONE) 10 MG tablet, Take 6 tablets today, then 5 tablets tomorrow, then decrease by 1 tablet every day until gone (Patient not taking: Reported on 04/22/2023), Disp: 21 tablet, Rfl: 0 No Known Allergies   ROS: A complete ROS was performed with pertinent positives/negatives noted in the HPI. The remainder of the ROS are negative.    Objective:   Today's Vitals   04/22/23 0810  BP: 110/80  Pulse: 64  Temp: 98.1 F (36.7 C)  TempSrc: Temporal  SpO2: 98%  Weight: 192 lb  (87.1 kg)  Height: 5\' 1"  (1.549 m)    GENERAL: Well-appearing, in NAD. Well nourished.  SKIN: Pink, warm and dry. No rash, lesion, ulceration, or ecchymoses.  RESPIRATORY: Chest wall symmetrical. Respirations even and non-labored. Breath sounds clear to auscultation bilaterally.  CARDIAC: S1, S2 present, regular rate and rhythm. Peripheral pulses 2+ bilaterally.  MSK: Muscle tone and strength appropriate for age. TTP to lateral epicondyle, pain with wrist extension and flexion with elbow extension EXTREMITIES: Without clubbing, cyanosis, or edema.  NEUROLOGIC: CN 2-12 intact. EOMI. No motor or sensory deficits. Steady, even gait. 5/5 grip strength.  PSYCH/MENTAL STATUS: Alert, oriented x 3. Cooperative, appropriate mood and affect.   EKG RESULT: EKG tracing is personally reviewed.   EKG: sinus bradycardia.    No results found for any visits on 04/22/23.    Assessment & Plan:  1. Lateral epicondylitis of left elbow - EKG 12-Lead - meloxicam (MOBIC) 7.5 MG tablet; Take 1 tablet (7.5 mg total) by mouth daily.  Dispense: 30 tablet; Refill: 0 - ice , rest  - obtain elbow brace    Conducted EKG to rule out cardiac causes d/t nature of patient CC, symptoms, and middle age female.   Meds ordered this encounter  Medications   meloxicam (MOBIC) 7.5 MG tablet    Sig: Take 1 tablet (7.5 mg total) by mouth daily.    Dispense:  30 tablet    Refill:  0    Order Specific Question:   Supervising Provider    Answer:   Garnette Gunner [[id number]]   Orders Placed This Encounter  Procedures   EKG 12-Lead   Lab Orders  No laboratory test(s) ordered today   No images are attached to the encounter or orders placed in the encounter.  Return if symptoms worsen or fail to improve.   Salvatore Decent, FNP

## 2023-05-09 IMAGING — MG MM DIGITAL SCREENING BILAT W/ TOMO AND CAD
8 series · 8 of 24 positions shown · non-contrast
Comparison: Previous exam(s).

CLINICAL DATA: Screening.

EXAM:
DIGITAL SCREENING BILATERAL MAMMOGRAM WITH TOMOSYNTHESIS AND CAD
TECHNIQUE: Bilateral screening digital craniocaudal and mediolateral oblique
mammograms were obtained. Bilateral screening digital breast
tomosynthesis was performed. The images were evaluated with
computer-aided detection.

[L CC synth-2D]
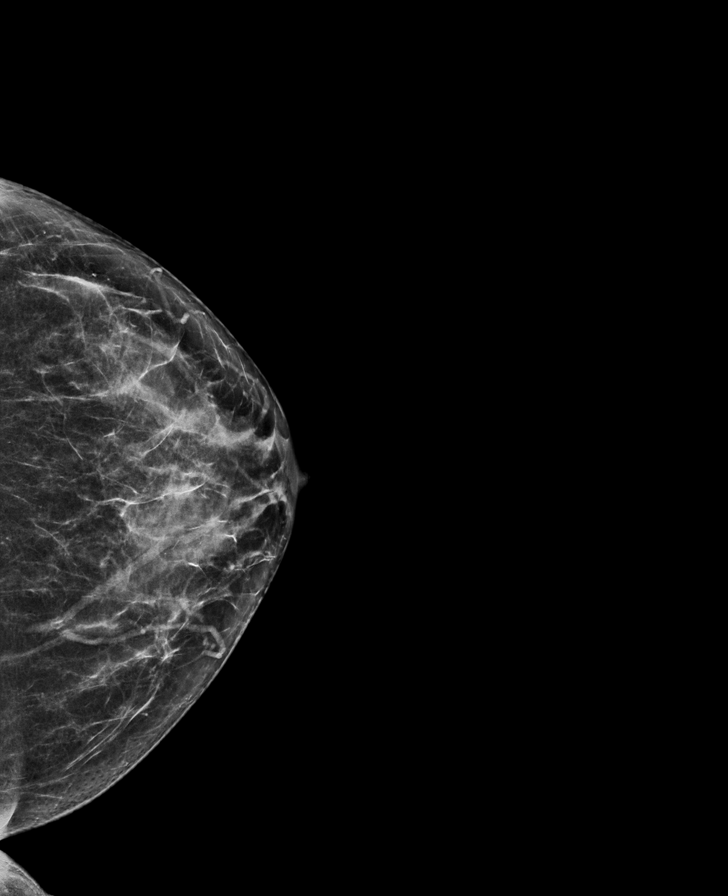

[L MLO synth-2D]
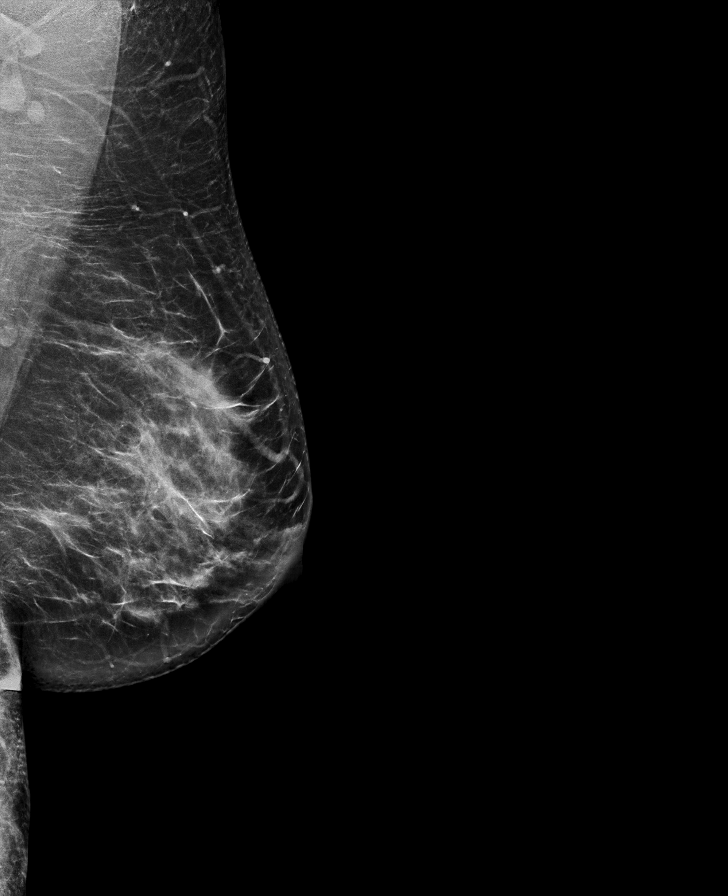

[R MLO synth-2D]
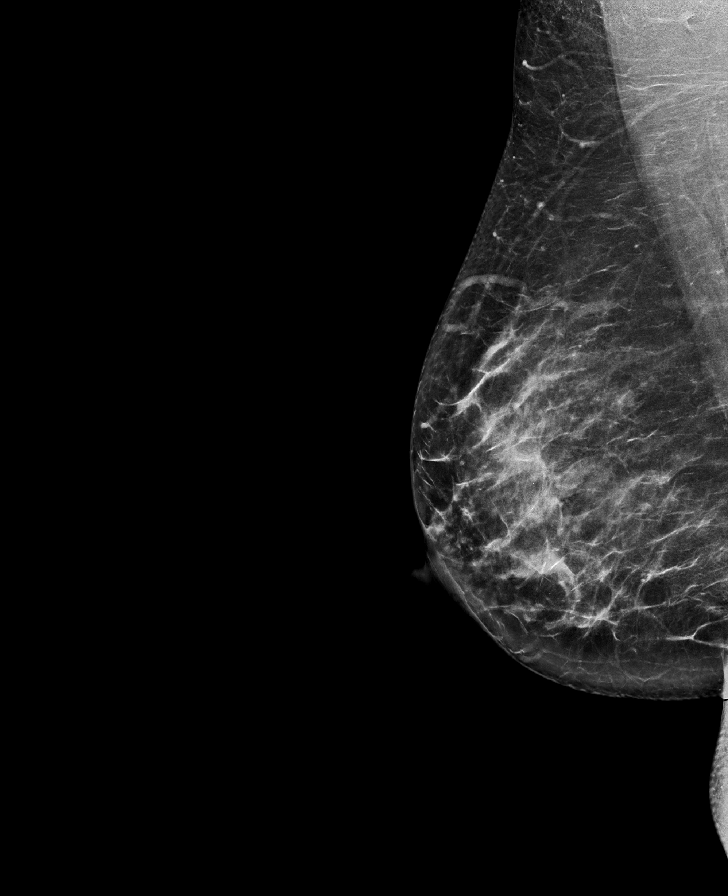

[R CC synth-2D]
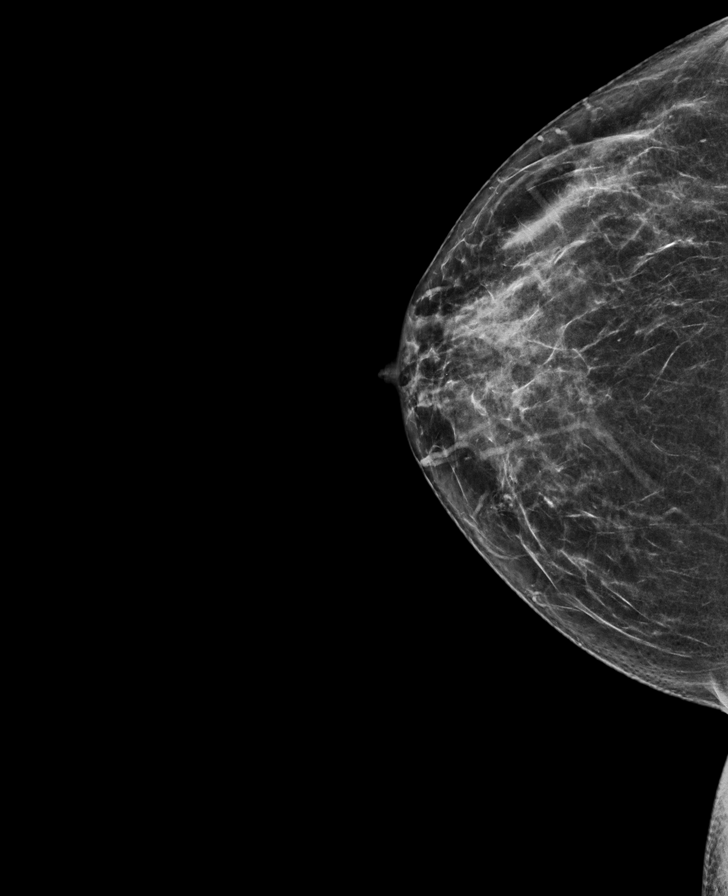

[L CC tomo · tomo slice 33/64.0]
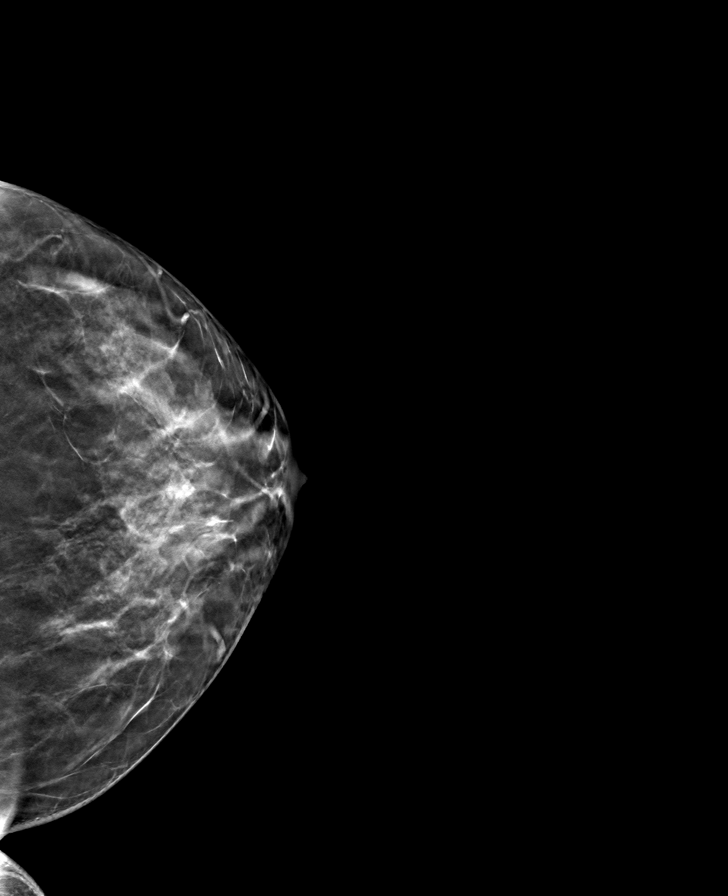

[L MLO tomo · tomo slice 40/79.0]
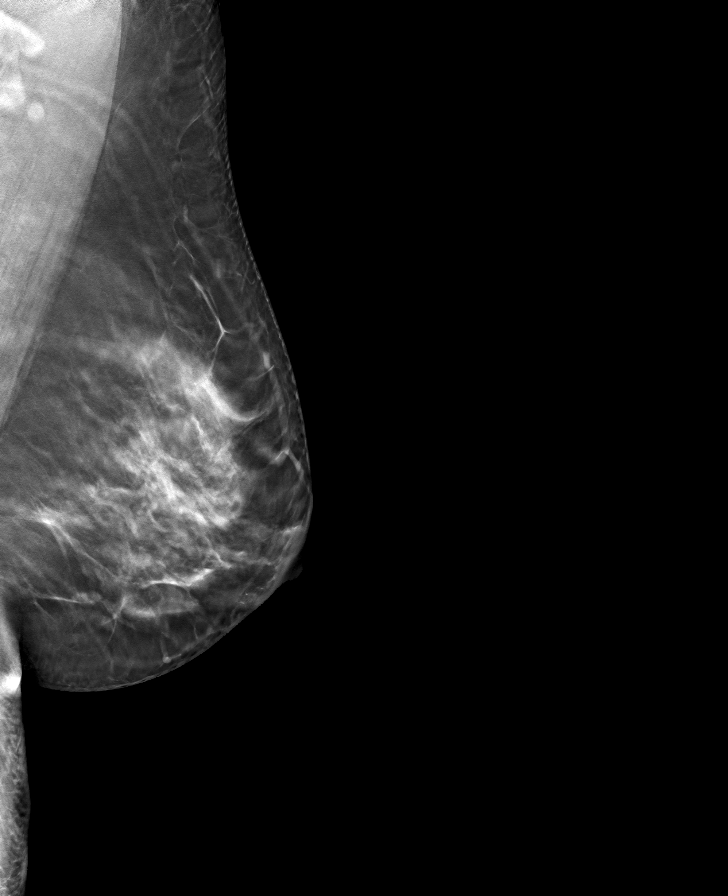

[R CC tomo · tomo slice 32/63.0]
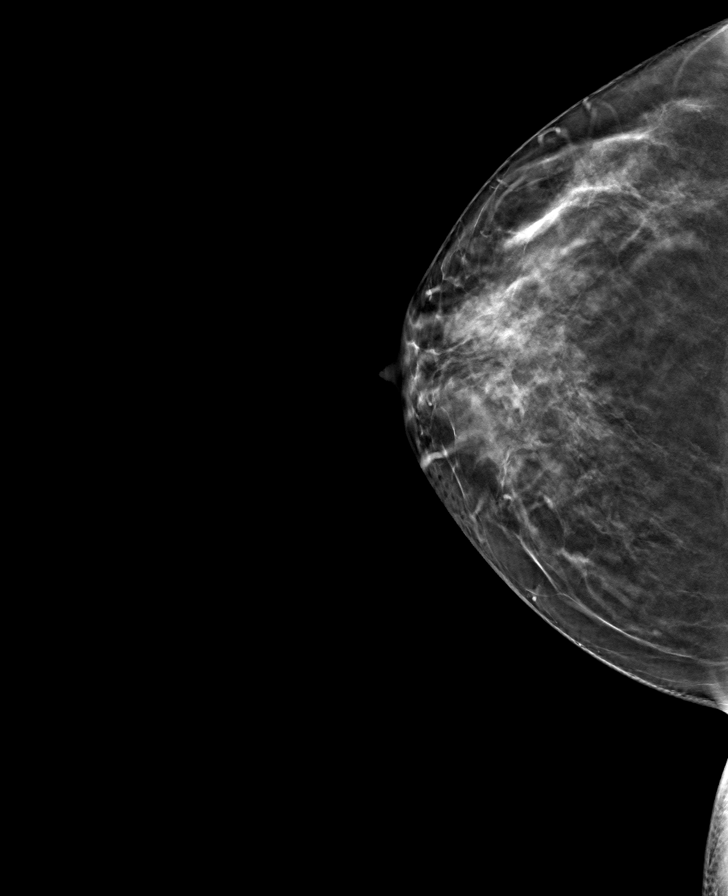

[R MLO tomo · tomo slice 39/77.0]
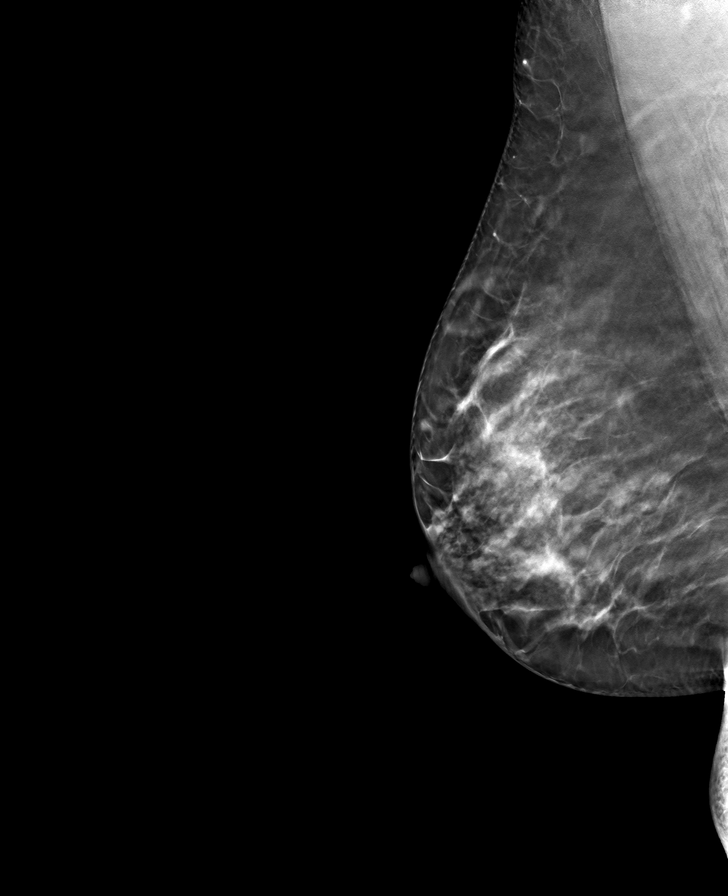

[8 of 24 positions shown; findings below may reference images not displayed]

ACR Breast Density Category c: The breast tissue is heterogeneously
dense, which may obscure small masses.
FINDINGS: There are no findings suspicious for malignancy.
IMPRESSION: No mammographic evidence of malignancy. A result letter of this
screening mammogram will be mailed directly to the patient.

RECOMMENDATION:
Screening mammogram in one year. (Code:Q3-W-BC3)

BI-RADS CATEGORY  1: Negative.

## 2023-11-18 ENCOUNTER — Encounter: Payer: Self-pay | Admitting: Nurse Practitioner

## 2023-11-18 ENCOUNTER — Ambulatory Visit (INDEPENDENT_AMBULATORY_CARE_PROVIDER_SITE_OTHER): Payer: Self-pay | Admitting: Nurse Practitioner

## 2023-11-18 VITALS — BP 118/80 | HR 81 | Temp 97.4°F | Ht 61.0 in | Wt 193.2 lb

## 2023-11-18 DIAGNOSIS — R5383 Other fatigue: Secondary | ICD-10-CM

## 2023-11-18 DIAGNOSIS — M2550 Pain in unspecified joint: Secondary | ICD-10-CM | POA: Diagnosis not present

## 2023-11-18 DIAGNOSIS — N951 Menopausal and female climacteric states: Secondary | ICD-10-CM | POA: Diagnosis not present

## 2023-11-18 DIAGNOSIS — Z1231 Encounter for screening mammogram for malignant neoplasm of breast: Secondary | ICD-10-CM

## 2023-11-18 DIAGNOSIS — E669 Obesity, unspecified: Secondary | ICD-10-CM

## 2023-11-18 DIAGNOSIS — Z0000 Encounter for general adult medical examination without abnormal findings: Secondary | ICD-10-CM | POA: Diagnosis not present

## 2023-11-18 DIAGNOSIS — M542 Cervicalgia: Secondary | ICD-10-CM

## 2023-11-18 DIAGNOSIS — E782 Mixed hyperlipidemia: Secondary | ICD-10-CM | POA: Diagnosis not present

## 2023-11-18 LAB — COMPREHENSIVE METABOLIC PANEL WITH GFR
ALT: 20 U/L (ref 0–35)
AST: 16 U/L (ref 0–37)
Albumin: 4.5 g/dL (ref 3.5–5.2)
Alkaline Phosphatase: 78 U/L (ref 39–117)
BUN: 11 mg/dL (ref 6–23)
CO2: 29 meq/L (ref 19–32)
Calcium: 9.2 mg/dL (ref 8.4–10.5)
Chloride: 101 meq/L (ref 96–112)
Creatinine, Ser: 0.48 mg/dL (ref 0.40–1.20)
GFR: 110.91 mL/min (ref >60.00)
Glucose, Bld: 89 mg/dL (ref 70–99)
Potassium: 3.8 meq/L (ref 3.5–5.1)
Sodium: 138 meq/L (ref 135–145)
Total Bilirubin: 0.5 mg/dL (ref 0.2–1.2)
Total Protein: 7.6 g/dL (ref 6.0–8.3)

## 2023-11-18 LAB — CBC WITH DIFFERENTIAL/PLATELET
Basophils Absolute: 0 10*3/uL (ref 0.0–0.1)
Basophils Relative: 0.5 % (ref 0.0–3.0)
Eosinophils Absolute: 0.1 10*3/uL (ref 0.0–0.7)
Eosinophils Relative: 1.9 % (ref 0.0–5.0)
HCT: 41.6 % (ref 36.0–46.0)
Hemoglobin: 13.8 g/dL (ref 12.0–15.0)
Lymphocytes Relative: 33.4 % (ref 12.0–46.0)
Lymphs Abs: 1.8 10*3/uL (ref 0.7–4.0)
MCHC: 33.1 g/dL (ref 30.0–36.0)
MCV: 89.1 fl (ref 78.0–100.0)
Monocytes Absolute: 0.3 10*3/uL (ref 0.1–1.0)
Monocytes Relative: 6 % (ref 3.0–12.0)
Neutro Abs: 3.1 10*3/uL (ref 1.4–7.7)
Neutrophils Relative %: 58.2 % (ref 43.0–77.0)
Platelets: 256 10*3/uL (ref 150.0–400.0)
RBC: 4.67 Mil/uL (ref 3.87–5.11)
RDW: 14.2 % (ref 11.5–15.5)
WBC: 5.3 10*3/uL (ref 4.0–10.5)

## 2023-11-18 LAB — LIPID PANEL
Cholesterol: 185 mg/dL (ref 0–200)
HDL: 50.9 mg/dL (ref >39.00)
LDL Cholesterol: 107 mg/dL — ABNORMAL HIGH (ref 0–99)
NonHDL: 133.97
Total CHOL/HDL Ratio: 4
Triglycerides: 137 mg/dL (ref 0.0–149.0)
VLDL: 27.4 mg/dL (ref 0.0–40.0)

## 2023-11-18 LAB — TSH: TSH: 1.03 u[IU]/mL (ref 0.35–5.50)

## 2023-11-18 LAB — HEMOGLOBIN A1C: Hgb A1c MFr Bld: 5.6 % (ref 4.6–6.5)

## 2023-11-18 LAB — VITAMIN B12: Vitamin B-12: 443 pg/mL (ref 211–911)

## 2023-11-18 LAB — VITAMIN D 25 HYDROXY (VIT D DEFICIENCY, FRACTURES): VITD: 22.44 ng/mL — ABNORMAL LOW (ref 30.00–100.00)

## 2023-11-18 NOTE — Patient Instructions (Signed)
 It was great to see you!  We are checking your labs today and will let you know the results via mychart/phone.   You can use a cotton ball soaked in mineral oil and place in your ear to help with itching   You can keep taking the supplements.  I have ordered a mammogram, they will call to schedule   Let's follow-up in 1 year, sooner if you have concerns.  If a referral was placed today, you will be contacted for an appointment. Please note that routine referrals can sometimes take up to 3-4 weeks to process. Please call our office if you haven't heard anything after this time frame.  Take care,  Rodman Pickle, NP

## 2023-11-18 NOTE — Assessment & Plan Note (Signed)
 She is experiencing fatigue, anxiety, and multiple joint pain. Menses are still regular. She has been taking an estroven and aswhaghanda supplement over the counter. She can continue these. She declines prescription medication at this point.

## 2023-11-18 NOTE — Assessment & Plan Note (Signed)
 Chronic, left sided neck pain. No red flags on exam. Can continue lidocaine patches as needed. Stretches given to do daily. If not improving, will get imaging.

## 2023-11-18 NOTE — Assessment & Plan Note (Addendum)
 She has been having multiple joint pain in her fingers, wrists, elbows, and hips bilaterally. Concern for arthritis vs perimenopause symptoms. Check CMP, CBC, TSH, vitamin B12, ANA, and RF today. Can continue tylenol or ibuprofen as needed for pain.

## 2023-11-18 NOTE — Assessment & Plan Note (Signed)
Health maintenance reviewed and updated. Discussed nutrition, exercise. Follow-up 1 year.

## 2023-11-18 NOTE — Assessment & Plan Note (Signed)
Chronic, stable.  Check CMP, CBC, lipid panel today. 

## 2023-11-18 NOTE — Progress Notes (Signed)
 BP 118/80 (BP Location: Left Arm, Patient Position: Sitting, Cuff Size: Normal)   Pulse 81   Temp (!) 97.4 F (36.3 C)   Ht 5\' 1"  (1.549 m)   Wt 193 lb 3.2 oz (87.6 kg)   LMP 11/03/2023 (Approximate)   SpO2 98%   Breastfeeding No   BMI 36.50 kg/m    Subjective:    Patient ID: Barbara Coffey, female    DOB: 1976/05/05, 48 y.o.   MRN: [id number]  CC: Chief Complaint  Patient presents with   Annual Exam    With fasting lab work, no concerns, mammogram    HPI: Barbara Coffey is a 48 y.o. female presenting on 11/18/2023 for comprehensive medical examination. Current medical complaints include: fatigue, joint pain, neck pain  She has been experiencing fatigue and anxiety for the last several months. She states that she sleeps well. She notes her husband does say she snores at times. She does not fall asleep during the day.   She has also been experiencing posterior and left sided neck pain and multiple joint pain in her fingers, wrists, elbows, and hips. She thinks this may be related to perimenopause and has been taking estroven supplement which has helped some. She also has been using a lidocaine patch on her neck which helps.   She currently lives with: husband Menopausal Symptoms: yes  Depression and Anxiety Screen done today and results listed below:     11/18/2023    9:01 AM 04/22/2023    8:08 AM 10/23/2021    8:16 AM 10/18/2021    3:53 PM 09/20/2021    9:56 AM  Depression screen PHQ 2/9  Decreased Interest 3 0 0 0 0  Down, Depressed, Hopeless 1 0 0 0 1  PHQ - 2 Score 4 0 0 0 1  Altered sleeping 3  0  0  Tired, decreased energy 3  1  1   Change in appetite 0  2  2  Feeling bad or failure about yourself  0  0  0  Trouble concentrating 0  0  0  Moving slowly or fidgety/restless 0  0  1  Suicidal thoughts 0  0  0  PHQ-9 Score 10  3  5   Difficult doing work/chores Not difficult at all  Not difficult at all  Not difficult at all      11/18/2023    9:02 AM  09/20/2021    9:57 AM  GAD 7 : Generalized Anxiety Score  Nervous, Anxious, on Edge 3 0  Control/stop worrying 0 0  Worry too much - different things 0 0  Trouble relaxing 0 0  Restless 0 1  Easily annoyed or irritable 2 0  Afraid - awful might happen 0 0  Total GAD 7 Score 5 1  Anxiety Difficulty Not difficult at all Not difficult at all    The patient does not have a history of falls. I did not complete a risk assessment for falls. A plan of care for falls was not documented.   Past Medical History:  Past Medical History:  Diagnosis Date   Allergy    Depression    12-13 years ago   Hemorrhoids     Surgical History:  Past Surgical History:  Procedure Laterality Date   left ovarian cyst     TUBAL LIGATION Bilateral 11/01/2016   Procedure: POST PARTUM TUBAL LIGATION;  Surgeon: Willodean Rosenthal, MD;  Location: [facility];  Service: Gynecology;  Laterality: Bilateral;    Medications:  Current Outpatient Medications on File Prior to Visit  Medication Sig   acetaminophen (TYLENOL) 500 MG tablet Take 500 mg by mouth every 6 (six) hours as needed.   Calcium Carbonate (CALCIUM 600 PO) Take by mouth.   Cyanocobalamin (VITAMIN B 12 PO) Take by mouth.   cyclobenzaprine (FLEXERIL) 10 MG tablet Take 1 tablet (10 mg total) by mouth at bedtime. (Patient not taking: Reported on 04/22/2023)   hydrocortisone (PROCTOSOL HC) 2.5 % rectal cream Place 1 application rectally 2 (two) times daily.   MAGNESIUM PO Take by mouth. (Patient not taking: Reported on 04/22/2023)   meloxicam (MOBIC) 7.5 MG tablet Take 1 tablet (7.5 mg total) by mouth daily.   VITAMIN D PO Take by mouth.   No current facility-administered medications on file prior to visit.    Allergies:  No Known Allergies  Social History:  Social History   Socioeconomic History   Marital status: Married    Spouse name: Not on file   Number of children: Not on file   Years of education: Not on file   Highest education level: 9th  grade  Occupational History   Not on file  Tobacco Use   Smoking status: Never    Passive exposure: Never   Smokeless tobacco: Never  Vaping Use   Vaping status: Never Used  Substance and Sexual Activity   Alcohol use: No   Drug use: No   Sexual activity: Yes    Birth control/protection: Surgical    Comment: BTL  Other Topics Concern   Not on file  Social History Narrative   Not on file   Social Drivers of Health   Financial Resource Strain: Not on file  Food Insecurity: Not on file  Transportation Needs: No Transportation Needs (08/30/2019)   PRAPARE - Administrator, Civil Service (Medical): No    Lack of Transportation (Non-Medical): No  Physical Activity: Not on file  Stress: Not on file  Social Connections: Not on file  Intimate Partner Violence: Not on file   Social History   Tobacco Use  Smoking Status Never   Passive exposure: Never  Smokeless Tobacco Never   Social History   Substance and Sexual Activity  Alcohol Use No    Family History:  Family History  Problem Relation Age of Onset   Hypertension Father    Hypertension Sister    Colon cancer Neg Hx    Colon polyps Neg Hx    Esophageal cancer Neg Hx    Stomach cancer Neg Hx    Rectal cancer Neg Hx     Past medical history, surgical history, medications, allergies, family history and social history reviewed with patient today and changes made to appropriate areas of the chart.   Review of Systems  Constitutional:  Positive for malaise/fatigue. Negative for fever.  HENT: Negative.    Eyes: Negative.   Respiratory: Negative.    Cardiovascular: Negative.   Gastrointestinal: Negative.   Genitourinary: Negative.   Musculoskeletal:  Positive for joint pain and neck pain.  Skin: Negative.   Neurological: Negative.   Psychiatric/Behavioral:  The patient is nervous/anxious.    All other ROS negative except what is listed above and in the HPI.      Objective:    BP 118/80 (BP  Location: Left Arm, Patient Position: Sitting, Cuff Size: Normal)   Pulse 81   Temp (!) 97.4 F (36.3 C)   Ht 5\' 1"  (1.549 m)   Wt 193 lb 3.2 oz (  87.6 kg)   LMP 11/03/2023 (Approximate)   SpO2 98%   Breastfeeding No   BMI 36.50 kg/m   Wt Readings from Last 3 Encounters:  11/18/23 193 lb 3.2 oz (87.6 kg)  04/22/23 192 lb (87.1 kg)  01/10/22 189 lb 6.4 oz (85.9 kg)    Physical Exam Vitals and nursing note reviewed.  Constitutional:      General: She is not in acute distress.    Appearance: Normal appearance.  HENT:     Head: Normocephalic and atraumatic.     Right Ear: Tympanic membrane, ear canal and external ear normal.     Left Ear: Tympanic membrane, ear canal and external ear normal.     Mouth/Throat:     Mouth: Mucous membranes are moist.     Pharynx: No posterior oropharyngeal erythema.  Eyes:     Conjunctiva/sclera: Conjunctivae normal.  Cardiovascular:     Rate and Rhythm: Normal rate and regular rhythm.     Pulses: Normal pulses.     Heart sounds: Normal heart sounds.  Pulmonary:     Effort: Pulmonary effort is normal.     Breath sounds: Normal breath sounds.  Abdominal:     Palpations: Abdomen is soft.     Tenderness: There is no abdominal tenderness.  Musculoskeletal:        General: Tenderness (left posterior neck) present. Normal range of motion.     Cervical back: Normal range of motion and neck supple.     Right lower leg: No edema.     Left lower leg: No edema.  Lymphadenopathy:     Cervical: No cervical adenopathy.  Skin:    General: Skin is warm and dry.  Neurological:     General: No focal deficit present.     Mental Status: She is alert and oriented to person, place, and time.     Cranial Nerves: No cranial nerve deficit.     Coordination: Coordination normal.     Gait: Gait normal.  Psychiatric:        Mood and Affect: Mood normal.        Behavior: Behavior normal.        Thought Content: Thought content normal.        Judgment: Judgment  normal.     Results for orders placed or performed in visit on 10/25/21  POCT rapid strep A   Collection Time: 10/25/21  4:59 PM  Result Value Ref Range   Rapid Strep A Screen Negative Negative      Assessment & Plan:   Problem List Items Addressed This Visit       Other   Obesity (BMI 30-39.9)   BMI 36.5. Recommend focus on nutrition and exercise. She has been trying to change her diet. Check A1c, TSH, and vitamin D today.       Relevant Orders   Hemoglobin A1c   VITAMIN D 25 Hydroxy (Vit-D Deficiency, Fractures)   Vitamin B12   TSH   Neck pain   Chronic, left sided neck pain. No red flags on exam. Can continue lidocaine patches as needed. Stretches given to do daily. If not improving, will get imaging.       Multiple joint pain   She has been having multiple joint pain in her fingers, wrists, elbows, and hips bilaterally. Concern for arthritis vs perimenopause symptoms. Check CMP, CBC, TSH, vitamin B12, ANA, and RF today. Can continue tylenol or ibuprofen as needed for pain.  Relevant Orders   CBC with Differential/Platelet   Comprehensive metabolic panel with GFR   ANA w/Reflex   Rheumatoid Factor   Mixed hyperlipidemia   Chronic, stable. Check CMP, CBC, lipid panel today.       Relevant Orders   CBC with Differential/Platelet   Comprehensive metabolic panel with GFR   Lipid panel   Perimenopause   She is experiencing fatigue, anxiety, and multiple joint pain. Menses are still regular. She has been taking an estroven and aswhaghanda supplement over the counter. She can continue these. She declines prescription medication at this point.       Routine general medical examination at a health care facility - Primary   Health maintenance reviewed and updated. Discussed nutrition, exercise.  Follow-up 1 year.        Other Visit Diagnoses       Fatigue, unspecified type       Relevant Orders   CBC with Differential/Platelet   Comprehensive metabolic  panel with GFR   VITAMIN D 25 Hydroxy (Vit-D Deficiency, Fractures)   Vitamin B12   TSH     Encounter for screening mammogram for malignant neoplasm of breast       Mammogram ordered today.   Relevant Orders   MM 3D SCREENING MAMMOGRAM BILATERAL BREAST        Follow up plan: Return in about 1 year (around 11/17/2024) for CPE.   LABORATORY TESTING:  - Pap smear: up to date  IMMUNIZATIONS:   - Tdap: Tetanus vaccination status reviewed: last tetanus booster within 10 years. - Influenza: Postponed to flu season - Pneumovax: Not applicable - Prevnar: Not applicable - HPV: Not applicable - Shingrix vaccine: Not applicable  SCREENING: -Mammogram: Ordered today  - Colonoscopy: Up to date  - Bone Density: Not applicable   PATIENT COUNSELING:   Advised to take 1 mg of folate supplement per day if capable of pregnancy.   Sexuality: Discussed sexually transmitted diseases, partner selection, use of condoms, avoidance of unintended pregnancy  and contraceptive alternatives.   Advised to avoid cigarette smoking.  I discussed with the patient that most people either abstain from alcohol or drink within safe limits (<=14/week and <=4 drinks/occasion for males, <=7/weeks and <= 3 drinks/occasion for females) and that the risk for alcohol disorders and other health effects rises proportionally with the number of drinks per week and how often a drinker exceeds daily limits.  Discussed cessation/primary prevention of drug use and availability of treatment for abuse.   Diet: Encouraged to adjust caloric intake to maintain  or achieve ideal body weight, to reduce intake of dietary saturated fat and total fat, to limit sodium intake by avoiding high sodium foods and not adding table salt, and to maintain adequate dietary potassium and calcium preferably from fresh fruits, vegetables, and low-fat dairy products.    stressed the importance of regular exercise  Injury prevention: Discussed safety  belts, safety helmets, smoke detector, smoking near bedding or upholstery.   Dental health: Discussed importance of regular tooth brushing, flossing, and dental visits.    NEXT PREVENTATIVE PHYSICAL DUE IN 1 YEAR. Return in about 1 year (around 11/17/2024) for CPE.  Preslee Regas A Levi Crass

## 2023-11-18 NOTE — Assessment & Plan Note (Addendum)
 BMI 36.5. Recommend focus on nutrition and exercise. She has been trying to change her diet. Check A1c, TSH, and vitamin D today.

## 2023-11-19 LAB — RHEUMATOID FACTOR: Rheumatoid fact SerPl-aCnc: <10 [IU]/mL (ref <14)

## 2023-11-19 LAB — ANA W/REFLEX: Anti Nuclear Antibody (ANA): NEGATIVE

## 2023-12-11 ENCOUNTER — Inpatient Hospital Stay (HOSPITAL_BASED_OUTPATIENT_CLINIC_OR_DEPARTMENT_OTHER): Admission: RE | Admit: 2023-12-11 | Source: Ambulatory Visit | Admitting: Radiology

## 2023-12-15 ENCOUNTER — Ambulatory Visit (HOSPITAL_BASED_OUTPATIENT_CLINIC_OR_DEPARTMENT_OTHER)
Admission: RE | Admit: 2023-12-15 | Discharge: 2023-12-15 | Disposition: A | Discharge status: Home / Self Care | Source: Ambulatory Visit | Attending: Nurse Practitioner | Admitting: Nurse Practitioner

## 2023-12-15 ENCOUNTER — Encounter (HOSPITAL_BASED_OUTPATIENT_CLINIC_OR_DEPARTMENT_OTHER): Payer: Self-pay | Admitting: Radiology

## 2023-12-15 DIAGNOSIS — Z1231 Encounter for screening mammogram for malignant neoplasm of breast: Secondary | ICD-10-CM

## 2023-12-17 ENCOUNTER — Telehealth: Payer: Self-pay | Admitting: Nurse Practitioner

## 2023-12-17 NOTE — Telephone Encounter (Signed)
 Copied from CRM [id number]. Topic: General - Call Back - No Documentation >> Dec 17, 2023 10:00 AM Marlan Silva wrote: Reason for CRM: Patient called in stating that she had a missed call from the practice. I did not see any documentation in the chart and she is unsure as to who called. Patient is requesting a call back to the number on file.

## 2023-12-17 NOTE — Telephone Encounter (Signed)
 I called patient and notified her of recent mammogram results.

## 2024-02-17 ENCOUNTER — Encounter: Payer: Self-pay | Admitting: Nurse Practitioner

## 2024-02-17 ENCOUNTER — Ambulatory Visit: Admitting: Nurse Practitioner

## 2024-02-17 VITALS — BP 122/70 | HR 78 | Temp 98.1°F | Ht 61.0 in | Wt 196.6 lb

## 2024-02-17 DIAGNOSIS — R058 Other specified cough: Secondary | ICD-10-CM | POA: Diagnosis not present

## 2024-02-17 DIAGNOSIS — H6690 Otitis media, unspecified, unspecified ear: Secondary | ICD-10-CM | POA: Diagnosis not present

## 2024-02-17 MED ORDER — PROMETHAZINE-DM 6.25-15 MG/5ML PO SYRP: 5.0000 mL | ORAL_SOLUTION | Freq: Four times a day (QID) | ORAL | 0 refills | Status: AC | PRN

## 2024-02-17 MED ORDER — AZITHROMYCIN 250 MG PO TABS: ORAL_TABLET | ORAL | 0 refills | Status: AC

## 2024-02-17 NOTE — Progress Notes (Addendum)
 Acute Office Visit  Subjective:     Patient ID: Barbara Coffey, female    DOB: Jan 17, 1976, 48 y.o.   MRN: [id number]  Chief Complaint  Patient presents with   Cough    X3ws first started out with mucus now dry; headaches but no other symptoms; using tylenol /ibuprofen  for headaches and theraflu for cough   HPI:  Discussed the use of AI scribe software for clinical note transcription with the patient, who gave verbal consent to proceed.  History of Present Illness   Barbara Coffey is a 48 year old female who presents with a persistent dry cough and ear pain.  Three weeks ago, she developed cold symptoms with excessive mucus production and cough, which has now become a persistent dry cough. She experiences pressure when coughing, occasional shortness of breath, and a sensation of wheezing during respiration. She has not taken any medication for the current dry cough.  She has a history of ear infections and is currently experiencing ear pain, primarily affecting the left ear.  She denies fever, headaches, stuffy nose, runny nose, or neck pain. The cough is primarily felt in her chest area.      ROS See pertinent positives and negatives per HPI.     Objective:    BP 122/70   Pulse 78   Temp 98.1 F (36.7 C)   Ht 5' 1 (1.549 m)   Wt 196 lb 9.6 oz (89.2 kg)   SpO2 98%   BMI 37.15 kg/m    Physical Exam Vitals and nursing note reviewed.  Constitutional:      General: She is not in acute distress.    Appearance: Normal appearance.  HENT:     Head: Normocephalic.     Right Ear: Tympanic membrane, ear canal and external ear normal.     Left Ear: External ear normal. Tympanic membrane is erythematous.     Nose:     Right Sinus: No maxillary sinus tenderness or frontal sinus tenderness.     Left Sinus: No maxillary sinus tenderness or frontal sinus tenderness.     Mouth/Throat:     Mouth: Mucous membranes are moist.     Pharynx: Posterior oropharyngeal  erythema present. No oropharyngeal exudate.  Eyes:     Conjunctiva/sclera: Conjunctivae normal.  Cardiovascular:     Rate and Rhythm: Normal rate and regular rhythm.     Pulses: Normal pulses.     Heart sounds: Normal heart sounds.  Pulmonary:     Effort: Pulmonary effort is normal.     Breath sounds: Normal breath sounds.     Comments: Coughing frequently during visit  Musculoskeletal:     Cervical back: Normal range of motion.  Skin:    General: Skin is warm.  Neurological:     General: No focal deficit present.     Mental Status: She is alert and oriented to person, place, and time.  Psychiatric:        Mood and Affect: Mood normal.        Behavior: Behavior normal.        Thought Content: Thought content normal.        Judgment: Judgment normal.       Assessment & Plan:   Post Viral Cough Syndrome She has experienced a dry cough for three weeks following a cold, likely a residual effect. Shortness of breath and wheezing are present. Prescribe promethazine -dm cough syrup every four hours as needed, with caution for drowsiness. Advise increased fluid intake and  hot tea consumption.  Otitis Media   Ear pain with redness indicates infection. Start zpak 2 tablets today, then 1 tablet daily until gone      Meds ordered this encounter  Medications   promethazine -dextromethorphan (PROMETHAZINE -DM) 6.25-15 MG/5ML syrup    Sig: Take 5 mLs by mouth 4 (four) times daily as needed.    Dispense:  118 mL    Refill:  0   azithromycin (ZITHROMAX) 250 MG tablet    Sig: Take 2 tablets on day 1, then 1 tablet daily on days 2 through 5    Dispense:  6 tablet    Refill:  0    Return if symptoms worsen or fail to improve.  Tinnie DELENA Harada, NP

## 2024-02-17 NOTE — Patient Instructions (Signed)
 It was great to see you!  Start zpak antibiotic 2 tablets today, then 1 tablet daily until gone  Start cough syrup every 4 hours as needed   Drink plenty of fluids and hot tea  Let's follow-up if your symptoms worsen or don't improve   Take care,  Tinnie Harada, NP
